# Patient Record
Sex: Male | Born: 1970 | Race: White | Hispanic: No | Marital: Married | Smoking: Current every day smoker
Health system: Southern US, Community
[De-identification: ages and names within clinical notes are randomized; demographics above are authoritative.]

## PROBLEM LIST (undated history)

## (undated) DIAGNOSIS — E7800 Pure hypercholesterolemia, unspecified: Secondary | ICD-10-CM

## (undated) DIAGNOSIS — Q2112 Patent foramen ovale: Secondary | ICD-10-CM

## (undated) DIAGNOSIS — Q211 Atrial septal defect: Secondary | ICD-10-CM

## (undated) DIAGNOSIS — F329 Major depressive disorder, single episode, unspecified: Secondary | ICD-10-CM

## (undated) DIAGNOSIS — I639 Cerebral infarction, unspecified: Secondary | ICD-10-CM

## (undated) DIAGNOSIS — F17200 Nicotine dependence, unspecified, uncomplicated: Secondary | ICD-10-CM

## (undated) DIAGNOSIS — F32A Depression, unspecified: Secondary | ICD-10-CM

## (undated) HISTORY — DX: Major depressive disorder, single episode, unspecified: F32.9

## (undated) HISTORY — PX: VASECTOMY: SHX75

## (undated) HISTORY — DX: Depression, unspecified: F32.A

## (undated) HISTORY — PX: MOUTH SURGERY: SHX715

## (undated) HISTORY — PX: OTHER SURGICAL HISTORY: SHX169

## (undated) HISTORY — PX: CARPAL TUNNEL RELEASE: SHX101

## (undated) HISTORY — DX: Cerebral infarction, unspecified: I63.9

## (undated) HISTORY — DX: Nicotine dependence, unspecified, uncomplicated: F17.200

---

## 2000-09-05 ENCOUNTER — Emergency Department (HOSPITAL_COMMUNITY): Admission: EM | Admit: 2000-09-05 | Discharge: 2000-09-06 | Payer: Self-pay | Admitting: Emergency Medicine

## 2001-09-19 ENCOUNTER — Emergency Department (HOSPITAL_COMMUNITY): Admission: EM | Admit: 2001-09-19 | Discharge: 2001-09-19 | Payer: Self-pay

## 2002-06-27 ENCOUNTER — Emergency Department (HOSPITAL_COMMUNITY): Admission: EM | Admit: 2002-06-27 | Discharge: 2002-06-27 | Payer: Self-pay | Admitting: Emergency Medicine

## 2002-06-27 ENCOUNTER — Encounter: Payer: Self-pay | Admitting: *Deleted

## 2007-01-09 ENCOUNTER — Ambulatory Visit: Payer: Self-pay | Admitting: Internal Medicine

## 2007-02-13 ENCOUNTER — Encounter: Payer: Self-pay | Admitting: Nurse Practitioner

## 2007-02-13 DIAGNOSIS — D239 Other benign neoplasm of skin, unspecified: Secondary | ICD-10-CM | POA: Insufficient documentation

## 2008-06-09 ENCOUNTER — Emergency Department (HOSPITAL_COMMUNITY): Admission: EM | Admit: 2008-06-09 | Discharge: 2008-06-09 | Payer: Self-pay | Admitting: Emergency Medicine

## 2008-12-09 ENCOUNTER — Emergency Department (HOSPITAL_COMMUNITY): Admission: EM | Admit: 2008-12-09 | Discharge: 2008-12-09 | Payer: Self-pay | Admitting: Emergency Medicine

## 2011-03-28 LAB — COMPREHENSIVE METABOLIC PANEL
ALT: 31 U/L (ref 0–53)
AST: 21 U/L (ref 0–37)
Albumin: 4.1 g/dL (ref 3.5–5.2)
Calcium: 9.3 mg/dL (ref 8.4–10.5)
GFR calc Af Amer: >60 mL/min (ref >60)
Sodium: 136 mEq/L (ref 135–145)
Total Protein: 7.7 g/dL (ref 6.0–8.3)

## 2011-03-28 LAB — URINALYSIS, ROUTINE W REFLEX MICROSCOPIC
Bilirubin Urine: NEGATIVE
Glucose, UA: NEGATIVE mg/dL
Hgb urine dipstick: NEGATIVE
Ketones, ur: NEGATIVE mg/dL
Nitrite: NEGATIVE
Protein, ur: NEGATIVE mg/dL
Specific Gravity, Urine: 1.007 (ref 1.005–1.030)
Urobilinogen, UA: 0.2 mg/dL (ref 0.0–1.0)
pH: 7 (ref 5.0–8.0)

## 2011-03-28 LAB — OCCULT BLOOD X 1 CARD TO LAB, STOOL: Fecal Occult Bld: POSITIVE

## 2011-03-28 LAB — CBC
MCHC: 34 g/dL (ref 30.0–36.0)
RDW: 12.2 % (ref 11.5–15.5)

## 2014-11-10 ENCOUNTER — Other Ambulatory Visit: Payer: Self-pay | Admitting: Orthopedic Surgery

## 2014-11-10 ENCOUNTER — Encounter (HOSPITAL_COMMUNITY)
Admission: RE | Admit: 2014-11-10 | Discharge: 2014-11-10 | Disposition: A | Discharge status: Home / Self Care | Payer: BLUE CROSS/BLUE SHIELD | Source: Ambulatory Visit | Attending: Orthopedic Surgery | Admitting: Orthopedic Surgery

## 2014-11-10 DIAGNOSIS — B999 Unspecified infectious disease: Secondary | ICD-10-CM | POA: Insufficient documentation

## 2014-11-10 MED ORDER — VANCOMYCIN HCL IN DEXTROSE 1-5 GM/200ML-% IV SOLN
1000.0000 mg | Freq: Once | INTRAVENOUS | Status: DC
  Administered 2014-11-10: 1000 mg via INTRAVENOUS

## 2014-11-11 ENCOUNTER — Encounter (HOSPITAL_COMMUNITY)
Admission: RE | Admit: 2014-11-11 | Discharge: 2014-11-11 | Disposition: A | Discharge status: Home / Self Care | Payer: BLUE CROSS/BLUE SHIELD | Source: Ambulatory Visit | Attending: Orthopedic Surgery | Admitting: Orthopedic Surgery

## 2014-11-11 DIAGNOSIS — B999 Unspecified infectious disease: Secondary | ICD-10-CM | POA: Diagnosis not present

## 2014-11-11 MED ORDER — VANCOMYCIN HCL IN DEXTROSE 1-5 GM/200ML-% IV SOLN
1000.0000 mg | Freq: Once | INTRAVENOUS | Status: AC
  Administered 2014-11-11: 1000 mg via INTRAVENOUS

## 2017-06-24 DIAGNOSIS — I639 Cerebral infarction, unspecified: Secondary | ICD-10-CM

## 2017-06-24 HISTORY — DX: Cerebral infarction, unspecified: I63.9

## 2017-06-26 ENCOUNTER — Encounter (HOSPITAL_COMMUNITY): Payer: Self-pay | Admitting: Emergency Medicine

## 2017-06-26 ENCOUNTER — Observation Stay (HOSPITAL_COMMUNITY)
Admission: EM | Admit: 2017-06-26 | Discharge: 2017-06-28 | Disposition: A | Discharge status: Home / Self Care | Payer: 59 | Attending: Internal Medicine | Admitting: Internal Medicine

## 2017-06-26 ENCOUNTER — Emergency Department (HOSPITAL_COMMUNITY): Payer: 59

## 2017-06-26 DIAGNOSIS — F418 Other specified anxiety disorders: Secondary | ICD-10-CM | POA: Diagnosis not present

## 2017-06-26 DIAGNOSIS — R030 Elevated blood-pressure reading, without diagnosis of hypertension: Secondary | ICD-10-CM | POA: Diagnosis not present

## 2017-06-26 DIAGNOSIS — F17210 Nicotine dependence, cigarettes, uncomplicated: Secondary | ICD-10-CM | POA: Diagnosis not present

## 2017-06-26 DIAGNOSIS — I639 Cerebral infarction, unspecified: Principal | ICD-10-CM | POA: Diagnosis present

## 2017-06-26 DIAGNOSIS — Z79899 Other long term (current) drug therapy: Secondary | ICD-10-CM | POA: Insufficient documentation

## 2017-06-26 DIAGNOSIS — R4182 Altered mental status, unspecified: Secondary | ICD-10-CM | POA: Diagnosis present

## 2017-06-26 DIAGNOSIS — Z8673 Personal history of transient ischemic attack (TIA), and cerebral infarction without residual deficits: Secondary | ICD-10-CM | POA: Insufficient documentation

## 2017-06-26 DIAGNOSIS — R410 Disorientation, unspecified: Secondary | ICD-10-CM | POA: Diagnosis not present

## 2017-06-26 HISTORY — DX: Pure hypercholesterolemia, unspecified: E78.00

## 2017-06-26 LAB — CBC
HCT: 50.7 % (ref 39.0–52.0)
HEMOGLOBIN: 17.9 g/dL — AB (ref 13.0–17.0)
MCH: 33 pg (ref 26.0–34.0)
MCHC: 35.3 g/dL (ref 30.0–36.0)
MCV: 93.4 fL (ref 78.0–100.0)
Platelets: 208 10*3/uL (ref 150–400)
RBC: 5.43 MIL/uL (ref 4.22–5.81)
RDW: 12.6 % (ref 11.5–15.5)
WBC: 7.8 10*3/uL (ref 4.0–10.5)

## 2017-06-26 LAB — COMPREHENSIVE METABOLIC PANEL
ALBUMIN: 3.9 g/dL (ref 3.5–5.0)
ALK PHOS: 78 U/L (ref 38–126)
ALT: 29 U/L (ref 17–63)
ANION GAP: 7 (ref 5–15)
AST: 30 U/L (ref 15–41)
BUN: 12 mg/dL (ref 6–20)
CHLORIDE: 105 mmol/L (ref 101–111)
CO2: 22 mmol/L (ref 22–32)
Calcium: 8.8 mg/dL — ABNORMAL LOW (ref 8.9–10.3)
Creatinine, Ser: 1.05 mg/dL (ref 0.61–1.24)
GFR calc non Af Amer: >60 mL/min (ref >60)
GLUCOSE: 102 mg/dL — AB (ref 65–99)
Potassium: 4.3 mmol/L (ref 3.5–5.1)
SODIUM: 134 mmol/L — AB (ref 135–145)
Total Bilirubin: 1.1 mg/dL (ref 0.3–1.2)
Total Protein: 7.3 g/dL (ref 6.5–8.1)

## 2017-06-26 LAB — CBG MONITORING, ED: Glucose-Capillary: 132 mg/dL — ABNORMAL HIGH (ref 65–99)

## 2017-06-26 NOTE — ED Provider Notes (Signed)
[place] DEPT Provider Note   CSN: [id number] Arrival date & time: 06/26/17  1557     History   Chief Complaint Chief Complaint  Patient presents with  . Altered Mental Status    HPI Steven Herrera is a 47 y.o. male.  HPI Pt is worried that he had a stroke.  He noticed the symptoms yesterday around 10am.  He has been having difficulty with fine motor skills and depth perception.  For example he was trying to put something on the table and he kept reaching past it or below the table. 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 He felt like his speech was off yesterday but none today.  No vision issues.   Past Medical History:  Diagnosis Date  . High cholesterol     Patient Active Problem List   Diagnosis Date Noted  . NEVUS 02/13/2007    Past Surgical History:  Procedure Laterality Date  . CARPAL TUNNEL RELEASE    . MOUTH SURGERY    . spider bite    . VASECTOMY         Home Medications    Prior to Admission medications   Not on File    Family History No family history on file.  Social History Social History   Tobacco Use  . Smoking status: Current Every Day Smoker    Types: Cigarettes  . Smokeless tobacco: Never Used  Substance Use Topics  . Alcohol use: Yes  . Drug use: Not on file     Allergies   Penicillins   Review of Systems Review of Systems  All other systems reviewed and are negative.    Physical Exam Updated Vital Signs BP (!) 142/105 (BP Location: Right Arm)   Pulse 63   Temp 98.3 F (36.8 C) (Oral)   Resp 14   Ht 1.854 m (6\' 1" )   Wt 97.5 kg (215 lb)   SpO2 96%   BMI 28.37 kg/m   Physical Exam  Constitutional: He is oriented to person, place, and time. He appears well-developed and well-nourished. No distress.  HENT:  Head: Normocephalic and atraumatic.  Right Ear: External ear normal.  Left Ear: External ear normal.  Mouth/Throat: Oropharynx is clear and moist.    Eyes: Conjunctivae are normal. Right eye exhibits no discharge. Left eye exhibits no discharge. No scleral icterus.  Neck: Neck supple. No tracheal deviation present.  Cardiovascular: Normal rate, regular rhythm and intact distal pulses.  Pulmonary/Chest: Effort normal and breath sounds normal. No stridor. No respiratory distress. He has no wheezes. He has no rales.  Abdominal: Soft. Bowel sounds are normal. He exhibits no distension. There is no tenderness. There is no rebound and no guarding.  Musculoskeletal: He exhibits no edema or tenderness.  Neurological: He is alert and oriented to person, place, and time. He has normal strength. No cranial nerve deficit (No facial droop, extraocular movements intact, tongue midline ) or sensory deficit. He exhibits normal muscle tone. He displays no seizure activity. Coordination normal.  No pronator drift bilateral upper extrem, able to hold both legs off bed for 5 seconds, sensation intact in all extremities, no visual field cuts, no left or right sided neglect, normal finger-nose exam bilaterally, no nystagmus noted   Skin: Skin is warm and dry. No rash noted.  Psychiatric: He has a normal mood and affect.  Nursing note and vitals reviewed.    ED Treatments / Results  Labs (all labs  ordered are listed, but only abnormal results are displayed) Labs Reviewed  COMPREHENSIVE METABOLIC PANEL - Abnormal; Notable for the following components:      Result Value   Sodium 134 (*)    Glucose, Bld 102 (*)    Calcium 8.8 (*)    All other components within normal limits  CBC - Abnormal; Notable for the following components:   Hemoglobin 17.9 (*)    All other components within normal limits  CBG MONITORING, ED - Abnormal; Notable for the following components:   Glucose-Capillary 132 (*)    All other components within normal limits  ETHANOL    Radiology Ct Head Wo Contrast  Result Date: 06/26/2017 CLINICAL DATA:  Confusion.  Difficult hand eye  coordination.  TIA. EXAM: CT HEAD WITHOUT CONTRAST TECHNIQUE: Contiguous axial images were obtained from the base of the skull through the vertex without intravenous contrast. COMPARISON:  None. FINDINGS: Brain: Vague low-attenuation change demonstrated in the caudate and globus pallidus with extension to the subinsular region on the left. This suggests early changes of acute infarct. MRI may provide additional characterization if clinically indicated. 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 No mass effect or midline shift. No abnormal extra-axial fluid collections. No ventricular dilatation. No acute intracranial hemorrhage. Vascular: There may be slightly asymmetrical increased density in the left middle cerebral artery. This could indicate vascular thrombosis. Skull: Normal. Negative for fracture or focal lesion. Sinuses/Orbits: No acute finding. Other: None. IMPRESSION: 1. A low-attenuation changes in the left basal ganglia and subinsular region with possible slightly increased asymmetrical density in the left middle cerebral artery. Changes suggest acute ischemia with possible middle cerebral artery vascular thrombosis. MRI may provide additional characterization if clinically indicated. 2. No acute intracranial hemorrhage. 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 Electronically Signed   By: Lucienne Capers M.D.   On: 06/26/2017 22:48    Procedures Procedures (including critical care time)  Medications Ordered in ED Medications - No data to display   Initial Impression / Assessment and Plan / ED Course  I have reviewed the triage vital signs and the nursing notes.  Pertinent labs & imaging results that were available during my care of the patient were reviewed by me and considered in my medical decision making (see chart for details).   Patient presented to the emergency room for evaluation of difficulty with his coordination yesterday.  Patient's symptoms seem to improve however  they did not completely resolved.  He was worried that he might of had a stroke.  Patient's CAT scan shows an old stroke as well as a probable new stroke.  He is well outside any acute treatment window.  I will consult with medical service for admission and further treatment.  Final Clinical Impressions(s) / ED Diagnoses   Final diagnoses:  Cerebrovascular accident (CVA), unspecified mechanism ([place])      Dorie Rank, MD 06/26/17 2328

## 2017-06-26 NOTE — ED Triage Notes (Signed)
Patient reports that yesterday woke up around 10am and was fine then went back to sleep and woke up around noon and had confusion and trouble using cell phone and hand eye coordination.  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

## 2017-06-27 ENCOUNTER — Other Ambulatory Visit: Payer: Self-pay

## 2017-06-27 ENCOUNTER — Observation Stay (HOSPITAL_BASED_OUTPATIENT_CLINIC_OR_DEPARTMENT_OTHER): Payer: 59

## 2017-06-27 ENCOUNTER — Observation Stay (HOSPITAL_COMMUNITY): Payer: 59

## 2017-06-27 ENCOUNTER — Encounter (HOSPITAL_COMMUNITY): Payer: Self-pay | Admitting: Family Medicine

## 2017-06-27 ENCOUNTER — Observation Stay (HOSPITAL_COMMUNITY): Admit: 2017-06-27 | Payer: 59

## 2017-06-27 DIAGNOSIS — I639 Cerebral infarction, unspecified: Secondary | ICD-10-CM

## 2017-06-27 DIAGNOSIS — Z79899 Other long term (current) drug therapy: Secondary | ICD-10-CM | POA: Diagnosis not present

## 2017-06-27 DIAGNOSIS — R030 Elevated blood-pressure reading, without diagnosis of hypertension: Secondary | ICD-10-CM | POA: Diagnosis not present

## 2017-06-27 DIAGNOSIS — F418 Other specified anxiety disorders: Secondary | ICD-10-CM | POA: Diagnosis not present

## 2017-06-27 DIAGNOSIS — Z8673 Personal history of transient ischemic attack (TIA), and cerebral infarction without residual deficits: Secondary | ICD-10-CM | POA: Diagnosis not present

## 2017-06-27 DIAGNOSIS — F17210 Nicotine dependence, cigarettes, uncomplicated: Secondary | ICD-10-CM | POA: Diagnosis not present

## 2017-06-27 DIAGNOSIS — R410 Disorientation, unspecified: Secondary | ICD-10-CM | POA: Diagnosis not present

## 2017-06-27 LAB — LIPID PANEL
CHOLESTEROL: 204 mg/dL — AB (ref 0–200)
HDL: 25 mg/dL — AB (ref >40)
LDL Cholesterol: UNDETERMINED mg/dL (ref 0–99)
TRIGLYCERIDES: 410 mg/dL — AB (ref <150)
Total CHOL/HDL Ratio: 8.2 RATIO
VLDL: UNDETERMINED mg/dL (ref 0–40)

## 2017-06-27 LAB — ETHANOL

## 2017-06-27 LAB — HEMOGLOBIN A1C
HEMOGLOBIN A1C: 5.1 % (ref 4.8–5.6)
Mean Plasma Glucose: 99.67 mg/dL

## 2017-06-27 MED ORDER — ACETAMINOPHEN 650 MG RE SUPP: 650.0000 mg | RECTAL | Status: DC | PRN

## 2017-06-27 MED ORDER — ENOXAPARIN SODIUM 40 MG/0.4ML ~~LOC~~ SOLN
40.0000 mg | SUBCUTANEOUS | Status: DC
  Administered 2017-06-27 – 2017-06-28 (×2): 40 mg via SUBCUTANEOUS
  Filled 2017-06-27 (×2): qty 0.4

## 2017-06-27 MED ORDER — STROKE: EARLY STAGES OF RECOVERY BOOK
Freq: Once | Status: AC
  Administered 2017-06-27: 03:00:00
  Filled 2017-06-27: qty 1

## 2017-06-27 MED ORDER — ASPIRIN 325 MG PO TABS
325.0000 mg | ORAL_TABLET | Freq: Every day | ORAL | Status: DC
  Administered 2017-06-27 – 2017-06-28 (×3): 325 mg via ORAL
  Filled 2017-06-27 (×3): qty 1

## 2017-06-27 MED ORDER — SENNOSIDES-DOCUSATE SODIUM 8.6-50 MG PO TABS: 1.0000 | ORAL_TABLET | Freq: Every evening | ORAL | Status: DC | PRN

## 2017-06-27 MED ORDER — CLONAZEPAM 1 MG PO TABS
1.0000 mg | ORAL_TABLET | Freq: Two times a day (BID) | ORAL | Status: DC
  Administered 2017-06-27 – 2017-06-28 (×3): 1 mg via ORAL
  Filled 2017-06-27: qty 1
  Filled 2017-06-27: qty 2
  Filled 2017-06-27: qty 1

## 2017-06-27 MED ORDER — SODIUM CHLORIDE 0.9 % IV SOLN
INTRAVENOUS | Status: DC
  Administered 2017-06-27: 01:00:00 via INTRAVENOUS

## 2017-06-27 MED ORDER — ATORVASTATIN CALCIUM 80 MG PO TABS
80.0000 mg | ORAL_TABLET | Freq: Every day | ORAL | Status: DC
  Administered 2017-06-27: 80 mg via ORAL
  Filled 2017-06-27: qty 1

## 2017-06-27 MED ORDER — ACETAMINOPHEN 160 MG/5ML PO SOLN: 650.0000 mg | ORAL | Status: DC | PRN

## 2017-06-27 MED ORDER — ACETAMINOPHEN 325 MG PO TABS: 650.0000 mg | ORAL_TABLET | ORAL | Status: DC | PRN

## 2017-06-27 MED ORDER — PAROXETINE HCL 20 MG PO TABS
40.0000 mg | ORAL_TABLET | Freq: Every day | ORAL | Status: DC
  Administered 2017-06-27 – 2017-06-28 (×2): 40 mg via ORAL
  Filled 2017-06-27 (×2): qty 2

## 2017-06-27 MED ORDER — BUPROPION HCL ER (XL) 150 MG PO TB24
150.0000 mg | ORAL_TABLET | Freq: Every day | ORAL | Status: DC
  Administered 2017-06-27 – 2017-06-28 (×2): 150 mg via ORAL
  Filled 2017-06-27 (×2): qty 1

## 2017-06-27 NOTE — Consult Note (Signed)
Requesting Physician: Dr. Zigmund Daniel    Chief Complaint: Strokelike symptoms  History obtained from:  Patient     HPI:                                                                                                                                         Steven Herrera is an 47 y.o. male with history of hypercholesterolemia, carpal tunnel, depression and anxiety.  Patient presented to the emergency department on 3 January of 2018 for difficulty with hand eye coordination.  She apparently took a nap on 26 June 2007 but upon waking he had some confusion, he noticed some difficulty using the cell phone as far as difficulty pushing the numbers and using it correctly.  However this did make him nervous as his family has a history of strokes that she reported to the emergency department.  Upon entering the patient's blood pressure was stable, chemistry panel and CBC were stable however CT of the head demonstrated a low-attenuation in the left basal ganglia which was suspicious for a possible acute ischemic infarct.  This is followed up by an MRI of the brain which did not show any acute/subacute nonhemorrhagic infarct involving the left lentiform nucleus and internal capsule along with a left caudate head.  MRA was nonrevealing.  Patient's LDL was unable to be calculated secondary to elevated triglycerides.   Date last known well: Date: 06/25/2017 Time last known well: Time: 10:00 tPA Given: No: Out of the window   Modified Rankin: Rankin Score=0    Past Medical History:  Diagnosis Date  . High cholesterol     Past Surgical History:  Procedure Laterality Date  . CARPAL TUNNEL RELEASE    . MOUTH SURGERY    . spider bite    . VASECTOMY      Family History  Problem Relation Age of Onset  . Stroke Father        disabling CVA in early-mid 50's   Social History:  reports that he has been smoking cigarettes.  he has never used smokeless tobacco. He reports that he drinks alcohol. His drug  history is not on file.  Allergies:  Allergies  Allergen Reactions  . Ibuprofen Diarrhea  . Penicillins     Has patient had a PCN reaction causing immediate rash, facial/tongue/throat swelling, SOB or lightheadedness with hypotension: Yes Has patient had a PCN reaction causing severe rash involving mucus membranes or skin necrosis: no Has patient had a PCN reaction that required hospitalization: no Has patient had a PCN reaction occurring within the last 10 years: no If all of the above answers are "NO", then may proceed with Cephalosporin use.    Medications:  Current Facility-Administered Medications  Medication Dose Route Frequency Provider Last Rate Last Dose  . 0.9 %  sodium chloride infusion   Intravenous Continuous Opyd, Ilene Qua, MD 100 mL/hr at 06/27/17 0103    . acetaminophen (TYLENOL) tablet 650 mg  650 mg Oral Q4H PRN Opyd, Ilene Qua, MD       Or  . acetaminophen (TYLENOL) solution 650 mg  650 mg Per Tube Q4H PRN Opyd, Ilene Qua, MD       Or  . acetaminophen (TYLENOL) suppository 650 mg  650 mg Rectal Q4H PRN Opyd, Ilene Qua, MD      . aspirin tablet 325 mg  325 mg Oral Daily Opyd, Ilene Qua, MD   325 mg at 06/27/17 1056  . buPROPion (WELLBUTRIN XL) 24 hr tablet 150 mg  150 mg Oral Daily Opyd, Ilene Qua, MD   150 mg at 06/27/17 1057  . clonazePAM (KLONOPIN) tablet 1 mg  1 mg Oral BID Opyd, Ilene Qua, MD   1 mg at 06/27/17 1056  . enoxaparin (LOVENOX) injection 40 mg  40 mg Subcutaneous Q24H Opyd, Ilene Qua, MD   40 mg at 06/27/17 0850  . PARoxetine (PAXIL) tablet 40 mg  40 mg Oral Daily Opyd, Ilene Qua, MD   40 mg at 06/27/17 1056  . senna-docusate (Senokot-S) tablet 1 tablet  1 tablet Oral QHS PRN Opyd, Ilene Qua, MD       Current Outpatient Medications  Medication Sig Dispense Refill  . buPROPion (WELLBUTRIN XL) 150 MG 24 hr tablet Take 150 mg by  mouth daily. noonish    . clonazePAM (KLONOPIN) 1 MG tablet Take 1 mg by mouth 2 (two) times daily.    Marland Kitchen PARoxetine (PAXIL) 40 MG tablet Take 40 mg by mouth daily.  0     ROS:                                                                                                                                       History obtained from the patient  General ROS: negative for - chills, fatigue, fever, night sweats, weight gain or weight loss Psychological ROS: negative for - behavioral disorder, hallucinations, memory difficulties, mood swings or suicidal ideation Ophthalmic ROS: negative for - blurry vision, double vision, eye pain or loss of vision ENT ROS: negative for - epistaxis, nasal discharge, oral lesions, sore throat, tinnitus or vertigo Allergy and Immunology ROS: negative for - hives or itchy/watery eyes Hematological and Lymphatic ROS: negative for - bleeding problems, bruising or swollen lymph nodes Endocrine ROS: negative for - galactorrhea, hair pattern changes, polydipsia/polyuria or temperature intolerance Respiratory ROS: negative for - cough, hemoptysis, shortness of breath or wheezing Cardiovascular ROS: negative for - chest pain, dyspnea on exertion, edema or irregular heartbeat Gastrointestinal ROS: negative for - abdominal pain, diarrhea, hematemesis, nausea/vomiting or stool incontinence Genito-Urinary ROS: negative for - dysuria, hematuria, incontinence or urinary frequency/urgency  Musculoskeletal ROS: negative for - joint swelling or muscular weakness Neurological ROS: as noted in HPI Dermatological ROS: negative for rash and skin lesion changes  Neurologic Examination:                                                                                                      Blood pressure 121/86, pulse 60, temperature 98.3 F (36.8 C), resp. rate 16, height 6\' 1"  (1.854 m), weight 97.5 kg (215 lb), SpO2 100 %.  HEENT-  Normocephalic, no lesions, without obvious abnormality.   Normal external eye and conjunctiva.  Normal TM's bilaterally.  Normal auditory canals and external ears. Normal external nose, mucus membranes and septum.  Normal pharynx. Cardiovascular- S1, S2 normal, pulses palpable throughout   Lungs- chest clear, no wheezing, rales, normal symmetric air entry Abdomen- normal findings: bowel sounds normal Extremities- no edema Lymph-no adenopathy palpable Musculoskeletal-no joint tenderness, deformity or swelling Skin-warm and dry, no hyperpigmentation, vitiligo, or suspicious lesions  Neurological Examination Mental Status: Alert, oriented, thought content appropriate.  Speech fluent without evidence of aphasia.  Able to follow 3 step commands without difficulty. Cranial Nerves: II: Discs flat bilaterally; Visual fields grossly normal,  III,IV, VI: ptosis not present, extra-ocular motions intact bilaterally, pupils equal, round, reactive to light and accommodation V,VII: smile symmetric, facial light touch sensation normal bilaterally VIII: hearing normal bilaterally IX,X: uvula rises symmetrically XI: bilateral shoulder shrug XII: midline tongue extension Motor: Right : Upper extremity   5/5    Left:     Upper extremity   5/5  Lower extremity   5/5     Lower extremity   5/5 Tone and bulk:normal tone throughout; no atrophy noted Sensory: Pinprick and light touch intact throughout, bilaterally Deep Tendon Reflexes: 2+ and symmetric throughout Plantars: Right: downgoing   Left: downgoing Cerebellar: normal finger-to-nose, normal rapid alternating movements and normal heel-to-shin test Gait: normal gait and station       Lab Results: Basic Metabolic Panel: Recent Labs  Lab 06/26/17 1631  NA 134*  K 4.3  CL 105  CO2 22  GLUCOSE 102*  BUN 12  CREATININE 1.05  CALCIUM 8.8*    Liver Function Tests: Recent Labs  Lab 06/26/17 1631  AST 30  ALT 29  ALKPHOS 78  BILITOT 1.1  PROT 7.3  ALBUMIN 3.9   No results for input(s):  LIPASE, AMYLASE in the last 168 hours. No results for input(s): AMMONIA in the last 168 hours.  CBC: Recent Labs  Lab 06/26/17 1631  WBC 7.8  HGB 17.9*  HCT 50.7  MCV 93.4  PLT 208    Cardiac Enzymes: No results for input(s): CKTOTAL, CKMB, CKMBINDEX, TROPONINI in the last 168 hours.  Lipid Panel: Recent Labs  Lab 06/27/17 0843  CHOL 204*  TRIG 410*  HDL 25*  CHOLHDL 8.2  VLDL UNABLE TO CALCULATE IF TRIGLYCERIDE OVER 400 mg/dL  LDLCALC UNABLE TO CALCULATE IF TRIGLYCERIDE OVER 400 mg/dL    CBG: Recent Labs  Lab 06/26/17 1611  GLUCAP 132*    Microbiology: No results found for this or any  previous visit.  Coagulation Studies: No results for input(s): LABPROT, INR in the last 72 hours.  Imaging: Ct Head Wo Contrast  Result Date: 06/26/2017 CLINICAL DATA:  Confusion.  Difficult hand eye coordination.  TIA. EXAM: CT HEAD WITHOUT CONTRAST TECHNIQUE: Contiguous axial images were obtained from the base of the skull through the vertex without intravenous contrast. COMPARISON:  None. FINDINGS: Brain: Vague low-attenuation change demonstrated in the caudate and globus pallidus with extension to the subinsular region on the left. This suggests early changes of acute infarct. MRI may provide additional characterization if clinically indicated. 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 No mass effect or midline shift. No abnormal extra-axial fluid collections. No ventricular dilatation. No acute intracranial hemorrhage. Vascular: There may be slightly asymmetrical increased density in the left middle cerebral artery. This could indicate vascular thrombosis. Skull: Normal. Negative for fracture or focal lesion. Sinuses/Orbits: No acute finding. Other: None. IMPRESSION: 1. A low-attenuation changes in the left basal ganglia and subinsular region with possible slightly increased asymmetrical density in the left middle cerebral artery. Changes suggest acute ischemia with possible middle  cerebral artery vascular thrombosis. MRI may provide additional characterization if clinically indicated. 2. No acute intracranial hemorrhage. 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 Electronically Signed   By: Lucienne Capers M.D.   On: 06/26/2017 22:48   Mr Brain Wo Contrast  Result Date: 06/27/2017 CLINICAL DATA:  Neuro deficits, subacute. New onset confusion and abnormal coordination. EXAM: MRI HEAD WITHOUT CONTRAST MRA HEAD WITHOUT CONTRAST TECHNIQUE: Multiplanar, multiecho pulse sequences of the brain and surrounding structures were obtained without intravenous contrast. Angiographic images of the head were obtained using MRA technique without contrast. COMPARISON:  CT head without contrast. FINDINGS: MRI HEAD FINDINGS Brain: The diffusion-weighted images confirm an acute/ subacute infarct involving the left lentiform nucleus and caudate head. The infarct crosses the left internal capsule. No acute hemorrhage or mass lesion is present. Mild periventricular white matter changes are present on the right, slightly advanced for age. No other infarcts are present. The internal auditory canals are within normal limits bilaterally. The brainstem and cerebellum are normal. Vascular: Flow is present in the major intracranial arteries. Skull and upper cervical spine: The skullbase is within normal limits. The craniocervical junction is normal. Marrow signal is within normal limits. Sinuses/Orbits: The paranasal sinuses and mastoid air cells are clear. Globes and orbits are within normal limits. MRA HEAD FINDINGS The internal carotid arteries are within normal limits from the high cervical segments through the ICA termini bilaterally. The right A1 segment is aplastic. Both ACA territories fill from the left. The left A1 segment is normal. M1 segments are normal bilaterally. The MCA bifurcations are intact. ACA and MCA branch vessels are normal. The left vertebral artery is slightly dominant to the right. The  left PICA origin is visualized and normal. The right AICA is dominant. The basilar artery is normal. The left posterior cerebral artery originates from the basilar tip. The right posterior cerebral artery is of fetal type with a small right P1 segment. PCA branch vessels are normal bilaterally. IMPRESSION: 1. Acute/subacute nonhemorrhagic infarct involving the left lentiform nucleus, left internal capsule, and left caudate head. 2. Mild periventricular white matter changes are also noted, slightly advanced for age. 3. No other discrete remote infarcts for white matter disease. 4. Normal variant MRA circle of Willis without significant proximal stenosis, aneurysm, or branch vessel occlusion. Electronically Signed   By: San Morelle M.D.   On: 06/27/2017 07:59  Mr Jodene Nam Head Wo Contrast  Result Date: 06/27/2017 CLINICAL DATA:  Neuro deficits, subacute. New onset confusion and abnormal coordination. EXAM: MRI HEAD WITHOUT CONTRAST MRA HEAD WITHOUT CONTRAST TECHNIQUE: Multiplanar, multiecho pulse sequences of the brain and surrounding structures were obtained without intravenous contrast. Angiographic images of the head were obtained using MRA technique without contrast. COMPARISON:  CT head without contrast. FINDINGS: MRI HEAD FINDINGS Brain: The diffusion-weighted images confirm an acute/ subacute infarct involving the left lentiform nucleus and caudate head. The infarct crosses the left internal capsule. No acute hemorrhage or mass lesion is present. Mild periventricular white matter changes are present on the right, slightly advanced for age. No other infarcts are present. The internal auditory canals are within normal limits bilaterally. The brainstem and cerebellum are normal. Vascular: Flow is present in the major intracranial arteries. Skull and upper cervical spine: The skullbase is within normal limits. The craniocervical junction is normal. Marrow signal is within normal limits. Sinuses/Orbits: The  paranasal sinuses and mastoid air cells are clear. Globes and orbits are within normal limits. MRA HEAD FINDINGS The internal carotid arteries are within normal limits from the high cervical segments through the ICA termini bilaterally. The right A1 segment is aplastic. Both ACA territories fill from the left. The left A1 segment is normal. M1 segments are normal bilaterally. The MCA bifurcations are intact. ACA and MCA branch vessels are normal. The left vertebral artery is slightly dominant to the right. The left PICA origin is visualized and normal. The right AICA is dominant. The basilar artery is normal. The left posterior cerebral artery originates from the basilar tip. The right posterior cerebral artery is of fetal type with a small right P1 segment. PCA branch vessels are normal bilaterally. IMPRESSION: 1. Acute/subacute nonhemorrhagic infarct involving the left lentiform nucleus, left internal capsule, and left caudate head. 2. Mild periventricular white matter changes are also noted, slightly advanced for age. 3. No other discrete remote infarcts for white matter disease. 4. Normal variant MRA circle of Willis without significant proximal stenosis, aneurysm, or branch vessel occlusion. Electronically Signed   By: San Morelle M.D.   On: 06/27/2017 07:59       Assessment and plan discussed with with attending physician and they are in agreement.    Etta Quill PA-C [facility] [phone]  06/27/2017, 11:29 AM   Assessment: 47 y.o. male with acute/subacute stroke. He has risk factors of smoking and hyperlipidemia. currently has a right facial droop otherwise negative.   Stroke Risk Factors - hyperlipidemia    Recommend 1. HgbA1c, fasting lipid panel 3. PT consult, OT consult, Speech consult 4. Echocardiogram 5. 80 mg of Atorvistatin 6. Prophylactic therapy-Antiplatelet med: ASA 81 mg daily 7. Risk factor modification 8. Telemetry monitoring 9. Frequent neuro  checks 10 NPO until passes stroke swallow screen 11 please page stroke NP  Or  PA  Or MD from 8am -4 pm  as this patient from this time will be  followed by the stroke.   You can look them up on [email or web address]  Password [email or web address]    NEUROHOSPITALIST ADDENDUM Seen and examined the patient today. Formulated plan as documented above by PAC/Resident.  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 I don't think this is parent vessel athero as I dont see MCA narrowing on MRA brain. Multiple risk factors for small vessel disease such as HTN, HLD, Tobacco. Not convinced he needs extensive stroke in young workup.     Karena Addison Mathias Bogacki MD  [facility] [phone]  If 7pm to 7am, please call on call as listed on AMION.

## 2017-06-27 NOTE — ED Notes (Signed)
Patient currently still in MRI

## 2017-06-27 NOTE — Progress Notes (Signed)
Carotid duplex completed. Bilateral - No evidence of stenosis. Vertebral artery flow is antegrade. [facility], [place] 06/27/2017 5:00 PM

## 2017-06-27 NOTE — ED Notes (Signed)
ED TO INPATIENT HANDOFF REPORT  Name/Age/Gender Steven Herrera 47 y.o. male  Code Status    Code Status Orders  (From admission, onward)        Start     Ordered   06/27/17 0049  Full code  Continuous     06/27/17 0049    Code Status History    Date Active Date Inactive Code Status Order ID Comments User Context   This patient has a current code status but no historical code status.      Home/SNF/Other Home  Chief Complaint confused   Level of Care/Admitting Diagnosis ED Disposition    ED Disposition Condition Comment   Admit  Hospital Area: [place] [[id number]]  Level of Care: Telemetry [5]  Diagnosis: Ischemic stroke [facility]) [[id number]]  Admitting Physician: Vianne Bulls [[id number]]  Attending Physician: Vianne Bulls [[id number]]  PT Class (Do Not Modify): Observation [104]  PT Acc Code (Do Not Modify): Observation [10022]       Medical History Past Medical History:  Diagnosis Date  . High cholesterol     Allergies Allergies  Allergen Reactions  . Ibuprofen Diarrhea  . Penicillins     Has patient had a PCN reaction causing immediate rash, facial/tongue/throat swelling, SOB or lightheadedness with hypotension: Yes Has patient had a PCN reaction causing severe rash involving mucus membranes or skin necrosis: no Has patient had a PCN reaction that required hospitalization: no Has patient had a PCN reaction occurring within the last 10 years: no If all of the above answers are "NO", then may proceed with Cephalosporin use.    IV Location/Drains/Wounds Patient Lines/Drains/Airways Status   Active Line/Drains/Airways    Name:   Placement date:   Placement time:   Site:   Days:   Peripheral IV 06/27/17 Right Hand   06/27/17    0036    Hand   less than 1   Peripheral IV 06/27/17 Left Hand   06/27/17    0126    Hand   less than 1          Labs/Imaging Results for orders placed or performed during the hospital encounter of 06/26/17  (from the past 48 hour(s))  CBG monitoring, ED     Status: Abnormal   Collection Time: 06/26/17  4:11 PM  Result Value Ref Range   Glucose-Capillary 132 (H) 65 - 99 mg/dL  Comprehensive metabolic panel     Status: Abnormal   Collection Time: 06/26/17  4:31 PM  Result Value Ref Range   Sodium 134 (L) 135 - 145 mmol/L   Potassium 4.3 3.5 - 5.1 mmol/L   Chloride 105 101 - 111 mmol/L   CO2 22 22 - 32 mmol/L   Glucose, Bld 102 (H) 65 - 99 mg/dL   BUN 12 6 - 20 mg/dL   Creatinine, Ser 1.05 0.61 - 1.24 mg/dL   Calcium 8.8 (L) 8.9 - 10.3 mg/dL   Total Protein 7.3 6.5 - 8.1 g/dL   Albumin 3.9 3.5 - 5.0 g/dL   AST 30 15 - 41 U/L   ALT 29 17 - 63 U/L   Alkaline Phosphatase 78 38 - 126 U/L   Total Bilirubin 1.1 0.3 - 1.2 mg/dL   GFR calc non Af Amer >60 >60 mL/min   GFR calc Af Amer >60 >60 mL/min    Comment: (NOTE) The eGFR has been calculated using the CKD EPI equation. This calculation has not been validated in all clinical situations. eGFR's  persistently <60 mL/min signify possible Chronic Kidney Disease.    Anion gap 7 5 - 15  CBC     Status: Abnormal   Collection Time: 06/26/17  4:31 PM  Result Value Ref Range   WBC 7.8 4.0 - 10.5 K/uL   RBC 5.43 4.22 - 5.81 MIL/uL   Hemoglobin 17.9 (H) 13.0 - 17.0 g/dL   HCT 50.7 39.0 - 52.0 %   MCV 93.4 78.0 - 100.0 fL   MCH 33.0 26.0 - 34.0 pg   MCHC 35.3 30.0 - 36.0 g/dL   RDW 12.6 11.5 - 15.5 %   Platelets 208 150 - 400 K/uL  Ethanol     Status: None   Collection Time: 06/26/17 11:40 PM  Result Value Ref Range   Alcohol, Ethyl (B) <10 <10 mg/dL    Comment:        LOWEST DETECTABLE LIMIT FOR SERUM ALCOHOL IS 10 mg/dL FOR MEDICAL PURPOSES ONLY   Hemoglobin A1c     Status: None   Collection Time: 06/27/17  8:43 AM  Result Value Ref Range   Hgb A1c MFr Bld 5.1 4.8 - 5.6 %    Comment: (NOTE) Pre diabetes:          5.7%-6.4% Diabetes:              >6.4% Glycemic control for   <7.0% adults with diabetes    Mean Plasma Glucose  99.67 mg/dL    Comment: Performed at [facility], [address], [place], [place] [address]  Lipid panel     Status: Abnormal   Collection Time: 06/27/17  8:43 AM  Result Value Ref Range   Cholesterol 204 (H) 0 - 200 mg/dL   Triglycerides 410 (H) <150 mg/dL   HDL 25 (L) >40 mg/dL   Total CHOL/HDL Ratio 8.2 RATIO   VLDL UNABLE TO CALCULATE IF TRIGLYCERIDE OVER 400 mg/dL 0 - 40 mg/dL   LDL Cholesterol UNABLE TO CALCULATE IF TRIGLYCERIDE OVER 400 mg/dL 0 - 99 mg/dL    Comment:        Total Cholesterol/HDL:CHD Risk Coronary Heart Disease Risk Table                     Men   Women  1/2 Average Risk   3.4   3.3  Average Risk       5.0   4.4  2 X Average Risk   9.6   7.1  3 X Average Risk  23.4   11.0        Use the calculated Patient Ratio above and the CHD Risk Table to determine the patient's CHD Risk.        ATP III CLASSIFICATION (LDL):  <100     mg/dL   Optimal  100-129  mg/dL   Near or Above                    Optimal  130-159  mg/dL   Borderline  160-189  mg/dL   High  >190     mg/dL   Very High    Ct Head Wo Contrast  Result Date: 06/26/2017 CLINICAL DATA:  Confusion.  Difficult hand eye coordination.  TIA. EXAM: CT HEAD WITHOUT CONTRAST TECHNIQUE: Contiguous axial images were obtained from the base of the skull through the vertex without intravenous contrast. COMPARISON:  None. FINDINGS: Brain: Vague low-attenuation change demonstrated in the caudate and globus pallidus with extension to the subinsular region on  the left. This suggests early changes of acute infarct. MRI may provide additional characterization if clinically indicated. 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 No mass effect or midline shift. No abnormal extra-axial fluid collections. No ventricular dilatation. No acute intracranial hemorrhage. Vascular: There may be slightly asymmetrical increased density in the left middle cerebral artery. This could indicate vascular thrombosis. Skull: Normal.  Negative for fracture or focal lesion. Sinuses/Orbits: No acute finding. Other: None. IMPRESSION: 1. A low-attenuation changes in the left basal ganglia and subinsular region with possible slightly increased asymmetrical density in the left middle cerebral artery. Changes suggest acute ischemia with possible middle cerebral artery vascular thrombosis. MRI may provide additional characterization if clinically indicated. 2. No acute intracranial hemorrhage. 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 Electronically Signed   By: Lucienne Capers M.D.   On: 06/26/2017 22:48   Mr Brain Wo Contrast  Result Date: 06/27/2017 CLINICAL DATA:  Neuro deficits, subacute. New onset confusion and abnormal coordination. EXAM: MRI HEAD WITHOUT CONTRAST MRA HEAD WITHOUT CONTRAST TECHNIQUE: Multiplanar, multiecho pulse sequences of the brain and surrounding structures were obtained without intravenous contrast. Angiographic images of the head were obtained using MRA technique without contrast. COMPARISON:  CT head without contrast. FINDINGS: MRI HEAD FINDINGS Brain: The diffusion-weighted images confirm an acute/ subacute infarct involving the left lentiform nucleus and caudate head. The infarct crosses the left internal capsule. No acute hemorrhage or mass lesion is present. Mild periventricular white matter changes are present on the right, slightly advanced for age. No other infarcts are present. The internal auditory canals are within normal limits bilaterally. The brainstem and cerebellum are normal. Vascular: Flow is present in the major intracranial arteries. Skull and upper cervical spine: The skullbase is within normal limits. The craniocervical junction is normal. Marrow signal is within normal limits. Sinuses/Orbits: The paranasal sinuses and mastoid air cells are clear. Globes and orbits are within normal limits. MRA HEAD FINDINGS The internal carotid arteries are within normal limits from the high cervical segments  through the ICA termini bilaterally. The right A1 segment is aplastic. Both ACA territories fill from the left. The left A1 segment is normal. M1 segments are normal bilaterally. The MCA bifurcations are intact. ACA and MCA branch vessels are normal. The left vertebral artery is slightly dominant to the right. The left PICA origin is visualized and normal. The right AICA is dominant. The basilar artery is normal. The left posterior cerebral artery originates from the basilar tip. The right posterior cerebral artery is of fetal type with a small right P1 segment. PCA branch vessels are normal bilaterally. IMPRESSION: 1. Acute/subacute nonhemorrhagic infarct involving the left lentiform nucleus, left internal capsule, and left caudate head. 2. Mild periventricular white matter changes are also noted, slightly advanced for age. 3. No other discrete remote infarcts for white matter disease. 4. Normal variant MRA circle of Willis without significant proximal stenosis, aneurysm, or branch vessel occlusion. Electronically Signed   By: San Morelle M.D.   On: 06/27/2017 07:59   Mr Jodene Nam Head Wo Contrast  Result Date: 06/27/2017 CLINICAL DATA:  Neuro deficits, subacute. New onset confusion and abnormal coordination. EXAM: MRI HEAD WITHOUT CONTRAST MRA HEAD WITHOUT CONTRAST TECHNIQUE: Multiplanar, multiecho pulse sequences of the brain and surrounding structures were obtained without intravenous contrast. Angiographic images of the head were obtained using MRA technique without contrast. COMPARISON:  CT head without contrast. FINDINGS: MRI HEAD FINDINGS Brain: The diffusion-weighted images confirm an acute/ subacute infarct involving the left  lentiform nucleus and caudate head. The infarct crosses the left internal capsule. No acute hemorrhage or mass lesion is present. Mild periventricular white matter changes are present on the right, slightly advanced for age. No other infarcts are present. The internal auditory  canals are within normal limits bilaterally. The brainstem and cerebellum are normal. Vascular: Flow is present in the major intracranial arteries. Skull and upper cervical spine: The skullbase is within normal limits. The craniocervical junction is normal. Marrow signal is within normal limits. Sinuses/Orbits: The paranasal sinuses and mastoid air cells are clear. Globes and orbits are within normal limits. MRA HEAD FINDINGS The internal carotid arteries are within normal limits from the high cervical segments through the ICA termini bilaterally. The right A1 segment is aplastic. Both ACA territories fill from the left. The left A1 segment is normal. M1 segments are normal bilaterally. The MCA bifurcations are intact. ACA and MCA branch vessels are normal. The left vertebral artery is slightly dominant to the right. The left PICA origin is visualized and normal. The right AICA is dominant. The basilar artery is normal. The left posterior cerebral artery originates from the basilar tip. The right posterior cerebral artery is of fetal type with a small right P1 segment. PCA branch vessels are normal bilaterally. IMPRESSION: 1. Acute/subacute nonhemorrhagic infarct involving the left lentiform nucleus, left internal capsule, and left caudate head. 2. Mild periventricular white matter changes are also noted, slightly advanced for age. 3. No other discrete remote infarcts for white matter disease. 4. Normal variant MRA circle of Willis without significant proximal stenosis, aneurysm, or branch vessel occlusion. Electronically Signed   By: San Morelle M.D.   On: 06/27/2017 07:59    Pending Labs Unresulted Labs (From admission, onward)   Start     Ordered   07/04/17 0500  Creatinine, serum  (enoxaparin (LOVENOX)    CrCl >/= 30 ml/min)  Weekly,   R    Comments:  while on enoxaparin therapy    06/27/17 0049   06/27/17 0500  HIV antibody (Routine Testing)  Tomorrow morning,   R     06/27/17 0049       Vitals/Pain Today's Vitals   06/27/17 1057 06/27/17 1145 06/27/17 1230 06/27/17 1255  BP: 121/86 131/90 (!) 124/96 108/86  Pulse: 60   67  Resp: '16 15 17 16  '$ Temp:    97.6 F (36.4 C)  TempSrc:    Oral  SpO2: 100%   97%  Weight:      Height:        Isolation Precautions No active isolations  Medications Medications  buPROPion (WELLBUTRIN XL) 24 hr tablet 150 mg (150 mg Oral Given 06/27/17 1057)  clonazePAM (KLONOPIN) tablet 1 mg (1 mg Oral Given 06/27/17 1056)  PARoxetine (PAXIL) tablet 40 mg (40 mg Oral Given 06/27/17 1056)  0.9 %  sodium chloride infusion ( Intravenous New Bag/Given 06/27/17 0103)  acetaminophen (TYLENOL) tablet 650 mg (not administered)    Or  acetaminophen (TYLENOL) solution 650 mg (not administered)    Or  acetaminophen (TYLENOL) suppository 650 mg (not administered)  senna-docusate (Senokot-S) tablet 1 tablet (not administered)  enoxaparin (LOVENOX) injection 40 mg (40 mg Subcutaneous Given 06/27/17 0850)  aspirin tablet 325 mg (325 mg Oral Given 06/27/17 1056)   stroke: mapping our early stages of recovery book ( Does not apply Given 06/27/17 0238)    Mobility walks

## 2017-06-27 NOTE — Progress Notes (Signed)
  PT Cancellation Note  Patient Details Name: NATHIAN STENCIL MRN: [id number] DOB: Dec 31, 1970   Cancelled Treatment:    Reason Eval/Treat Not Completed: Patient not medically ready, still having workup for acute stroke. Will check back later today.   Claretha Cooper 06/27/2017, 8:36 AM Tresa Endo PT [phone]

## 2017-06-27 NOTE — ED Notes (Signed)
Patient transported to MRI 

## 2017-06-27 NOTE — ED Notes (Signed)
[facility] called. 

## 2017-06-27 NOTE — Progress Notes (Signed)
OT Cancellation Note  Patient Details Name: Steven Herrera MRN: [id number] DOB: Jul 22, 1970   Cancelled Treatment:    Reason Eval/Treat Not Completed: Patient not medically ready; Patient not medically ready, still having workup for acute stroke. Will check back later today as schedule permits.  Lou Cal, OT Pager [phone] 06/27/2017   Raymondo Band 06/27/2017, 10:07 AM

## 2017-06-27 NOTE — Progress Notes (Signed)
47 yo male admitted with possible stroke,just picked up by [facility].on aspirin and statin.nurse reported that patient has been eating drinking walking talking and using his phone without any issues.work up pending.transfered to [facility] for [facility]y eval.mri Acute/subacute nonhemorrhagic infarct involving the left lentiform nucleus, left internal capsule, and left caudate head. 2. Mild periventricular white matter changes are also noted, slightly advanced for age. 3. No other discrete remote infarcts for white matter disease. 4. Normal variant MRA circle of Willis without significant proximal stenosis, aneurysm, or branch vessel occlusion.

## 2017-06-27 NOTE — Plan of Care (Signed)
  Education: Knowledge of disease or condition will improve 06/27/2017 2007 - Progressing by Bronson Curb, RN Knowledge of secondary prevention will improve 06/27/2017 2007 - Progressing by Bronson Curb, RN Knowledge of patient specific risk factors addressed and post discharge goals established will improve 06/27/2017 2007 - Progressing by Bronson Curb, RN   Coping: Will verbalize positive feelings about self 06/27/2017 2007 - Progressing by Bronson Curb, RN Will identify appropriate support needs 06/27/2017 2007 - Progressing by Bobbye Petti, Lanetta Inch, RN

## 2017-06-27 NOTE — Progress Notes (Signed)
  PROGRESS NOTE  Patient admitted earlier this morning. See H&P. Patient with chief complaint of transient confusion, difficulty with hand-eye coordination. He is being work up for acute/subacute stroke. Spoke with Dr. Rodena Piety regarding transfer from [facility]. Patient examined with family at bedside. Symptoms resolved and feeling back to baseline. Wants to go home. Discussed neuro work up. Stroke team following.    Dessa Phi, DO [facility] [email or web address] Password [facility] 06/27/2017, 3:47 PM

## 2017-06-27 NOTE — H&P (Signed)
History and Physical    Steven Herrera UKG:[id number] DOB: January 02, 1971 DOA: 06/26/2017  PCP: Patient, No Pcp Per   Patient coming from: Home  Chief Complaint: Difficulty with hand-eye coordination   HPI: Steven Herrera is a 47 y.o. male with medical history significant for depression with anxiety, now presenting to the emergency department for evaluation of transient confusion and difficulty with hand eye coordination.  Reports that he had been in his usual state of health when he laid down for a nap at around noon on 06/25/2017, but woke shortly after this with confusion.  He then noted difficulty using his cell phone, attributing this to poor hand eye coordination.  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  Denies headache or change in vision or hearing.  There was no recent fall or trauma.  Reports his father had a stroke in his 44s.  ED Course: Upon arrival to the ED, patient is found to be afebrile, saturating well on room air, with elevated diastolic blood pressure, and vitals otherwise normal.  Chemistry panel and CBC are on noncontrast head CT demonstrates low-attenuation changes in the left basal ganglia and subinsular region with suspicion for acute ischemic CVA.  No hemorrhage is identified on the CT.  Patient remained hemodynamically stable, in no apparent respiratory distress, and will be observed on the telemetry unit of [facility] for ongoing evaluation and management of acute/subacute ischemic stroke.  Review of Systems:  All other systems reviewed and apart from HPI, are negative.  Past Medical History:  Diagnosis Date  . High cholesterol     Past Surgical History:  Procedure Laterality Date  . CARPAL TUNNEL RELEASE    . MOUTH SURGERY    . spider bite    . VASECTOMY       reports that he has been smoking cigarettes.  he has never used smokeless tobacco. He reports that  he drinks alcohol. His drug history is not on file.  Allergies  Allergen Reactions  . Ibuprofen Diarrhea  . Penicillins     Has patient had a PCN reaction causing immediate rash, facial/tongue/throat swelling, SOB or lightheadedness with hypotension: Yes Has patient had a PCN reaction causing severe rash involving mucus membranes or skin necrosis: no Has patient had a PCN reaction that required hospitalization: no Has patient had a PCN reaction occurring within the last 10 years: no If all of the above answers are "NO", then may proceed with Cephalosporin use.    Family History  Problem Relation Age of Onset  . Stroke Father        disabling CVA in early-mid 50's     Prior to Admission medications   Medication Sig Start Date End Date Taking? Authorizing Provider  buPROPion (WELLBUTRIN XL) 150 MG 24 hr tablet Take 150 mg by mouth daily. noonish    [provider]  clonazePAM (KLONOPIN) 1 MG tablet Take 1 mg by mouth 2 (two) times daily.    [provider]  PARoxetine (PAXIL) 40 MG tablet Take 40 mg by mouth daily. 04/10/17   [provider]    Physical Exam: Vitals:   06/26/17 1604 06/26/17 2137 06/26/17 2347 06/26/17 2350  BP:  (!) 142/105 (!) 139/96   Pulse:  63 (!) 59   Resp:  14 17   Temp:  98.3 F (36.8 C)  98.3 F (36.8 C)  TempSrc:  Oral  SpO2:  96% 96%   Weight: 97.5 kg (215 lb)     Height: 6\' 1"  (1.854 m)         Constitutional: NAD, calm, comfortable Eyes: PERTLA, lids and conjunctivae normal ENMT: Mucous membranes are moist. Posterior pharynx clear of any exudate or lesions.   Neck: normal, supple, no masses, no thyromegaly Respiratory: clear to auscultation bilaterally, no wheezing, no crackles. Normal respiratory effort. No accessory muscle use.  Cardiovascular: S1 & S2 heard, regular rate and rhythm. No extremity edema. No significant JVD. Abdomen: No distension, no tenderness, no masses palpated. Bowel sounds normal.    Musculoskeletal: no clubbing / cyanosis. No joint deformity upper and lower extremities.   Skin: no significant rashes, lesions, ulcers. Warm, dry, well-perfused. Neurologic: CN 2-12 grossly intact. Sensation intact. Strength 5/5 in all 4 limbs.  Psychiatric: Alert and oriented x 3. Calm, cooperative.     Labs on Admission: I have personally reviewed following labs and imaging studies  CBC: Recent Labs  Lab 06/26/17 1631  WBC 7.8  HGB 17.9*  HCT 50.7  MCV 93.4  PLT 417   Basic Metabolic Panel: Recent Labs  Lab 06/26/17 1631  NA 134*  K 4.3  CL 105  CO2 22  GLUCOSE 102*  BUN 12  CREATININE 1.05  CALCIUM 8.8*   GFR: Estimated Creatinine Clearance: 109.2 mL/min (by C-G formula based on SCr of 1.05 mg/dL). Liver Function Tests: Recent Labs  Lab 06/26/17 1631  AST 30  ALT 29  ALKPHOS 78  BILITOT 1.1  PROT 7.3  ALBUMIN 3.9   No results for input(s): LIPASE, AMYLASE in the last 168 hours. No results for input(s): AMMONIA in the last 168 hours. Coagulation Profile: No results for input(s): INR, PROTIME in the last 168 hours. Cardiac Enzymes: No results for input(s): CKTOTAL, CKMB, CKMBINDEX, TROPONINI in the last 168 hours. BNP (last 3 results) No results for input(s): PROBNP in the last 8760 hours. HbA1C: No results for input(s): HGBA1C in the last 72 hours. CBG: Recent Labs  Lab 06/26/17 1611  GLUCAP 132*   Lipid Profile: No results for input(s): CHOL, HDL, LDLCALC, TRIG, CHOLHDL, LDLDIRECT in the last 72 hours. Thyroid Function Tests: No results for input(s): TSH, T4TOTAL, FREET4, T3FREE, THYROIDAB in the last 72 hours. Anemia Panel: No results for input(s): VITAMINB12, FOLATE, FERRITIN, TIBC, IRON, RETICCTPCT in the last 72 hours. Urine analysis:    Component Value Date/Time   COLORURINE YELLOW 06/09/2008 1031   APPEARANCEUR CLEAR 06/09/2008 1031   LABSPEC 1.007 06/09/2008 1031   PHURINE 7.0 06/09/2008 1031   GLUCOSEU NEGATIVE 06/09/2008 1031    HGBUR NEGATIVE 06/09/2008 1031   BILIRUBINUR NEGATIVE 06/09/2008 1031   KETONESUR NEGATIVE 06/09/2008 1031   PROTEINUR NEGATIVE 06/09/2008 1031   UROBILINOGEN 0.2 06/09/2008 1031   NITRITE NEGATIVE 06/09/2008 1031   LEUKOCYTESUR  06/09/2008 1031    NEGATIVE MICROSCOPIC NOT DONE ON URINES WITH NEGATIVE PROTEIN, BLOOD, LEUKOCYTES, NITRITE, OR GLUCOSE <1000 mg/dL.   Sepsis Labs: @LABRCNTIP (procalcitonin:4,lacticidven:4) )No results found for this or any previous visit (from the past 240 hour(s)).   Radiological Exams on Admission: Ct Head Wo Contrast  Result Date: 06/26/2017 CLINICAL DATA:  Confusion.  Difficult hand eye coordination.  TIA. EXAM: CT HEAD WITHOUT CONTRAST TECHNIQUE: Contiguous axial images were obtained from the base of the skull through the vertex without intravenous contrast. COMPARISON:  None. FINDINGS: Brain: Vague low-attenuation change demonstrated in the caudate and globus pallidus with extension to the subinsular region on the left. This  suggests early changes of acute infarct. MRI may provide additional characterization if clinically indicated. 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 No mass effect or midline shift. No abnormal extra-axial fluid collections. No ventricular dilatation. No acute intracranial hemorrhage. Vascular: There may be slightly asymmetrical increased density in the left middle cerebral artery. This could indicate vascular thrombosis. Skull: Normal. Negative for fracture or focal lesion. Sinuses/Orbits: No acute finding. Other: None. IMPRESSION: 1. A low-attenuation changes in the left basal ganglia and subinsular region with possible slightly increased asymmetrical density in the left middle cerebral artery. Changes suggest acute ischemia with possible middle cerebral artery vascular thrombosis. MRI may provide additional characterization if clinically indicated. 2. No acute intracranial hemorrhage. 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  Electronically Signed   By: Lucienne Capers M.D.   On: 06/26/2017 22:48    EKG: Independently reviewed. Normal sinus rhythm on monitor   Assessment/Plan  1. Subacute ischemic stroke  - Presents with transient confusion and loss of coordination involving the right hand  - Sxs began 36 hrs prior to arrival, have been improving and nearly resolved  - Head CT reveals low-attenuation changes involving the left basal ganglia and subinsular region consistent with acute/subacute ischemic CVA  - Discussed with neurology who kindly agree to see patient in consultation  - Continue cardiac monitoring, frequent neuro checks, PT/OT/SLP evals - Check MRI brain, MRA head, carotid dopplers, echocardiogram, fasting lipids, and A1c  - Start ASA   2. Depression, anxiety  - Stable, continue Wellbutrin, Paxil, Klonopin     DVT prophylaxis: Lovenox  Code Status: Full  Family Communication: Discussed with patient Disposition Plan: Observe on telemetry Consults called: Neurology Admission status: Observation     Vianne Bulls, MD [facility] Pager [phone]  If 7PM-7AM, please contact night-coverage [email or web address] Password [email or web address]  06/27/2017, 1:10 AM

## 2017-06-28 ENCOUNTER — Observation Stay (HOSPITAL_BASED_OUTPATIENT_CLINIC_OR_DEPARTMENT_OTHER): Payer: 59

## 2017-06-28 DIAGNOSIS — Z8673 Personal history of transient ischemic attack (TIA), and cerebral infarction without residual deficits: Secondary | ICD-10-CM | POA: Diagnosis not present

## 2017-06-28 DIAGNOSIS — F418 Other specified anxiety disorders: Secondary | ICD-10-CM | POA: Diagnosis not present

## 2017-06-28 DIAGNOSIS — I6789 Other cerebrovascular disease: Secondary | ICD-10-CM | POA: Diagnosis not present

## 2017-06-28 DIAGNOSIS — Z79899 Other long term (current) drug therapy: Secondary | ICD-10-CM | POA: Diagnosis not present

## 2017-06-28 DIAGNOSIS — F17210 Nicotine dependence, cigarettes, uncomplicated: Secondary | ICD-10-CM | POA: Diagnosis not present

## 2017-06-28 DIAGNOSIS — I639 Cerebral infarction, unspecified: Secondary | ICD-10-CM

## 2017-06-28 DIAGNOSIS — R030 Elevated blood-pressure reading, without diagnosis of hypertension: Secondary | ICD-10-CM | POA: Diagnosis not present

## 2017-06-28 LAB — CBC
HEMATOCRIT: 51.4 % (ref 39.0–52.0)
Hemoglobin: 17.7 g/dL — ABNORMAL HIGH (ref 13.0–17.0)
MCH: 32.7 pg (ref 26.0–34.0)
MCHC: 34.4 g/dL (ref 30.0–36.0)
MCV: 95 fL (ref 78.0–100.0)
PLATELETS: 186 10*3/uL (ref 150–400)
RBC: 5.41 MIL/uL (ref 4.22–5.81)
RDW: 12.7 % (ref 11.5–15.5)
WBC: 7.5 10*3/uL (ref 4.0–10.5)

## 2017-06-28 LAB — BASIC METABOLIC PANEL
ANION GAP: 8 (ref 5–15)
BUN: 11 mg/dL (ref 6–20)
CALCIUM: 9.3 mg/dL (ref 8.9–10.3)
CO2: 27 mmol/L (ref 22–32)
CREATININE: 1.15 mg/dL (ref 0.61–1.24)
Chloride: 102 mmol/L (ref 101–111)
Glucose, Bld: 93 mg/dL (ref 65–99)
Potassium: 4.8 mmol/L (ref 3.5–5.1)
SODIUM: 137 mmol/L (ref 135–145)

## 2017-06-28 LAB — ECHOCARDIOGRAM COMPLETE
HEIGHTINCHES: 73 in
WEIGHTICAEL: 3440 [oz_av]

## 2017-06-28 LAB — HIV ANTIBODY (ROUTINE TESTING W REFLEX): HIV SCREEN 4TH GENERATION: NONREACTIVE

## 2017-06-28 MED ORDER — ASPIRIN EC 81 MG PO TBEC: 81.0000 mg | DELAYED_RELEASE_TABLET | Freq: Every day | ORAL | 0 refills | Status: AC

## 2017-06-28 MED ORDER — ATORVASTATIN CALCIUM 80 MG PO TABS: 80.0000 mg | ORAL_TABLET | Freq: Every day | ORAL | 0 refills | Status: DC

## 2017-06-28 NOTE — Progress Notes (Signed)
  Echocardiogram 2D Echocardiogram has been performed.  Matilde Bash 06/28/2017, 10:22 AM

## 2017-06-28 NOTE — Progress Notes (Signed)
STROKE TEAM PROGRESS NOTE   HISTORY OF PRESENT ILLNESS (per record) Steven Herrera is an 47 y.o. male with history of hypercholesterolemia, carpal tunnel, depression and anxiety.  Patient presented to the emergency department on 3 January of 2018 for difficulty with hand eye coordination.  She apparently took a nap on 26 June 2007 but upon waking he had some confusion, he noticed some difficulty using the cell phone as far as difficulty pushing the numbers and using it correctly.  However this did make him nervous as his family has a history of strokes that she reported to the emergency department.  Upon entering the patient's blood pressure was stable, chemistry panel and CBC were stable however CT of the head demonstrated a low-attenuation in the left basal ganglia which was suspicious for a possible acute ischemic infarct.  This is followed up by an MRI of the brain which did not show any acute/subacute nonhemorrhagic infarct involving the left lentiform nucleus and internal capsule along with a left caudate head.  MRA was nonrevealing.  Patient's LDL was unable to be calculated secondary to elevated triglycerides.   Date last known well: Date: 06/25/2017 Time last known well: Time: 10:00 tPA Given: No: Out of the window    SUBJECTIVE (INTERVAL HISTORY) His wife is at bedside. He has been non-compliant with medication and is smoking. Discussed needs to stop smoking, manage BP, HLD and other vascular risk factors. Patient and wife understood.    Home Medications:  Current Meds  Medication Sig  . buPROPion (WELLBUTRIN XL) 150 MG 24 hr tablet Take 150 mg by mouth daily. noonish  . clonazePAM (KLONOPIN) 1 MG tablet Take 1 mg by mouth 2 (two) times daily.  Marland Kitchen PARoxetine (PAXIL) 40 MG tablet Take 40 mg by mouth daily.      Hospital Medications:  . aspirin  325 mg Oral Daily  . atorvastatin  80 mg Oral q1800  . buPROPion  150 mg Oral Daily  . clonazePAM  1 mg Oral BID  . enoxaparin  (LOVENOX) injection  40 mg Subcutaneous Q24H  . PARoxetine  40 mg Oral Daily    OBJECTIVE Temp:  [97.6 F (36.4 C)-98.4 F (36.9 C)] 97.6 F (36.4 C) (01/05 0600) Pulse Rate:  [55-68] 55 (01/05 0600) Cardiac Rhythm: Sinus bradycardia (01/05 0702) Resp:  [11-20] 18 (01/05 0600) BP: (108-139)/(83-96) 126/91 (01/05 0600) SpO2:  [97 %-100 %] 98 % (01/05 0600)  CBC:  Recent Labs  Lab 06/26/17 1631 06/28/17 0337  WBC 7.8 7.5  HGB 17.9* 17.7*  HCT 50.7 51.4  MCV 93.4 95.0  PLT 208 737    Basic Metabolic Panel:  Recent Labs  Lab 06/26/17 1631 06/28/17 0337  NA 134* 137  K 4.3 4.8  CL 105 102  CO2 22 27  GLUCOSE 102* 93  BUN 12 11  CREATININE 1.05 1.15  CALCIUM 8.8* 9.3    Lipid Panel:     Component Value Date/Time   CHOL 204 (H) 06/27/2017 0843   TRIG 410 (H) 06/27/2017 0843   HDL 25 (L) 06/27/2017 0843   CHOLHDL 8.2 06/27/2017 0843   VLDL UNABLE TO CALCULATE IF TRIGLYCERIDE OVER 400 mg/dL 06/27/2017 0843   LDLCALC UNABLE TO CALCULATE IF TRIGLYCERIDE OVER 400 mg/dL 06/27/2017 0843   HgbA1c:  Lab Results  Component Value Date   HGBA1C 5.1 06/27/2017   Urine Drug Screen: No results found for: LABOPIA, COCAINSCRNUR, LABBENZ, AMPHETMU, THCU, LABBARB  Alcohol Level     Component Value Date/Time  ETH <10 06/26/2017 2340    IMAGING   Ct Head Wo Contrast 06/26/2017 IMPRESSION:  1. A low-attenuation changes in the left basal ganglia and subinsular region with possible slightly increased asymmetrical density in the left middle cerebral artery. Changes suggest acute ischemia with possible middle cerebral artery vascular thrombosis. MRI may provide additional characterization if clinically indicated.  2. No acute intracranial hemorrhage. 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     Mr Steven Herrera Head Wo Contrast 06/27/2017 IMPRESSION:  1. Acute/subacute nonhemorrhagic infarct involving the left lentiform nucleus, left internal capsule, and left caudate head.  2.  Mild periventricular white matter changes are also noted, slightly advanced for age.  3. No other discrete remote infarcts for white matter disease.  4. Normal variant MRA circle of Willis without significant proximal stenosis, aneurysm, or branch vessel occlusion.    Transthoracic Echocardiogram  06/28/2017 Study Conclusions - Left ventricle: The cavity size was normal. Wall thickness was   normal. Systolic function was normal. The estimated ejection   fraction was in the range of 50% to 55%. Diastolic function is   abnormal, indeterminant grade. Wall motion was normal; there were   no regional wall motion abnormalities. - Aortic valve: Valve area (VTI): 3.84 cm^2. Valve area (Vmax):   3.42 cm^2. Valve area (Vmean): 3.62 cm^2. - Atrial septum: No defect or patent foramen ovale was identified.   Bilateral Carotid Dopplers  06/27/2017 Final Interpretation: Right Carotid: There is no evidence of stenosis in the right ICA. Left Carotid: There is no evidence of stenosis in the left ICA. Vertebrals: Both vertebral arteries were patent with antegrade flow. Subclavians: Normal flow hemodynamics were seen in bilateral subclavian arteries.    PHYSICAL EXAM Vitals:   06/27/17 1746 06/27/17 2105 06/28/17 0100 06/28/17 0600  BP: (!) 120/95 122/83 122/83 (!) 126/91  Pulse: 63 (!) 58 60 (!) 55  Resp: 20 18 18 18   Temp: 98.2 F (36.8 C) 97.6 F (36.4 C) 97.8 F (36.6 C) 97.6 F (36.4 C)  TempSrc: Oral Oral Oral Oral  SpO2: 98% 98% 97% 98%  Weight:      Height:        PHYSICAL EXAM Physical exam: Exam: Gen: NAD Eyes: anicteric sclerae, moist conjunctivae                    CV: no MRG, no carotid bruits, no peripheral edema Mental Status: Alert, follows commands, good historian  Neuro: Detailed Neurologic Exam  Speech:    No aphasia, no dysarthria  Cranial Nerves:    The pupils are equal, round, and reactive to light.. Attempted, Fundi not visualized.  EOMI. No gaze  preference. Visual fields full. Face symmetric, Tongue midline. Hearing intact to voice. Shoulder shrug intact  Motor Observation:    no involuntary movements noted. Tone appears normal.     Strength:    5/5     Sensation:  Intact to LT  Plantars downgoing.    ASSESSMENT/PLAN Mr. Steven Herrera is a 47 y.o. male with history of carpal tunnel syndrome, ongoing tobacco use, hyperlipidemia, and anxiety presenting with difficulty with hand eye coordination and confusion. He did not receive IV t-PA due to late presentation.  Stroke: Multiple infarcts on the left with possible thrombosis left middle cerebral artery. Etiology unknown.In this area may be small vessel but discussed with patient he likely needs further outpatient workup with neurology and to make sure he schedules appointment within next 6 weeks.   Resultant  No deficits  CT head - low-attenuation changes in the left basal ganglia and subinsular region. Possible L MCA vascular thrombosis. Old lacunar infarct left caudate lobe.  MRI head - Acute/subacute nonhemorrhagic infarct involving the left lentiform nucleus, left internal capsule, and left caudate head.   MRA head - Normal variant MRA circle of Willis without stenosis or occlusion.  Carotid Doppler - normal  2D Echo - unremarkable  LDL - unable to calculate - triglycerides greater than 400.  HgbA1c - 5.1  VTE prophylaxis - Lovenox  May need 30-day heart monitor outpatient, discussed with wife and patient they want to leave asap and decline any further workup inpatient Diet Heart Room service appropriate? Yes; Fluid consistency: Thin  No antithrombotic prior to admission, now on aspirin 325 mg daily  Patient counseled to be compliant with his antithrombotic medications  Ongoing aggressive stroke risk factor management  Therapy recommendations:  No follow-up therapies recommended.  Disposition:  Pending  Hypertension  Stable  Permissive hypertension  (OK if < 220/120) but gradually normalize in 5-7 days  Long-term BP goal normotensive  Hyperlipidemia  Home meds: No lipid lowering medications prior to admission.  LDL unable to calculate, goal < 70  Now on Lipitor 80 mg daily.  Continue statin at discharge   Other Stroke Risk Factors  Cigarette smoker - advised to stop smoking  ETOH use, advised to drink no more than 1 drink per day.  Obesity, Body mass index is 28.37 kg/m., recommend weight loss, diet and exercise as appropriate   Hx stroke/TIA - Old lacunar infarct in the right caudate lobe.   Family hx stroke (father)   Other Active Problems  None   Plan / Recommendations  Stroke workup complete  Follow-up with [place] [facility] day # 0  I had a long d/w patient about her recent stroke, risk for recurrent stroke/TIAs, personally independently reviewed imaging studies and stroke evaluation results and answered questions.Continue ASA 382for secondary stroke prevention and maintain strict control of hypertension with blood pressure goal below 130/90, diabetes with hemoglobin A1c goal below 6.5% and lipids with LDL cholesterol goal below 70 mg/dL. I also advised the patient to eat a healthy diet with plenty of whole grains, cereals, fruits and vegetables, exercise regularly and maintain ideal body weight .Followup in the future with me outpatient. Smoking cessation.  To contact Stroke Continuity provider, please refer to [email or web address] After hours, contact General Neurology

## 2017-06-28 NOTE — Progress Notes (Signed)
SLP Cancellation Note  Patient Details Name: Steven Herrera MRN: [id number] DOB: April 20, 1971   Cancelled treatment:       Reason Eval/Treat Not Completed: SLP screened, no needs identified, will sign off   Genie Wenke, Katherene Ponto 06/28/2017, 1:37 PM

## 2017-06-28 NOTE — Discharge Summary (Signed)
Physician Discharge Summary  Steven Herrera:[id number] DOB: 03-Nov-1970 DOA: 06/26/2017  PCP: Patient, No Pcp Per  Admit date: 06/26/2017 Discharge date: 06/28/2017  Admitted From: Home Disposition:  Home   Recommendations for Outpatient Follow-up:  1. Establish with and follow up with PCP in 1 week 2. Follow up with Neurology in 6-8 weeks   Discharge Condition: Stable CODE STATUS: Full  Diet recommendation: Heart healthy   Brief/Interim Summary: HPI: Steven Herrera is a 47 y.o. male with medical history significant for depression with anxiety, now presenting to the emergency department for evaluation of transient confusion and difficulty with hand eye coordination.  Reports that he had been in his usual state of health when he laid down for a nap at around noon on 06/25/2017, but woke shortly after this with confusion.  He then noted difficulty using his cell phone, attributing this to poor hand eye coordination.  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  Denies headache or change in vision or hearing.  There was no recent fall or trauma.  Reports his father had a stroke in his 71s.  ED Course: Upon arrival to the ED, patient is found to be afebrile, saturating well on room air, with elevated diastolic blood pressure, and vitals otherwise normal.  Chemistry panel and CBC are on noncontrast head CT demonstrates low-attenuation changes in the left basal ganglia and subinsular region with suspicion for acute ischemic CVA.  No hemorrhage is identified on the CT.  Patient remained hemodynamically stable, in no apparent respiratory distress, and will be observed on the telemetry unit of [facility] for ongoing evaluation and management of acute/subacute ischemic stroke.  Interim: Patient was evaluated by stroke team.  His symptoms resolved prior to discharge.  Patient was screened by physical therapy,  occupational therapy and speech therapy without any needs identified.  Echocardiogram as well as vascular ultrasound of the carotids were completed with results as below.  Patient is advised to adhere to heart healthy diet, daily aspirin, statin.   Discharge Diagnoses:  Principal Problem:   Acute ischemic stroke ([place]) Active Problems:   Elevated blood-pressure reading without diagnosis of hypertension   Depression with anxiety   Ischemic stroke [facility])   Discharge Instructions  Discharge Instructions    Ambulatory referral to Neurology   Complete by:  As directed    An appointment is requested in approximately: 6-8 weeks with Cecille Rubin NP   Call MD for:  difficulty breathing, headache or visual disturbances   Complete by:  As directed    Call MD for:  extreme fatigue   Complete by:  As directed    Call MD for:  hives   Complete by:  As directed    Call MD for:  persistant dizziness or light-headedness   Complete by:  As directed    Call MD for:  persistant nausea and vomiting   Complete by:  As directed    Call MD for:  severe uncontrolled pain   Complete by:  As directed    Call MD for:  temperature >100.4   Complete by:  As directed    Diet - low sodium heart healthy   Complete by:  As directed    Diet - low sodium heart healthy   Complete by:  As directed    Discharge instructions   Complete by:  As directed    You were cared  for by a hospitalist during your hospital stay. If you have any questions about your discharge medications or the care you received while you were in the hospital after you are discharged, you can call the unit and asked to speak with the hospitalist on call if the hospitalist that took care of you is not available. Once you are discharged, your primary care physician will handle any further medical issues. Please note that NO REFILLS for any discharge medications will be authorized once you are discharged, as it is imperative that you return to your  primary care physician (or establish a relationship with a primary care physician if you do not have one) for your aftercare needs so that they can reassess your need for medications and monitor your lab values.   Increase activity slowly   Complete by:  As directed    Increase activity slowly   Complete by:  As directed      Allergies as of 06/28/2017      Reactions   Ibuprofen Diarrhea   Penicillins    Has patient had a PCN reaction causing immediate rash, facial/tongue/throat swelling, SOB or lightheadedness with hypotension: Yes Has patient had a PCN reaction causing severe rash involving mucus membranes or skin necrosis: no Has patient had a PCN reaction that required hospitalization: no Has patient had a PCN reaction occurring within the last 10 years: no If all of the above answers are "NO", then may proceed with Cephalosporin use.      Medication List    TAKE these medications   aspirin EC 81 MG tablet Take 1 tablet (81 mg total) by mouth daily.   atorvastatin 80 MG tablet Commonly known as:  LIPITOR Take 1 tablet (80 mg total) by mouth daily at 6 PM.   clonazePAM 1 MG tablet Commonly known as:  KLONOPIN Take 1 mg by mouth 2 (two) times daily.   PARoxetine 40 MG tablet Commonly known as:  PAXIL Take 40 mg by mouth daily.   WELLBUTRIN XL 150 MG 24 hr tablet Generic drug:  buPROPion Take 150 mg by mouth daily. noonish      Follow-up Information    [facility]. Schedule an appointment as soon as possible for a visit.   Contact information: [place] [address] [phone]       Garvin Fila, MD. Schedule an appointment as soon as possible for a visit in 8 week(s).   Specialties:  Neurology, Radiology Why:  Office should call you for hospital followup appointment to see Nurse Practitioner Cecille Rubin. Contact information: [address] [place] [address] [phone]           Allergies  Allergen Reactions  . Ibuprofen Diarrhea  . Penicillins     Has patient had a PCN reaction causing immediate rash, facial/tongue/throat swelling, SOB or lightheadedness with hypotension: Yes Has patient had a PCN reaction causing severe rash involving mucus membranes or skin necrosis: no Has patient had a PCN reaction that required hospitalization: no Has patient had a PCN reaction occurring within the last 10 years: no If all of the above answers are "NO", then may proceed with Cephalosporin use.    Consultations:  Neurology    Procedures/Studies: Ct Head Wo Contrast  Result Date: 06/26/2017 CLINICAL DATA:  Confusion.  Difficult hand eye coordination.  TIA. EXAM: CT HEAD WITHOUT CONTRAST TECHNIQUE: Contiguous axial images were obtained from the base of the skull through the  vertex without intravenous contrast. COMPARISON:  None. FINDINGS: Brain: Vague low-attenuation change demonstrated in the caudate and globus pallidus with extension to the subinsular region on the left. This suggests early changes of acute infarct. MRI may provide additional characterization if clinically indicated. 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 No mass effect or midline shift. No abnormal extra-axial fluid collections. No ventricular dilatation. No acute intracranial hemorrhage. Vascular: There may be slightly asymmetrical increased density in the left middle cerebral artery. This could indicate vascular thrombosis. Skull: Normal. Negative for fracture or focal lesion. Sinuses/Orbits: No acute finding. Other: None. IMPRESSION: 1. A low-attenuation changes in the left basal ganglia and subinsular region with possible slightly increased asymmetrical density in the left middle cerebral artery. Changes suggest acute ischemia with possible middle cerebral artery vascular thrombosis. MRI may provide additional characterization if clinically indicated. 2. No acute intracranial hemorrhage. Old  lacunar infarct in the right caudate lobe. Electronically Signed   By: Lucienne Capers M.D.   On: 06/26/2017 22:48   Mr Brain Wo Contrast  Result Date: 06/27/2017 CLINICAL DATA:  Neuro deficits, subacute. New onset confusion and abnormal coordination. EXAM: MRI HEAD WITHOUT CONTRAST MRA HEAD WITHOUT CONTRAST TECHNIQUE: Multiplanar, multiecho pulse sequences of the brain and surrounding structures were obtained without intravenous contrast. Angiographic images of the head were obtained using MRA technique without contrast. COMPARISON:  CT head without contrast. FINDINGS: MRI HEAD FINDINGS Brain: The diffusion-weighted images confirm an acute/ subacute infarct involving the left lentiform nucleus and caudate head. The infarct crosses the left internal capsule. No acute hemorrhage or mass lesion is present. Mild periventricular white matter changes are present on the right, slightly advanced for age. No other infarcts are present. The internal auditory canals are within normal limits bilaterally. The brainstem and cerebellum are normal. Vascular: Flow is present in the major intracranial arteries. Skull and upper cervical spine: The skullbase is within normal limits. The craniocervical junction is normal. Marrow signal is within normal limits. Sinuses/Orbits: The paranasal sinuses and mastoid air cells are clear. Globes and orbits are within normal limits. MRA HEAD FINDINGS The internal carotid arteries are within normal limits from the high cervical segments through the ICA termini bilaterally. The right A1 segment is aplastic. Both ACA territories fill from the left. The left A1 segment is normal. M1 segments are normal bilaterally. The MCA bifurcations are intact. ACA and MCA branch vessels are normal. The left vertebral artery is slightly dominant to the right. The left PICA origin is visualized and normal. The right AICA is dominant. The basilar artery is normal. The left posterior cerebral artery originates  from the basilar tip. The right posterior cerebral artery is of fetal type with a small right P1 segment. PCA branch vessels are normal bilaterally. IMPRESSION: 1. Acute/subacute nonhemorrhagic infarct involving the left lentiform nucleus, left internal capsule, and left caudate head. 2. Mild periventricular white matter changes are also noted, slightly advanced for age. 3. No other discrete remote infarcts for white matter disease. 4. Normal variant MRA circle of Willis without significant proximal stenosis, aneurysm, or branch vessel occlusion. Electronically Signed   By: San Morelle M.D.   On: 06/27/2017 07:59   Mr Jodene Nam Head Wo Contrast  Result Date: 06/27/2017 CLINICAL DATA:  Neuro deficits, subacute. New onset confusion and abnormal coordination. EXAM: MRI HEAD WITHOUT CONTRAST MRA HEAD WITHOUT CONTRAST TECHNIQUE: Multiplanar, multiecho pulse sequences of the brain and surrounding structures were obtained without intravenous contrast. Angiographic images of the head were obtained using  MRA technique without contrast. COMPARISON:  CT head without contrast. FINDINGS: MRI HEAD FINDINGS Brain: The diffusion-weighted images confirm an acute/ subacute infarct involving the left lentiform nucleus and caudate head. The infarct crosses the left internal capsule. No acute hemorrhage or mass lesion is present. Mild periventricular white matter changes are present on the right, slightly advanced for age. No other infarcts are present. The internal auditory canals are within normal limits bilaterally. The brainstem and cerebellum are normal. Vascular: Flow is present in the major intracranial arteries. Skull and upper cervical spine: The skullbase is within normal limits. The craniocervical junction is normal. Marrow signal is within normal limits. Sinuses/Orbits: The paranasal sinuses and mastoid air cells are clear. Globes and orbits are within normal limits. MRA HEAD FINDINGS The internal carotid arteries are  within normal limits from the high cervical segments through the ICA termini bilaterally. The right A1 segment is aplastic. Both ACA territories fill from the left. The left A1 segment is normal. M1 segments are normal bilaterally. The MCA bifurcations are intact. ACA and MCA branch vessels are normal. The left vertebral artery is slightly dominant to the right. The left PICA origin is visualized and normal. The right AICA is dominant. The basilar artery is normal. The left posterior cerebral artery originates from the basilar tip. The right posterior cerebral artery is of fetal type with a small right P1 segment. PCA branch vessels are normal bilaterally. IMPRESSION: 1. Acute/subacute nonhemorrhagic infarct involving the left lentiform nucleus, left internal capsule, and left caudate head. 2. Mild periventricular white matter changes are also noted, slightly advanced for age. 3. No other discrete remote infarcts for white matter disease. 4. Normal variant MRA circle of Willis without significant proximal stenosis, aneurysm, or branch vessel occlusion. Electronically Signed   By: San Morelle M.D.   On: 06/27/2017 07:59    Carotid US  Final Interpretation: Right Carotid: There is no evidence of stenosis in the right ICA. Left Carotid: There is no evidence of stenosis in the left ICA. Vertebrals: Both vertebral arteries were patent with antegrade flow. Subclavians: Normal flow hemodynamics were seen in bilateral subclavian arteries.     Echo Study Conclusions  - Left ventricle: The cavity size was normal. Wall thickness was   normal. Systolic function was normal. The estimated ejection   fraction was in the range of 50% to 55%. Diastolic function is   abnormal, indeterminant grade. Wall motion was normal; there were   no regional wall motion abnormalities. - Aortic valve: Valve area (VTI): 3.84 cm^2. Valve area (Vmax):   3.42 cm^2. Valve area (Vmean): 3.62 cm^2. - Atrial septum: No  defect or patent foramen ovale was identified.  Discharge Exam: Vitals:   06/28/17 1022 06/28/17 1327  BP: 129/88 137/88  Pulse: 61 (!) 58  Resp: 18 18  Temp: 98.2 F (36.8 C) 97.7 F (36.5 C)  SpO2: 97% 95%   Vitals:   06/28/17 0100 06/28/17 0600 06/28/17 1022 06/28/17 1327  BP: 122/83 (!) 126/91 129/88 137/88  Pulse: 60 (!) 55 61 (!) 58  Resp: 18 18 18 18   Temp: 97.8 F (36.6 C) 97.6 F (36.4 C) 98.2 F (36.8 C) 97.7 F (36.5 C)  TempSrc: Oral Oral Oral Oral  SpO2: 97% 98% 97% 95%  Weight:      Height:        General: Pt is alert, awake, not in acute distress Cardiovascular: RRR, S1/S2 +, no rubs, no gallops Respiratory: CTA bilaterally, no wheezing, no rhonchi Abdominal:  Soft, NT, ND, bowel sounds + Extremities: no edema, no cyanosis Neuro: Alert, speech clear, nonfocal exam      The results of significant diagnostics from this hospitalization (including imaging, microbiology, ancillary and laboratory) are listed below for reference.     Microbiology: No results found for this or any previous visit (from the past 240 hour(s)).   Labs: BNP (last 3 results) No results for input(s): BNP in the last 8760 hours. Basic Metabolic Panel: Recent Labs  Lab 06/26/17 1631 06/28/17 0337  NA 134* 137  K 4.3 4.8  CL 105 102  CO2 22 27  GLUCOSE 102* 93  BUN 12 11  CREATININE 1.05 1.15  CALCIUM 8.8* 9.3   Liver Function Tests: Recent Labs  Lab 06/26/17 1631  AST 30  ALT 29  ALKPHOS 78  BILITOT 1.1  PROT 7.3  ALBUMIN 3.9   No results for input(s): LIPASE, AMYLASE in the last 168 hours. No results for input(s): AMMONIA in the last 168 hours. CBC: Recent Labs  Lab 06/26/17 1631 06/28/17 0337  WBC 7.8 7.5  HGB 17.9* 17.7*  HCT 50.7 51.4  MCV 93.4 95.0  PLT 208 186   Cardiac Enzymes: No results for input(s): CKTOTAL, CKMB, CKMBINDEX, TROPONINI in the last 168 hours. BNP: Invalid input(s): POCBNP CBG: Recent Labs  Lab 06/26/17 1611  GLUCAP 132*    D-Dimer No results for input(s): DDIMER in the last 72 hours. Hgb A1c Recent Labs    06/27/17 0843  HGBA1C 5.1   Lipid Profile Recent Labs    06/27/17 0843  CHOL 204*  HDL 25*  LDLCALC UNABLE TO CALCULATE IF TRIGLYCERIDE OVER 400 mg/dL  TRIG 410*  CHOLHDL 8.2   Thyroid function studies No results for input(s): TSH, T4TOTAL, T3FREE, THYROIDAB in the last 72 hours.  Invalid input(s): FREET3 Anemia work up No results for input(s): VITAMINB12, FOLATE, FERRITIN, TIBC, IRON, RETICCTPCT in the last 72 hours. Urinalysis    Component Value Date/Time   COLORURINE YELLOW 06/09/2008 1031   APPEARANCEUR CLEAR 06/09/2008 1031   LABSPEC 1.007 06/09/2008 1031   PHURINE 7.0 06/09/2008 1031   GLUCOSEU NEGATIVE 06/09/2008 1031   HGBUR NEGATIVE 06/09/2008 1031   BILIRUBINUR NEGATIVE 06/09/2008 1031   KETONESUR NEGATIVE 06/09/2008 1031   PROTEINUR NEGATIVE 06/09/2008 1031   UROBILINOGEN 0.2 06/09/2008 1031   NITRITE NEGATIVE 06/09/2008 1031   LEUKOCYTESUR  06/09/2008 1031    NEGATIVE MICROSCOPIC NOT DONE ON URINES WITH NEGATIVE PROTEIN, BLOOD, LEUKOCYTES, NITRITE, OR GLUCOSE <1000 mg/dL.   Sepsis Labs Invalid input(s): PROCALCITONIN,  WBC,  LACTICIDVEN Microbiology No results found for this or any previous visit (from the past 240 hour(s)).   Time coordinating discharge: 40 minutes  SIGNED:  Dessa Phi, DO [facility] Pager [phone]  If 7PM-7AM, please contact night-coverage [email or web address] Password [email or web address] 06/28/2017, 2:52 PM

## 2017-06-28 NOTE — Evaluation (Signed)
Physical Therapy Evaluation Patient Details Name: Steven Herrera MRN: [id number] DOB: Jan 08, 1971 Today's Date: 06/28/2017   History of Present Illness  47 y.o. male with history of hypercholesterolemia, carpal tunnel, depression and anxiety.  Patient presented to the emergency department on 3 January of 2018 for difficulty with hand eye coordination.  MRI revealed acute/subacute nonhemorrhagic infarct involving the left lentiform nucleus, left internal capsule, and left caudate head.     Clinical Impression  PT eval complete. Pt is independent with all functional mobility. Strength is 5/5 bilat. Pt reports he is back to baseline with all symptoms resolved. See below for further details. PT signing off.    Follow Up Recommendations No PT follow up    Equipment Recommendations  None recommended by PT    Recommendations for Other Services       Precautions / Restrictions Precautions Precautions: None      Mobility  Bed Mobility Overal bed mobility: Independent                Transfers Overall transfer level: Independent Equipment used: None                Ambulation/Gait Ambulation/Gait assistance: Independent Ambulation Distance (Feet): 500 Feet Assistive device: None Gait Pattern/deviations: WFL(Within Functional Limits)   Gait velocity interpretation: >2.62 ft/sec, indicative of independent community ambulator General Gait Details: steady gait  Stairs Stairs: Yes Stairs assistance: Independent Stair Management: No rails;Forwards;Alternating pattern Number of Stairs: 5    Wheelchair Mobility    Modified Rankin (Stroke Patients Only) Modified Rankin (Stroke Patients Only) Pre-Morbid Rankin Score: No symptoms Modified Rankin: No symptoms     Balance Overall balance assessment: Independent                               Standardized Balance Assessment Standardized Balance Assessment : Dynamic Gait Index   Dynamic Gait  Index Level Surface: Normal Change in Gait Speed: Normal Gait with Horizontal Head Turns: Normal Gait with Vertical Head Turns: Normal Gait and Pivot Turn: Normal Step Over Obstacle: Normal Step Around Obstacles: Normal Steps: Normal Total Score: 24       Pertinent Vitals/Pain Pain Assessment: No/denies pain    Home Living Family/patient expects to be discharged to:: Private residence Living Arrangements: Spouse/significant other Available Help at Discharge: Family;Available 24 hours/day Type of Home: House Home Access: Stairs to enter   [employer] of Steps: 4 Home Layout: One level Home Equipment: Grab bars - tub/shower      Prior Function Level of Independence: Independent               Hand Dominance   Dominant Hand: Right    Extremity/Trunk Assessment   Upper Extremity Assessment Upper Extremity Assessment: Overall WFL for tasks assessed    Lower Extremity Assessment Lower Extremity Assessment: Overall WFL for tasks assessed    Cervical / Trunk Assessment Cervical / Trunk Assessment: Normal  Communication   Communication: No difficulties  Cognition Arousal/Alertness: Awake/alert Behavior During Therapy: WFL for tasks assessed/performed Overall Cognitive Status: Within Functional Limits for tasks assessed                                        General Comments      Exercises     Assessment/Plan    PT Assessment Patent does not need any further PT  services  PT Problem List         PT Treatment Interventions      PT Goals (Current goals can be found in the Care Plan section)  Acute Rehab PT Goals Patient Stated Goal: home PT Goal Formulation: All assessment and education complete, DC therapy    Frequency     Barriers to discharge        Co-evaluation               AM-PAC PT "6 Clicks" Daily Activity  Outcome Measure Difficulty turning over in bed (including adjusting bedclothes, sheets and  blankets)?: None Difficulty moving from lying on back to sitting on the side of the bed? : None Difficulty sitting down on and standing up from a chair with arms (e.g., wheelchair, bedside commode, etc,.)?: None Help needed moving to and from a bed to chair (including a wheelchair)?: None Help needed walking in hospital room?: None Help needed climbing 3-5 steps with a railing? : None 6 Click Score: 24    End of Session   Activity Tolerance: Patient tolerated treatment well Patient left: in bed;with call bell/phone within reach Nurse Communication: Mobility status PT Visit Diagnosis: Unsteadiness on feet (R26.81)    Time: 0998-3382 PT Time Calculation (min) (ACUTE ONLY): 13 min   Charges:   PT Evaluation $PT Eval Low Complexity: 1 Low     PT G Codes:   PT G-Codes **NOT FOR INPATIENT CLASS** Functional Assessment Tool Used: AM-PAC 6 Clicks Basic Mobility Functional Limitation: Mobility: Walking and moving around Mobility: Walking and Moving Around Current Status (N0539): 0 percent impaired, limited or restricted Mobility: Walking and Moving Around Goal Status (J6734): 0 percent impaired, limited or restricted Mobility: Walking and Moving Around Discharge Status (L9379): 0 percent impaired, limited or restricted    Lorrin Goodell, PT  Office # [phone] Pager [phone]   Lorriane Shire 06/28/2017, 9:16 AM

## 2017-06-28 NOTE — Progress Notes (Signed)
OT Cancellation Note  Patient Details Name: Steven Herrera MRN: [id number] DOB: 04-04-1971   Cancelled Treatment:    Reason Eval/Treat Not Completed: OT screened, no needs identified, will sign off. Per chart review and conversation with PT, pt is at baseline with ADLs and feels his symptoms have resolved.  Tyrone Schimke OTR/L Pager: [phone]  06/28/2017, 10:12 AM

## 2017-06-30 MED FILL — ATORVASTATIN 80 MG TABLET: 80 | 30 days supply | Qty: 30 | Fill #0

## 2017-06-30 MED FILL — ASPIRIN ADULT LOW STRENGTH: 81 | 30 days supply | Qty: 30 | Fill #0

## 2017-07-08 NOTE — Progress Notes (Signed)
Subjective:    Patient ID: Steven Herrera, male    DOB: 1970/11/14, 47 y.o.   MRN: [id number]  HPI:  Steven Herrera is here to establish as a new pt.  He is a pleasant 47 year old male.  JQD:UKRCVKF/MMCRFVOHKG, HL and he suffered Acute Ischemic Stroke 06/26/17, hospitalized until 06/28/17- he was instructed to establish with PCP in 1 week and f/u with Neuro- he has appt 08/18/17 with Dr. Leonie Herrera During hospitalization: noncontrast head CT demonstrates low-attenuation changes in the left basal ganglia and subinsular region with suspicion for acute ischemic CVA.  Carotid US: Both vertebral arteries were patent with antegrade flow. Subclavians: Normal flow hemodynamics were seen in bilateral subclavian arteries.  Echo: EF 50-55%,  He was previously using [facility] for primary healthcare needs, has not had CPE in years. He drinks 40-50 oz water/day, diet "not the greatest", and does not exercise regularly.  He has been reducing tobacco use- 1-2 "black n milds"/day. 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 Negative for activity change, appetite change, chills, diaphoresis, fever and unexpected weight change.  Eyes: Negative for visual disturbance.  Respiratory: Negative for cough, chest tightness, shortness of breath, wheezing and stridor.   Cardiovascular: Negative for chest pain, palpitations and leg swelling.  Gastrointestinal: Negative for abdominal distention, abdominal pain, anal bleeding, constipation, diarrhea, nausea and vomiting.  Endocrine: Negative for cold intolerance, heat intolerance, polydipsia, polyphagia and polyuria.  Neurological: Positive for headaches. Negative for dizziness.  Hematological: Does not bruise/bleed easily.  Psychiatric/Behavioral: Positive for dysphoric mood and sleep  disturbance. Negative for confusion, self-injury and suicidal ideas. The patient is nervous/anxious. The patient is not hyperactive.        Objective:   Physical Exam  Constitutional: He is oriented to person, place, and time. He appears well-developed and well-nourished. No distress.  HENT:  Head: Normocephalic and atraumatic.  Right Ear: External ear normal.  Left Ear: External ear normal.  Cardiovascular: Normal rate, regular rhythm, normal heart sounds and intact distal pulses.  No murmur heard. Pulmonary/Chest: Effort normal and breath sounds normal. No respiratory distress. He has no wheezes. He has no rales. He exhibits no tenderness.  Neurological: He is alert and oriented to person, place, and time.  Skin: Skin is warm and dry. No rash noted. He is not diaphoretic. No erythema. No pallor.  Psychiatric: He has a normal mood and affect. His behavior is normal. Judgment and thought content normal.  Nursing note and vitals reviewed.         Assessment & Plan:   1. Depression with anxiety   2. Acute ischemic stroke (Steven Herrera)   3. Healthcare maintenance     Healthcare maintenance Continue all medications as directed. Keep drinking plenty of water and follow heart healthy diet. Continue to reduce tobacco use-GREAT JOB! Reduce alcohol consumption- nor more than 2 drinks/day. Please keep appt with Neurologist. Please follow-up in 6 weeks for complete physical.  Depression with anxiety [place] Controlled Substance Database reviewed- regular RFs on Clonazepam 1mg  BID- time appropriate He has been taking Paroxetine 40mg  daily, Bupropion 150mg  daily, and Clonazepam 1mg  BID for >4 years with good sx control and denies SE. Medications continued today, declined CBT referral.  Acute ischemic stroke [facility]) Reviewed all notes/labs/imaging from recent hospitalization. He denies any current neuro sx's  and has f/u with Dr. Leonie Herrera 08/18/17 Recommended to stop ETOH/tobacco  use.    FOLLOW-UP:  Return in about 6 weeks (around 08/22/2017) for CPE.

## 2017-07-11 ENCOUNTER — Encounter: Payer: Self-pay | Admitting: Adult Health

## 2017-07-11 ENCOUNTER — Ambulatory Visit (INDEPENDENT_AMBULATORY_CARE_PROVIDER_SITE_OTHER): Payer: 59 | Admitting: Adult Health

## 2017-07-11 VITALS — BP 101/69 | HR 68 | Ht 72.5 in | Wt 207.1 lb

## 2017-07-11 DIAGNOSIS — I639 Cerebral infarction, unspecified: Secondary | ICD-10-CM

## 2017-07-11 DIAGNOSIS — F418 Other specified anxiety disorders: Secondary | ICD-10-CM | POA: Diagnosis not present

## 2017-07-11 DIAGNOSIS — Z0000 Encounter for general adult medical examination without abnormal findings: Secondary | ICD-10-CM | POA: Insufficient documentation

## 2017-07-11 MED ORDER — CLONAZEPAM 1 MG PO TABS: 1.0000 mg | ORAL_TABLET | Freq: Two times a day (BID) | ORAL | 0 refills | Status: DC

## 2017-07-11 MED ORDER — ATORVASTATIN CALCIUM 80 MG PO TABS: 80.0000 mg | ORAL_TABLET | Freq: Every day | ORAL | 0 refills | Status: DC

## 2017-07-11 MED ORDER — PAROXETINE HCL 40 MG PO TABS: 40.0000 mg | ORAL_TABLET | Freq: Every day | ORAL | 0 refills | Status: DC

## 2017-07-11 MED ORDER — BUPROPION HCL ER (XL) 150 MG PO TB24
150.0000 mg | ORAL_TABLET | Freq: Every day | ORAL | 0 refills | Status: DC
Start: 2017-07-11 — End: 2017-08-25

## 2017-07-11 MED FILL — buPROPion HCL ER (XL) 150 M: 150 | 90 days supply | Qty: 90 | Fill #0

## 2017-07-11 MED FILL — PARoxetine HCL 40 MG TABS: 40 | 90 days supply | Qty: 90 | Fill #0

## 2017-07-11 MED FILL — clonazePAM 1 MG TABS: 1 | 30 days supply | Qty: 60 | Fill #0

## 2017-07-11 NOTE — Assessment & Plan Note (Signed)
Continue all medications as directed. Keep drinking plenty of water and follow heart healthy diet. Continue to reduce tobacco use-GREAT JOB! Reduce alcohol consumption- nor more than 2 drinks/day. Please keep appt with Neurologist. Please follow-up in 6 weeks for complete physical.

## 2017-07-11 NOTE — Assessment & Plan Note (Signed)
Reviewed all notes/labs/imaging from recent hospitalization. He denies any current neuro sx's and has f/u with Dr. [name] 08/18/17 Recommended to stop ETOH/tobacco use.

## 2017-07-11 NOTE — Assessment & Plan Note (Signed)
[place] Controlled Substance Database reviewed- regular RFs on Clonazepam 1mg  BID- time appropriate He has been taking Paroxetine 40mg  daily, Bupropion 150mg  daily, and Clonazepam 1mg  BID for >4 years with good sx control and denies SE. Medications continued today, declined CBT referral.

## 2017-07-11 NOTE — Patient Instructions (Signed)
Heart-Healthy Eating Plan Many factors influence your heart health, including eating and exercise habits. Heart (coronary) risk increases with abnormal blood fat (lipid) levels. Heart-healthy meal planning includes limiting unhealthy fats, increasing healthy fats, and making other small dietary changes. This includes maintaining a healthy body weight to help keep lipid levels within a normal range. What is my plan? Your health care provider recommends that you:  Get no more than __25___% of the total calories in your daily diet from fat.  Limit your intake of saturated fat to less than __5___% of your total calories each day.  Limit the amount of cholesterol in your diet to less than __300__ mg per day.  What types of fat should I choose?  Choose healthy fats more often. Choose monounsaturated and polyunsaturated fats, such as olive oil and canola oil, flaxseeds, walnuts, almonds, and seeds.  Eat more omega-3 fats. Good choices include salmon, mackerel, sardines, tuna, flaxseed oil, and ground flaxseeds. Aim to eat fish at least two times each week.  Limit saturated fats. Saturated fats are primarily found in animal products, such as meats, butter, and cream. Plant sources of saturated fats include palm oil, palm kernel oil, and coconut oil.  Avoid foods with partially hydrogenated oils in them. These contain trans fats. Examples of foods that contain trans fats are stick margarine, some tub margarines, cookies, crackers, and other baked goods. What general guidelines do I need to follow?  Check food labels carefully to identify foods with trans fats or high amounts of saturated fat.  Fill one half of your plate with vegetables and green salads. Eat 4-5 servings of vegetables per day. A serving of vegetables equals 1 cup of raw leafy vegetables,  cup of raw or cooked cut-up vegetables, or  cup of vegetable juice.  Fill one fourth of your plate with whole grains. Look for the word "whole"  as the first word in the ingredient list.  Fill one fourth of your plate with lean protein foods.  Eat 4-5 servings of fruit per day. A serving of fruit equals one medium whole fruit,  cup of dried fruit,  cup of fresh, frozen, or canned fruit, or  cup of 100% fruit juice.  Eat more foods that contain soluble fiber. Examples of foods that contain this type of fiber are apples, broccoli, carrots, beans, peas, and barley. Aim to get 20-30 g of fiber per day.  Eat more home-cooked food and less restaurant, buffet, and fast food.  Limit or avoid alcohol.  Limit foods that are high in starch and sugar.  Avoid fried foods.  Cook foods by using methods other than frying. Baking, boiling, grilling, and broiling are all great options. Other fat-reducing suggestions include: ? Removing the skin from poultry. ? Removing all visible fats from meats. ? Skimming the fat off of stews, soups, and gravies before serving them. ? Steaming vegetables in water or broth.  Lose weight if you are overweight. Losing just 5-10% of your initial body weight can help your overall health and prevent diseases such as diabetes and heart disease.  Increase your consumption of nuts, legumes, and seeds to 4-5 servings per week. One serving of dried beans or legumes equals  cup after being cooked, one serving of nuts equals 1 ounces, and one serving of seeds equals  ounce or 1 tablespoon.  You may need to monitor your salt (sodium) intake, especially if you have high blood pressure. Talk with your health care provider or dietitian to get  more information about reducing sodium. What foods can I eat? Grains  Breads, including [place], white, pita, wheat, raisin, rye, oatmeal, and [place]. Tortillas that are neither fried nor made with lard or trans fat. Low-fat rolls, including hotdog and hamburger buns and English muffins. Biscuits. Muffins. Waffles. Pancakes. Light popcorn. Whole-grain cereals. Flatbread. Melba  toast. Pretzels. Breadsticks. Rusks. Low-fat snacks and crackers, including oyster, saltine, matzo, graham, animal, and rye. Rice and pasta, including brown rice and those that are made with whole wheat. Vegetables All vegetables. Fruits All fruits, but limit coconut. Meats and Other Protein Sources Lean, well-trimmed beef, veal, pork, and lamb. Chicken and [place] without skin. All fish and shellfish. Wild duck, rabbit, pheasant, and venison. Egg whites or low-cholesterol egg substitutes. Dried beans, peas, lentils, and tofu.Seeds and most nuts. Dairy Low-fat or nonfat cheeses, including ricotta, string, and mozzarella. Skim or 1% milk that is liquid, powdered, or evaporated. Buttermilk that is made with low-fat milk. Nonfat or low-fat yogurt. Beverages Mineral water. Diet carbonated beverages. Sweets and Desserts Sherbets and fruit ices. Honey, jam, marmalade, jelly, and syrups. Meringues and gelatins. Pure sugar candy, such as hard candy, jelly beans, gumdrops, mints, marshmallows, and small amounts of dark chocolate. [employer] Eat all sweets and desserts in moderation. Fats and Oils Nonhydrogenated (trans-free) margarines. Vegetable oils, including soybean, sesame, sunflower, olive, peanut, safflower, corn, canola, and cottonseed. Salad dressings or mayonnaise that are made with a vegetable oil. Limit added fats and oils that you use for cooking, baking, salads, and as spreads. Other Cocoa powder. Coffee and tea. All seasonings and condiments. The items listed above may not be a complete list of recommended foods or beverages. Contact your dietitian for more options. What foods are not recommended? Grains Breads that are made with saturated or trans fats, oils, or whole milk. Croissants. Butter rolls. Cheese breads. Sweet rolls. Donuts. Buttered popcorn. Chow mein noodles. High-fat crackers, such as cheese or butter crackers. Meats and Other Protein Sources Fatty meats, such as  hotdogs, short ribs, sausage, spareribs, bacon, ribeye roast or steak, and mutton. High-fat deli meats, such as salami and bologna. Caviar. Domestic duck and goose. Organ meats, such as kidney, liver, sweetbreads, brains, gizzard, chitterlings, and heart. Dairy Cream, sour cream, cream cheese, and creamed cottage cheese. Whole milk cheeses, including blue (bleu), Monterey Jack, [place], [place], American, [place], Swiss, [place], [place], and [place]. Whole or 2% milk that is liquid, evaporated, or condensed. Whole buttermilk. Cream sauce or high-fat cheese sauce. Yogurt that is made from whole milk. Beverages Regular sodas and drinks with added sugar. Sweets and Desserts Frosting. Pudding. Cookies. Cakes other than angel food cake. Candy that has milk chocolate or white chocolate, hydrogenated fat, butter, coconut, or unknown ingredients. Buttered syrups. Full-fat ice cream or ice cream drinks. Fats and Oils Gravy that has suet, meat fat, or shortening. Cocoa butter, hydrogenated oils, palm oil, coconut oil, palm kernel oil. These can often be found in baked products, candy, fried foods, nondairy creamers, and whipped toppings. Solid fats and shortenings, including bacon fat, salt pork, lard, and butter. Nondairy cream substitutes, such as coffee creamers and sour cream substitutes. Salad dressings that are made of unknown oils, cheese, or sour cream. The items listed above may not be a complete list of foods and beverages to avoid. Contact your dietitian for more information. This information is not intended to replace advice given to you by your health care provider. Make sure you discuss any questions you have with your health care  provider. Document Released: 03/19/2008 Document Revised: 12/29/2015 Document Reviewed: 12/02/2013 Elsevier Interactive Patient Education  2018 [employer].   Stroke Prevention Some health problems and behaviors may make it more likely for you to have a stroke. Below  are ways to lessen your risk of having a stroke.  Be active for at least 30 minutes on most or all days.  Do not smoke. Try not to be around others who smoke.  Do not drink too much alcohol. ? Do not have more than 2 drinks a day if you are a man. ? Do not have more than 1 drink a day if you are a woman and are not pregnant.  Eat healthy foods, such as fruits and vegetables. If you were put on a specific diet, follow the diet as told.  Keep your cholesterol levels under control through diet and medicines. Look for foods that are low in saturated fat, trans fat, cholesterol, and are high in fiber.  If you have diabetes, follow all diet plans and take your medicine as told.  Ask your doctor if you need treatment to lower your blood pressure. If you have high blood pressure (hypertension), follow all diet plans and take your medicine as told by your doctor.  If you are 25-6 years old, have your blood pressure checked every 3-5 years. If you are age 62 or older, have your blood pressure checked every year.  Keep a healthy weight. Eat foods that are low in calories, salt, saturated fat, trans fat, and cholesterol.  Do not take drugs.  Avoid birth control pills, if this applies. Talk to your doctor about the risks of taking birth control pills.  Talk to your doctor if you have sleep problems (sleep apnea).  Take all medicine as told by your doctor. ? You may be told to take aspirin or blood thinner medicine. Take this medicine as told by your doctor. ? Understand your medicine instructions.  Make sure any other conditions you have are being taken care of.  Get help right away if:  You suddenly lose feeling (you feel numb) or have weakness in your face, arm, or leg.  Your face or eyelid hangs down to one side.  You suddenly feel confused.  You have trouble talking (aphasia) or understanding what people are saying.  You suddenly have trouble seeing in one or both eyes.  You  suddenly have trouble walking.  You are dizzy.  You lose your balance or your movements are clumsy (uncoordinated).  You suddenly have a very bad headache and you do not know the cause.  You have new chest pain.  Your heart feels like it is fluttering or skipping a beat (irregular heartbeat). Do not wait to see if the symptoms above go away. Get help right away. Call your local emergency services (911 in [place] Do not drive yourself to the hospital. This information is not intended to replace advice given to you by your health care provider. Make sure you discuss any questions you have with your health care provider. Document Released: 12/10/2011 Document Revised: 11/16/2015 Document Reviewed: 12/11/2012 Elsevier Interactive Patient Education  2018 [place] all medications as directed. Keep drinking plenty of water and follow heart healthy diet. Continue to reduce tobacco use-GREAT JOB! Reduce alcohol consumption- nor more than 2 drinks/day. Please keep appt with Neurologist. Please follow-up in 6 weeks for complete physical. WELCOME TO THE PRACTICE!

## 2017-08-13 MED FILL — ATORVASTATIN 80 MG TABLET: 80 | 90 days supply | Qty: 90 | Fill #0

## 2017-08-18 ENCOUNTER — Other Ambulatory Visit: Payer: Self-pay | Admitting: Adult Health

## 2017-08-18 ENCOUNTER — Encounter: Payer: Self-pay | Admitting: Neurology

## 2017-08-18 ENCOUNTER — Ambulatory Visit (INDEPENDENT_AMBULATORY_CARE_PROVIDER_SITE_OTHER): Payer: 59 | Admitting: Neurology

## 2017-08-18 VITALS — BP 109/71 | HR 75 | Wt 206.0 lb

## 2017-08-18 DIAGNOSIS — I639 Cerebral infarction, unspecified: Secondary | ICD-10-CM | POA: Diagnosis not present

## 2017-08-18 DIAGNOSIS — E785 Hyperlipidemia, unspecified: Secondary | ICD-10-CM | POA: Diagnosis not present

## 2017-08-18 MED FILL — clonazePAM 1 MG TABS: 1 | 30 days supply | Qty: 60 | Fill #0

## 2017-08-18 NOTE — Telephone Encounter (Signed)
Appropriate to refill. Thanks! Steven Herrera

## 2017-08-18 NOTE — Progress Notes (Signed)
[facility] [address] [place]. [place] [address] [phone]       OFFICE FOLLOW UP NOTE  Mr. PISTOL KESSENICH Date of Birth:  05/25/1971 Medical Record Number:  [id number]   Reason for Referral: Hospital stroke follow-up  CHIEF COMPLAINT:  Chief Complaint  Patient presents with  . Follow-up    Hospital Stroke follow up, Pt was seen by Dr.Aoor and Dr. Jaynee Eagles in hospital. PT was not seen by Dr. Erlinda Hong and Dr [name]    HPI: Anchor Dwan is being seen today in the office for cryptogenic stroke on 06/26/2017. History obtained from patient and wife and chart review. Reviewed all radiology images and labs personally. [name] an 47 y.o.malewith history of hypercholesterolemia, carpal tunnel,depression and anxiety. Patient presented to the emergency department on 06/26/2017 for difficulty with hand eye coordination.  He apparently took a nap on 06/25/2017 but upon waking he had some confusion, he noticed some difficulty using the cell phone as far as difficulty pushing the numbers and using it correctly. However this did make him nervous as his family has a history of strokes that he reported to the emergency department. Upon entering the patient's blood pressure was stable, chemistry panel and CBC were stable however CT of the head demonstrated a low-attenuation in the left basal ganglia which was suspicious for a possible acute ischemic infarct. This is followed up by an MRI of the brain which did show acute/subacute nonhemorrhagic infarct involving the leftlentiform nucleus and internal capsule along with a left caudate head. MRA was nonrevealing.  2D echo showed EF 50-55%. Carotid Dopplers show no evidence of stenosis.  Patient's LDL was unable to be calculated secondary to elevated triglycerides.  Patient was started on Lipitor 80 mg.  Patient was also started on aspirin 81 mg.  Patient discharged home in stable condition without therapies needed. Since  discharge, patient has been doing well overall.  Patient is accompanied today by his wife.  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  Patient did quit tobacco use on 07/25/2017.  He continues to take Lipitor 80 mg without side effects such as myalgias.  Patient's blood pressure is under good control at today's appointment 109/71.  Patient denies new or worsening TIA/stroke symptoms.  ROS:   14 system review of systems performed and negative with exception of restless leg, joint pain, memory loss, agitation, depression, nervous/anxious and hyperactive  PMH:  Past Medical History:  Diagnosis Date  . High cholesterol   . Stroke [facility])     PSH:  Past Surgical History:  Procedure Laterality Date  . CARPAL TUNNEL RELEASE    . MOUTH SURGERY    . spider bite    . VASECTOMY      Social History:  Social History   Socioeconomic History  . Marital status: Married    Spouse name: Not on file  . Number of children: Not on file  . Years of education: Not on file  . Highest education level: Not on file  Social Needs  . Financial resource strain: Not on file  . Food insecurity - worry: Not on file  . Food insecurity - inability: Not on file  . Transportation needs - medical: Not on file  . Transportation needs - non-medical: Not on file  Occupational History  . Not on file  Tobacco Use  . Smoking status: Former Smoker    Packs/day: 0.25  Years: 28.00    Pack years: 7.00    Types: Cigarettes    Last attempt to quit: 07/25/2017    Years since quitting: 0.0  . Smokeless tobacco: Never Used  Substance and Sexual Activity  . Alcohol use: Yes    Alcohol/week: 0.6 oz    Types: 1 Shots of liquor per week    Comment: 0.5 pints nightly, daily  . Drug use: Yes    Types: Marijuana    Comment: takes daily for pain leisure   . Sexual activity: Yes    Birth control/protection: Surgical  Other Topics Concern  . Not on file    Social History Narrative  . Not on file    Family History:  Family History  Problem Relation Age of Onset  . Stroke Father        disabling CVA in early-mid 50's  . Hypertension Father   . Alcohol abuse Father   . Heart disease Mother   . Dementia Mother   . Atrial fibrillation Mother   . Drug abuse Sister   . Hypertension Brother   . Healthy Sister     Medications:   Current Outpatient Medications on File Prior to Visit  Medication Sig Dispense Refill  . atorvastatin (LIPITOR) 80 MG tablet Take 1 tablet (80 mg total) by mouth daily at 6 PM. 90 tablet 0  . buPROPion (WELLBUTRIN XL) 150 MG 24 hr tablet Take 1 tablet (150 mg total) by mouth daily. noonish 90 tablet 0  . PARoxetine (PAXIL) 40 MG tablet Take 1 tablet (40 mg total) by mouth daily. 90 tablet 0   No current facility-administered medications on file prior to visit.     Allergies:   Allergies  Allergen Reactions  . Ibuprofen Diarrhea  . Penicillins     Has patient had a PCN reaction causing immediate rash, facial/tongue/throat swelling, SOB or lightheadedness with hypotension: Yes Has patient had a PCN reaction causing severe rash involving mucus membranes or skin necrosis: no Has patient had a PCN reaction that required hospitalization: no Has patient had a PCN reaction occurring within the last 10 years: no If all of the above answers are "NO", then may proceed with Cephalosporin use.    Physical Exam  Vitals:   08/18/17 1615  BP: 109/71  Pulse: 75  Weight: 206 lb (93.4 kg)   Body mass index is 27.55 kg/m. No exam data present  General: well developed, middle-aged Caucasian male, well nourished, seated, in no evident distress Head: head normocephalic and atraumatic.   Neck: supple with no carotid or supraclavicular bruits Cardiovascular: regular rate and rhythm, no murmurs Musculoskeletal: no deformity Skin:  no rash/petichiae Vascular:  Normal pulses all extremities  Neurologic Exam Mental  Status: Awake and fully alert. Oriented to place and time. Recent and remote memory intact. Attention span, concentration and fund of knowledge appropriate. Mood and affect appropriate.  Cranial Nerves: Fundoscopic exam reveals sharp disc margins. Pupils equal, briskly reactive to light. Extraocular movements full without nystagmus. Visual fields full to confrontation. Hearing intact. Facial sensation intact. Face, tongue, palate moves normally and symmetrically.  Motor: Normal bulk and tone. Normal strength in all tested extremity muscles. Sensory.: intact to touch , pinprick , position and vibratory sensation.  Coordination: Rapid alternating movements normal in all extremities. Finger-to-nose and heel-to-shin performed accurately bilaterally. Gait and Station: Arises from chair without difficulty. Stance is normal. Gait demonstrates normal stride length and balance . Able to heel, toe and tandem walk without difficulty.  Reflexes: 2+ and symmetric. Toes downgoing.    NIHSS  0 Modified Rankin  0   Diagnostic Data (Labs, Imaging, Testing)  Ct Head Wo Contrast 06/26/2017 IMPRESSION:  1. A low-attenuation changes in the left basal ganglia and subinsular region with possible slightly increased asymmetrical density in the left middle cerebral artery. Changes suggest acute ischemia with possible middle cerebral artery vascular thrombosis. MRI may provide additional characterization if clinically indicated.  2. No acute intracranial hemorrhage. 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    Mr Jodene Nam Head Wo Contrast 06/27/2017 IMPRESSION:  1. Acute/subacute nonhemorrhagic infarct involving the left lentiform nucleus, left internal capsule, and left caudate head.  2. Mild periventricular white matter changes are also noted, slightly advanced for age.  3. No other discrete remote infarcts for white matter disease.  4. Normal variant MRA circle of Willis without significant proximal stenosis,  aneurysm, or branch vessel occlusion.    Transthoracic Echocardiogram  06/28/2017 Study Conclusions - Left ventricle: The cavity size was normal. Wall thickness was normal. Systolic function was normal. The estimated ejection fraction was in the range of 50% to 55%. Diastolic function is abnormal, indeterminant grade. Wall motion was normal; there were no regional wall motion abnormalities. - Aortic valve: Valve area (VTI): 3.84 cm^2. Valve area (Vmax): 3.42 cm^2. Valve area (Vmean): 3.62 cm^2. - Atrial septum: No defect or patent foramen ovale was identified.   Bilateral Carotid Dopplers  06/27/2017 Final Interpretation: Right Carotid: There is no evidence of stenosis in the right ICA. Left Carotid: There is no evidence of stenosis in the left ICA. Vertebrals: Both vertebral arteries were patent with antegrade flow. Subclavians: Normal flow hemodynamics were seen in bilateral subclavian arteries.    ASSESSMENT: Shem Plemmons 47 y.o. year old male here with cryptogenic stroke on 06/26/17.  Vascular risk factors are hyperlipidemia and tobacco use.  All deficits have resolved and patient is here with no complaints.    PLAN:I had a long d/w patient about his recent stroke, risk for recurrent stroke/TIAs, personally independently reviewed imaging studies and stroke evaluation results and answered questions.Continue aspirin 81 mg daily  for secondary stroke prevention and maintain strict control of hypertension with blood pressure goal below 130/90, diabetes with hemoglobin A1c goal below 6.5% and lipids with LDL cholesterol goal below 70 mg/dL. I also advised the patient to eat a healthy diet with plenty of whole grains, cereals, fruits and vegetables, exercise regularly and maintain ideal body weight Followup in the future with Janett Billow, NP in 6 months  -TEE study and  loop recorder placement due to cryptogenic stroke  -smoking cessation  -continue lipitor 80mg  and have  PCP follow up on cholesterol levels - Consider participation in [place] stroke prevention study if interested     Orders Placed This Encounter  Procedures  . Ambulatory referral to Cardiac Electrophysiology   No orders of the defined types were placed in this encounter.  Return in about 6 months (around 02/15/2018).  Greater than 50% of time during this 25  minute visit was spent on counseling,explanation of diagnosis, planning of further management, discussion with patient and family and coordination of care.   Antony Contras, MD  [facility] [address] [place], [place] [address]  Phone [phone] Fax [phone]

## 2017-08-18 NOTE — Telephone Encounter (Signed)
Please advise of appropriateness of refill.  Pt has CPE scheduled 08/25/17.  Database checked.  NO controlled substances since your last RX for this medication in 1/19.  Charyl Bigger, CMA

## 2017-08-18 NOTE — Patient Instructions (Addendum)
I had a long d/w patient about his recent stroke, risk for recurrent stroke/TIAs, personally independently reviewed imaging studies and stroke evaluation results and answered questions.Continue aspirin 81 mg daily  for secondary stroke prevention and maintain strict control of hypertension with blood pressure goal below 130/90, diabetes with hemoglobin A1c goal below 6.5% and lipids with LDL cholesterol goal below 70 mg/dL. I also advised the patient to eat a healthy diet with plenty of whole grains, cereals, fruits and vegetables, exercise regularly and maintain ideal body weight Followup in the future with Janett Billow, NP in 6 months  -continue smoking cessation - good job with this!!  -continue lipitor 80mg  and have PCP follow up on cholesterol levels  -follow up for TEE study and possible loop recorder placement - referal was sent to Dr. [name]. His office will contact you to set up an appointment  -follow up in 6 months or call sooner if needed

## 2017-08-25 ENCOUNTER — Encounter: Payer: Self-pay | Admitting: Adult Health

## 2017-08-25 ENCOUNTER — Ambulatory Visit (INDEPENDENT_AMBULATORY_CARE_PROVIDER_SITE_OTHER): Payer: 59 | Admitting: Adult Health

## 2017-08-25 ENCOUNTER — Institutional Professional Consult (permissible substitution): Payer: 59 | Admitting: Internal Medicine

## 2017-08-25 ENCOUNTER — Ambulatory Visit: Payer: 59 | Admitting: Cardiology

## 2017-08-25 VITALS — BP 115/75 | HR 61 | Ht 72.5 in | Wt 206.0 lb

## 2017-08-25 DIAGNOSIS — Z0000 Encounter for general adult medical examination without abnormal findings: Secondary | ICD-10-CM | POA: Diagnosis not present

## 2017-08-25 DIAGNOSIS — R030 Elevated blood-pressure reading, without diagnosis of hypertension: Secondary | ICD-10-CM | POA: Diagnosis not present

## 2017-08-25 DIAGNOSIS — E785 Hyperlipidemia, unspecified: Secondary | ICD-10-CM

## 2017-08-25 DIAGNOSIS — F418 Other specified anxiety disorders: Secondary | ICD-10-CM | POA: Diagnosis not present

## 2017-08-25 DIAGNOSIS — Z79899 Other long term (current) drug therapy: Secondary | ICD-10-CM | POA: Insufficient documentation

## 2017-08-25 MED ORDER — PAROXETINE HCL 40 MG PO TABS: 40.0000 mg | ORAL_TABLET | Freq: Every day | ORAL | 0 refills | Status: DC

## 2017-08-25 MED ORDER — ATORVASTATIN CALCIUM 80 MG PO TABS: 80.0000 mg | ORAL_TABLET | Freq: Every day | ORAL | 0 refills | Status: DC

## 2017-08-25 MED ORDER — BUPROPION HCL ER (XL) 150 MG PO TB24: ORAL_TABLET | ORAL | 0 refills | Status: DC

## 2017-08-25 NOTE — Progress Notes (Signed)
Subjective:    Patient ID: EDDER BELLANCA, male    DOB: 02-Sep-1970, 47 y.o.   MRN: [id number]  HPI: 07/11/17 OV:  Mr. Zurawski is here to establish as a new pt.  He is a pleasant 47 year old male.  ERX:[id number], HL and he suffered Acute Ischemic Stroke 06/26/17, hospitalized until 06/28/17- he was instructed to establish with PCP in 1 week and f/u with Neuro- he has appt 08/18/17 with Dr. [name] During hospitalization: noncontrast head CT demonstrates low-attenuation changes in the left basal ganglia and subinsular region with suspicion for acute ischemic CVA.  Carotid US: Both vertebral arteries were patent with antegrade flow. Subclavians: Normal flow hemodynamics were seen in bilateral subclavian arteries.  Echo: EF 50-55%,  He was previously using [facility] for primary healthcare needs, has not had CPE in years. He drinks 40-50 oz water/day, diet "not the greatest", and does not exercise regularly.  He has been reducing tobacco use- 1-2 "black n milds"/day. He drinks 1/2 pint of liquor/day He uses marijuana daily, estimates 1/2 oz per month He denies substance use has caused issues with work (he owns a tattoo parlor)  08/25/17 OV: Mr. Sprung presents for CPE. He is compliant on all medications and denies SE. His last tobacco use was 07/25/17- GREAT! He was seen by Neurology 08/18/17- advised to keep BP <130/90, A1c <6.5%, LDL<70 He was referred to cards for TEE and lop recorder bu Neuro, however he was confused about referral. He has dramatically reduced ETOH use, completley stopped tobacco use (since 07/25/17), reduced saturated fat/CHO/intake, however continues to use marijuana nightly. Overall he feels "pretty good" and denies acute complaints today.  Healthcare Maintenance: Declined DRE today  Patient Care Team    Relationship Specialty Notifications Start End  Frost, Valetta Fuller D, NP PCP - General Family Medicine  07/11/17   Garvin Fila, MD Consulting Physician  Neurology  07/11/17   [facility]    08/25/17     Patient Active Problem List   Diagnosis Date Noted  . On statin therapy 08/25/2017  . Healthcare maintenance 07/11/2017  . Depression with anxiety 06/27/2017  . Ischemic stroke ([place]) 06/27/2017  . Acute ischemic stroke ([place]) 06/26/2017  . Elevated blood-pressure reading without diagnosis of hypertension 06/26/2017  . NEVUS 02/13/2007     Past Medical History:  Diagnosis Date  . High cholesterol   . Stroke [facility])      Past Surgical History:  Procedure Laterality Date  . CARPAL TUNNEL RELEASE    . MOUTH SURGERY    . spider bite    . VASECTOMY       Family History  Problem Relation Age of Onset  . Stroke Father        disabling CVA in early-mid 50's  . Hypertension Father   . Alcohol abuse Father   . Heart disease Mother   . Dementia Mother   . Atrial fibrillation Mother   . Drug abuse Sister   . Hypertension Brother   . Healthy Sister      Social History   Substance and Sexual Activity  Drug Use Yes  . Types: Marijuana   Comment: takes daily for pain leisure      Social History   Substance and Sexual Activity  Alcohol Use Yes  . Alcohol/week: 0.6 oz  . Types: 1 Shots of liquor per week   Comment: 0.5 pints nightly, daily     Social History   Tobacco Use  Smoking Status Former Smoker  . Packs/day: 0.25  . Years: 28.00  . Pack years: 7.00  . Types: Cigarettes  . Last attempt to quit: 07/25/2017  . Years since quitting: 0.0  Smokeless Tobacco Never Used     Outpatient Encounter Medications as of 08/25/2017  Medication Sig  . atorvastatin (LIPITOR) 80 MG tablet Take 1 tablet (80 mg total) by mouth daily at 6 PM.  . buPROPion (WELLBUTRIN XL) 150 MG 24 hr tablet One tablet by mouth daily  . clonazePAM (KLONOPIN) 1 MG tablet TAKE 1 TABLET BY MOUTH TWICE A DAY  . PARoxetine (PAXIL) 40 MG tablet Take 1 tablet (40 mg total) by mouth daily.  . [DISCONTINUED] atorvastatin (LIPITOR) 80  MG tablet Take 1 tablet (80 mg total) by mouth daily at 6 PM.  . [DISCONTINUED] buPROPion (WELLBUTRIN XL) 150 MG 24 hr tablet Take 1 tablet (150 mg total) by mouth daily. noonish  . [DISCONTINUED] PARoxetine (PAXIL) 40 MG tablet Take 1 tablet (40 mg total) by mouth daily.   No facility-administered encounter medications on file as of 08/25/2017.     Allergies: Ibuprofen and Penicillins  Body mass index is 27.55 kg/m.  Blood pressure 115/75, pulse 61, height 6' 0.5" (1.842 m), weight 206 lb (93.4 kg), SpO2 98 %.  Review of Systems  Constitutional: Positive for fatigue. Negative for activity change, appetite change, chills, diaphoresis, fever and unexpected weight change.  Eyes: Negative for visual disturbance.  Respiratory: Negative for cough, chest tightness, shortness of breath, wheezing and stridor.   Cardiovascular: Negative for chest pain, palpitations and leg swelling.  Gastrointestinal: Negative for abdominal distention, abdominal pain, anal bleeding, constipation, diarrhea, nausea and vomiting.  Endocrine: Negative for cold intolerance, heat intolerance, polydipsia, polyphagia and polyuria.  Neurological: Positive for headaches. Negative for dizziness.  Hematological: Does not bruise/bleed easily.  Psychiatric/Behavioral: Positive for dysphoric mood and sleep disturbance. Negative for confusion, self-injury and suicidal ideas. The patient is nervous/anxious. The patient is not hyperactive.        Objective:   Physical Exam  Constitutional: He is oriented to person, place, and time. He appears well-developed and well-nourished. No distress.  HENT:  Head: Normocephalic and atraumatic.  Right Ear: External ear normal.  Left Ear: External ear normal.  Cardiovascular: Normal rate, regular rhythm, normal heart sounds and intact distal pulses.  No murmur heard. Pulmonary/Chest: Effort normal and breath sounds normal. No respiratory distress. He has no wheezes. He has no rales. He  exhibits no tenderness.  Neurological: He is alert and oriented to person, place, and time.  Skin: Skin is warm and dry. No rash noted. He is not diaphoretic. No erythema. No pallor.  Psychiatric: He has a normal mood and affect. His behavior is normal. Judgment and thought content normal.  Nursing note and vitals reviewed.         Assessment & Plan:   1. Healthcare maintenance   2. On statin therapy   3. Hyperlipidemia LDL goal <70   4. Depression with anxiety   5. Elevated blood-pressure reading without diagnosis of hypertension     Healthcare maintenance Continue all medications as directed. Continue to abstain from tobacco and reduce alcohol use. Increase regular walking. We will call when lab results are available. Follow-up with Neurology as directed. Follow-up 5 months, sooner if needed.  On statin therapy Started on atorvastatin 80mg  nightly LDL goal <70 Will re-check lipids in 2 months Continue to reduce saturated fat intake and increase regular walking.  Elevated blood-pressure reading  without diagnosis of hypertension BP at gaol 115/75, HR 61 Per Neuro goal is <130/90   Depression with anxiety Stable Denies thoughts of harming himself/others Currently on Paroxetine 40mg  QD, Wellbutrin 150mg  QD, and Clonazepam 1mg  BID We discussed eventually weaning him off clonazepam    FOLLOW-UP:  Return in about 5 months (around 01/25/2018) for Regular Follow Up, HTN.

## 2017-08-25 NOTE — Assessment & Plan Note (Signed)
Started on atorvastatin 80mg  nightly LDL goal <70 Will re-check lipids in 2 months Continue to reduce saturated fat intake and increase regular walking.

## 2017-08-25 NOTE — Assessment & Plan Note (Signed)
BP at gaol 115/75, HR 61 Per Neuro goal is <130/90

## 2017-08-25 NOTE — Assessment & Plan Note (Signed)
Continue all medications as directed. Continue to abstain from tobacco and reduce alcohol use. Increase regular walking. We will call when lab results are available. Follow-up with Neurology as directed. Follow-up 5 months, sooner if needed.

## 2017-08-25 NOTE — Patient Instructions (Addendum)
Heart-Healthy Eating Plan Many factors influence your heart health, including eating and exercise habits. Heart (coronary) risk increases with abnormal blood fat (lipid) levels. Heart-healthy meal planning includes limiting unhealthy fats, increasing healthy fats, and making other small dietary changes. This includes maintaining a healthy body weight to help keep lipid levels within a normal range. What is my plan? Your health care provider recommends that you:  Get no more than _25___% of the total calories in your daily diet from fat.  Limit your intake of saturated fat to less than __5___% of your total calories each day.  Limit the amount of cholesterol in your diet to less than _300___ mg per day.  What types of fat should I choose?  Choose healthy fats more often. Choose monounsaturated and polyunsaturated fats, such as olive oil and canola oil, flaxseeds, walnuts, almonds, and seeds.  Eat more omega-3 fats. Good choices include salmon, mackerel, sardines, tuna, flaxseed oil, and ground flaxseeds. Aim to eat fish at least two times each week.  Limit saturated fats. Saturated fats are primarily found in animal products, such as meats, butter, and cream. Plant sources of saturated fats include palm oil, palm kernel oil, and coconut oil.  Avoid foods with partially hydrogenated oils in them. These contain trans fats. Examples of foods that contain trans fats are stick margarine, some tub margarines, cookies, crackers, and other baked goods. What general guidelines do I need to follow?  Check food labels carefully to identify foods with trans fats or high amounts of saturated fat.  Fill one half of your plate with vegetables and green salads. Eat 4-5 servings of vegetables per day. A serving of vegetables equals 1 cup of raw leafy vegetables,  cup of raw or cooked cut-up vegetables, or  cup of vegetable juice.  Fill one fourth of your plate with whole grains. Look for the word "whole"  as the first word in the ingredient list.  Fill one fourth of your plate with lean protein foods.  Eat 4-5 servings of fruit per day. A serving of fruit equals one medium whole fruit,  cup of dried fruit,  cup of fresh, frozen, or canned fruit, or  cup of 100% fruit juice.  Eat more foods that contain soluble fiber. Examples of foods that contain this type of fiber are apples, broccoli, carrots, beans, peas, and barley. Aim to get 20-30 g of fiber per day.  Eat more home-cooked food and less restaurant, buffet, and fast food.  Limit or avoid alcohol.  Limit foods that are high in starch and sugar.  Avoid fried foods.  Cook foods by using methods other than frying. Baking, boiling, grilling, and broiling are all great options. Other fat-reducing suggestions include: ? Removing the skin from poultry. ? Removing all visible fats from meats. ? Skimming the fat off of stews, soups, and gravies before serving them. ? Steaming vegetables in water or broth.  Lose weight if you are overweight. Losing just 5-10% of your initial body weight can help your overall health and prevent diseases such as diabetes and heart disease.  Increase your consumption of nuts, legumes, and seeds to 4-5 servings per week. One serving of dried beans or legumes equals  cup after being cooked, one serving of nuts equals 1 ounces, and one serving of seeds equals  ounce or 1 tablespoon.  You may need to monitor your salt (sodium) intake, especially if you have high blood pressure. Talk with your health care provider or dietitian to get  more information about reducing sodium. What foods can I eat? Grains  Breads, including [place], white, pita, wheat, raisin, rye, oatmeal, and [place]. Tortillas that are neither fried nor made with lard or trans fat. Low-fat rolls, including hotdog and hamburger buns and English muffins. Biscuits. Muffins. Waffles. Pancakes. Light popcorn. Whole-grain cereals. Flatbread. Melba  toast. Pretzels. Breadsticks. Rusks. Low-fat snacks and crackers, including oyster, saltine, matzo, graham, animal, and rye. Rice and pasta, including brown rice and those that are made with whole wheat. Vegetables All vegetables. Fruits All fruits, but limit coconut. Meats and Other Protein Sources Lean, well-trimmed beef, veal, pork, and lamb. Chicken and [place] without skin. All fish and shellfish. Wild duck, rabbit, pheasant, and venison. Egg whites or low-cholesterol egg substitutes. Dried beans, peas, lentils, and tofu.Seeds and most nuts. Dairy Low-fat or nonfat cheeses, including ricotta, string, and mozzarella. Skim or 1% milk that is liquid, powdered, or evaporated. Buttermilk that is made with low-fat milk. Nonfat or low-fat yogurt. Beverages Mineral water. Diet carbonated beverages. Sweets and Desserts Sherbets and fruit ices. Honey, jam, marmalade, jelly, and syrups. Meringues and gelatins. Pure sugar candy, such as hard candy, jelly beans, gumdrops, mints, marshmallows, and small amounts of dark chocolate. [employer] Eat all sweets and desserts in moderation. Fats and Oils Nonhydrogenated (trans-free) margarines. Vegetable oils, including soybean, sesame, sunflower, olive, peanut, safflower, corn, canola, and cottonseed. Salad dressings or mayonnaise that are made with a vegetable oil. Limit added fats and oils that you use for cooking, baking, salads, and as spreads. Other Cocoa powder. Coffee and tea. All seasonings and condiments. The items listed above may not be a complete list of recommended foods or beverages. Contact your dietitian for more options. What foods are not recommended? Grains Breads that are made with saturated or trans fats, oils, or whole milk. Croissants. Butter rolls. Cheese breads. Sweet rolls. Donuts. Buttered popcorn. Chow mein noodles. High-fat crackers, such as cheese or butter crackers. Meats and Other Protein Sources Fatty meats, such as  hotdogs, short ribs, sausage, spareribs, bacon, ribeye roast or steak, and mutton. High-fat deli meats, such as salami and bologna. Caviar. Domestic duck and goose. Organ meats, such as kidney, liver, sweetbreads, brains, gizzard, chitterlings, and heart. Dairy Cream, sour cream, cream cheese, and creamed cottage cheese. Whole milk cheeses, including blue (bleu), Monte[place] Jack, [place], [place], [place], [place], [place], [place], [place], and [place]. Whole or 2% milk that is liquid, evaporated, or condensed. Whole buttermilk. Cream sauce or high-fat cheese sauce. Yogurt that is made from whole milk. Beverages Regular sodas and drinks with added sugar. Sweets and Desserts Frosting. Pudding. Cookies. Cakes other than angel food cake. Candy that has milk chocolate or white chocolate, hydrogenated fat, butter, coconut, or unknown ingredients. Buttered syrups. Full-fat ice cream or ice cream drinks. Fats and Oils Gravy that has suet, meat fat, or shortening. Cocoa butter, hydrogenated oils, palm oil, coconut oil, palm kernel oil. These can often be found in baked products, candy, fried foods, nondairy creamers, and whipped toppings. Solid fats and shortenings, including bacon fat, salt pork, lard, and butter. Nondairy cream substitutes, such as coffee creamers and sour cream substitutes. Salad dressings that are made of unknown oils, cheese, or sour cream. The items listed above may not be a complete list of foods and beverages to avoid. Contact your dietitian for more information. This information is not intended to replace advice given to you by your health care provider. Make sure you discuss any questions you have with your health care  provider. Document Released: 03/19/2008 Document Revised: 12/29/2015 Document Reviewed: 12/02/2013 Elsevier Interactive Patient Education  2018 [employer] for Adults, Male A healthy lifestyle and preventive care can promote health and wellness.  Preventive health guidelines for men include the following key practices:  A routine yearly physical is a good way to check with your health care provider about your health and preventative screening. It is a chance to share any concerns and updates on your health and to receive a thorough exam.  Visit your dentist for a routine exam and preventative care every 6 months. Brush your teeth twice a day and floss once a day. Good oral hygiene prevents tooth decay and gum disease.  The frequency of eye exams is based on your age, health, family medical history, use of contact lenses, and other factors. Follow your health care provider's recommendations for frequency of eye exams.  Eat a healthy diet. Foods such as vegetables, fruits, whole grains, low-fat dairy products, and lean protein foods contain the nutrients you need without too many calories. Decrease your intake of foods high in solid fats, added sugars, and salt. Eat the right amount of calories for you.Get information about a proper diet from your health care provider, if necessary.  Regular physical exercise is one of the most important things you can do for your health. Most adults should get at least 150 minutes of moderate-intensity exercise (any activity that increases your heart rate and causes you to sweat) each week. In addition, most adults need muscle-strengthening exercises on 2 or more days a week.  Maintain a healthy weight. The body mass index (BMI) is a screening tool to identify possible weight problems. It provides an estimate of body fat based on height and weight. Your health care provider can find your BMI and can help you achieve or maintain a healthy weight.For adults 20 years and older:  A BMI below 18.5 is considered underweight.  A BMI of 18.5 to 24.9 is normal.  A BMI of 25 to 29.9 is considered overweight.  A BMI of 30 and above is considered obese.  Maintain normal blood lipids and cholesterol levels by  exercising and minimizing your intake of saturated fat. Eat a balanced diet with plenty of fruit and vegetables. Blood tests for lipids and cholesterol should begin at age 62 and be repeated every 5 years. If your lipid or cholesterol levels are high, you are over 50, or you are at high risk for heart disease, you may need your cholesterol levels checked more frequently.Ongoing high lipid and cholesterol levels should be treated with medicines if diet and exercise are not working.  If you smoke, find out from your health care provider how to quit. If you do not use tobacco, do not start.  Lung cancer screening is recommended for adults aged 30-80 years who are at high risk for developing lung cancer because of a history of smoking. A yearly low-dose CT scan of the lungs is recommended for people who have at least a 30-pack-year history of smoking and are a current smoker or have quit within the past 15 years. A pack year of smoking is smoking an average of 1 pack of cigarettes a day for 1 year (for example: 1 pack a day for 30 years or 2 packs a day for 15 years). Yearly screening should continue until the smoker has stopped smoking for at least 15 years. Yearly screening should be stopped for people who develop a  health problem that would prevent them from having lung cancer treatment.  If you choose to drink alcohol, do not have more than 2 drinks per day. One drink is considered to be 12 ounces (355 mL) of beer, 5 ounces (148 mL) of wine, or 1.5 ounces (44 mL) of liquor.  Avoid use of street drugs. Do not share needles with anyone. Ask for help if you need support or instructions about stopping the use of drugs.  High blood pressure causes heart disease and increases the risk of stroke. Your blood pressure should be checked at least every 1-2 years. Ongoing high blood pressure should be treated with medicines, if weight loss and exercise are not effective.  If you are 50-81 years old, ask your health  care provider if you should take aspirin to prevent heart disease.  Diabetes screening is done by taking a blood sample to check your blood glucose level after you have not eaten for a certain period of time (fasting). If you are not overweight and you do not have risk factors for diabetes, you should be screened once every 3 years starting at age 28. If you are overweight or obese and you are 38-89 years of age, you should be screened for diabetes every year as part of your cardiovascular risk assessment.  Colorectal cancer can be detected and often prevented. Most routine colorectal cancer screening begins at the age of 52 and continues through age 60. However, your health care provider may recommend screening at an earlier age if you have risk factors for colon cancer. On a yearly basis, your health care provider may provide home test kits to check for hidden blood in the stool. Use of a small camera at the end of a tube to directly examine the colon (sigmoidoscopy or colonoscopy) can detect the earliest forms of colorectal cancer. Talk to your health care provider about this at age 44, when routine screening begins. Direct exam of the colon should be repeated every 5-10 years through age 74, unless early forms of precancerous polyps or small growths are found.  People who are at an increased risk for hepatitis B should be screened for this virus. You are considered at high risk for hepatitis B if:  You were born in a country where hepatitis B occurs often. Talk with your health care provider about which countries are considered high risk.  Your parents were born in a high-risk country and you have not received a shot to protect against hepatitis B (hepatitis B vaccine).  You have HIV or AIDS.  You use needles to inject street drugs.  You live with, or have sex with, someone who has hepatitis B.  You are a man who has sex with other men (MSM).  You get hemodialysis treatment.  You take  certain medicines for conditions such as cancer, organ transplantation, and autoimmune conditions.  Hepatitis C blood testing is recommended for all people born from [birth year] through 1965 and any individual with known risks for hepatitis C.  Practice safe sex. Use condoms and avoid high-risk sexual practices to reduce the spread of sexually transmitted infections (STIs). STIs include gonorrhea, chlamydia, syphilis, trichomonas, herpes, HPV, and human immunodeficiency virus (HIV). Herpes, HIV, and HPV are viral illnesses that have no cure. They can result in disability, cancer, and death.  If you are a man who has sex with other men, you should be screened at least once per year for:  HIV.  Urethral, rectal, and pharyngeal infection of  gonorrhea, chlamydia, or both.  If you are at risk of being infected with HIV, it is recommended that you take a prescription medicine daily to prevent HIV infection. This is called preexposure prophylaxis (PrEP). You are considered at risk if:  You are a man who has sex with other men (MSM) and have other risk factors.  You are a heterosexual man, are sexually active, and are at increased risk for HIV infection.  You take drugs by injection.  You are sexually active with a partner who has HIV.  Talk with your health care provider about whether you are at high risk of being infected with HIV. If you choose to begin PrEP, you should first be tested for HIV. You should then be tested every 3 months for as long as you are taking PrEP.  A one-time screening for abdominal aortic aneurysm (AAA) and surgical repair of large AAAs by ultrasound are recommended for men ages 34 to 93 years who are current or former smokers.  Healthy men should no longer receive prostate-specific antigen (PSA) blood tests as part of routine cancer screening. Talk with your health care provider about prostate cancer screening.  Testicular cancer screening is not recommended for adult males  who have no symptoms. Screening includes self-exam, a health care provider exam, and other screening tests. Consult with your health care provider about any symptoms you have or any concerns you have about testicular cancer.  Use sunscreen. Apply sunscreen liberally and repeatedly throughout the day. You should seek shade when your shadow is shorter than you. Protect yourself by wearing long sleeves, pants, a wide-brimmed hat, and sunglasses year round, whenever you are outdoors.  Once a month, do a whole-body skin exam, using a mirror to look at the skin on your back. Tell your health care provider about new moles, moles that have irregular borders, moles that are larger than a pencil eraser, or moles that have changed in shape or color.  Stay current with required vaccines (immunizations).  Influenza vaccine. All adults should be immunized every year.  Tetanus, diphtheria, and acellular pertussis (Td, Tdap) vaccine. An adult who has not previously received Tdap or who does not know his vaccine status should receive 1 dose of Tdap. This initial dose should be followed by tetanus and diphtheria toxoids (Td) booster doses every 10 years. Adults with an unknown or incomplete history of completing a 3-dose immunization series with Td-containing vaccines should begin or complete a primary immunization series including a Tdap dose. Adults should receive a Td booster every 10 years.  Varicella vaccine. An adult without evidence of immunity to varicella should receive 2 doses or a second dose if he has previously received 1 dose.  Human papillomavirus (HPV) vaccine. Males aged 11-21 years who have not received the vaccine previously should receive the 3-dose series. Males aged 22-26 years may be immunized. Immunization is recommended through the age of 55 years for any male who has sex with males and did not get any or all doses earlier. Immunization is recommended for any person with an immunocompromised  condition through the age of 81 years if he did not get any or all doses earlier. During the 3-dose series, the second dose should be obtained 4-8 weeks after the first dose. The third dose should be obtained 24 weeks after the first dose and 16 weeks after the second dose.  Zoster vaccine. One dose is recommended for adults aged 76 years or older unless certain conditions are present.  Measles, mumps, and rubella (MMR) vaccine. Adults born before 40 generally are considered immune to measles and mumps. Adults born in [birth year] or later should have 1 or more doses of MMR vaccine unless there is a contraindication to the vaccine or there is laboratory evidence of immunity to each of the three diseases. A routine second dose of MMR vaccine should be obtained at least 28 days after the first dose for students attending postsecondary schools, health care workers, or international travelers. People who received inactivated measles vaccine or an unknown type of measles vaccine during 1963-1967 should receive 2 doses of MMR vaccine. People who received inactivated mumps vaccine or an unknown type of mumps vaccine before 1979 and are at high risk for mumps infection should consider immunization with 2 doses of MMR vaccine. Unvaccinated health care workers born before 5 who lack laboratory evidence of measles, mumps, or rubella immunity or laboratory confirmation of disease should consider measles and mumps immunization with 2 doses of MMR vaccine or rubella immunization with 1 dose of MMR vaccine.  Pneumococcal 13-valent conjugate (PCV13) vaccine. When indicated, a person who is uncertain of his immunization history and has no record of immunization should receive the PCV13 vaccine. All adults 27 years of age and older should receive this vaccine. An adult aged 55 years or older who has certain medical conditions and has not been previously immunized should receive 1 dose of PCV13 vaccine. This PCV13 should be  followed with a dose of pneumococcal polysaccharide (PPSV23) vaccine. Adults who are at high risk for pneumococcal disease should obtain the PPSV23 vaccine at least 8 weeks after the dose of PCV13 vaccine. Adults older than 46 years of age who have normal immune system function should obtain the PPSV23 vaccine dose at least 1 year after the dose of PCV13 vaccine.  Pneumococcal polysaccharide (PPSV23) vaccine. When PCV13 is also indicated, PCV13 should be obtained first. All adults aged 77 years and older should be immunized. An adult younger than age 29 years who has certain medical conditions should be immunized. Any person who resides in a nursing home or long-term care facility should be immunized. An adult smoker should be immunized. People with an immunocompromised condition and certain other conditions should receive both PCV13 and PPSV23 vaccines. People with human immunodeficiency virus (HIV) infection should be immunized as soon as possible after diagnosis. Immunization during chemotherapy or radiation therapy should be avoided. Routine use of PPSV23 vaccine is not recommended for American Indians, [place]atives, or people younger than 65 years unless there are medical conditions that require PPSV23 vaccine. When indicated, people who have unknown immunization and have no record of immunization should receive PPSV23 vaccine. One-time revaccination 5 years after the first dose of PPSV23 is recommended for people aged 19-64 years who have chronic kidney failure, nephrotic syndrome, asplenia, or immunocompromised conditions. People who received 1-2 doses of PPSV23 before age 82 years should receive another dose of PPSV23 vaccine at age 21 years or later if at least 5 years have passed since the previous dose. Doses of PPSV23 are not needed for people immunized with PPSV23 at or after age 40 years.  Meningococcal vaccine. Adults with asplenia or persistent complement component deficiencies should receive 2  doses of quadrivalent meningococcal conjugate (MenACWY-D) vaccine. The doses should be obtained at least 2 months apart. Microbiologists working with certain meningococcal bacteria, [employer] recruits, people at risk during an outbreak, and people who travel to or live in countries with a high rate of meningitis  should be immunized. A first-year college student up through age 70 years who is living in a residence hall should receive a dose if he did not receive a dose on or after his 16th birthday. Adults who have certain high-risk conditions should receive one or more doses of vaccine.  Hepatitis A vaccine. Adults who wish to be protected from this disease, have chronic liver disease, work with hepatitis A-infected animals, work in hepatitis A research labs, or travel to or work in countries with a high rate of hepatitis A should be immunized. Adults who were previously unvaccinated and who anticipate close contact with an international adoptee during the first 60 days after arrival in the [place] from a country with a high rate of hepatitis A should be immunized.  Hepatitis B vaccine. Adults should be immunized if they wish to be protected from this disease, are under age 75 years and have diabetes, have chronic liver disease, have had more than one sex partner in the past 6 months, may be exposed to blood or other infectious body fluids, are household contacts or sex partners of hepatitis B positive people, are clients or workers in certain care facilities, or travel to or work in countries with a high rate of hepatitis B.  Haemophilus influenzae type b (Hib) vaccine. A previously unvaccinated person with asplenia or sickle cell disease or having a scheduled splenectomy should receive 1 dose of Hib vaccine. Regardless of previous immunization, a recipient of a hematopoietic stem cell transplant should receive a 3-dose series 6-12 months after his successful transplant. Hib vaccine is not recommended  for adults with HIV infection. Preventive Service / Frequency Ages 87 to 27  Blood pressure check.** / Every 3-5 years.  Lipid and cholesterol check.** / Every 5 years beginning at age 50.  Hepatitis C blood test.** / For any individual with known risks for hepatitis C.  Skin self-exam. / Monthly.  Influenza vaccine. / Every year.  Tetanus, diphtheria, and acellular pertussis (Tdap, Td) vaccine.** / Consult your health care provider. 1 dose of Td every 10 years.  Varicella vaccine.** / Consult your health care provider.  HPV vaccine. / 3 doses over 6 months, if 32 or younger.  Measles, mumps, rubella (MMR) vaccine.** / You need at least 1 dose of MMR if you were born in 1957 or later. You may also need a second dose.  Pneumococcal 13-valent conjugate (PCV13) vaccine.** / Consult your health care provider.  Pneumococcal polysaccharide (PPSV23) vaccine.** / 1 to 2 doses if you smoke cigarettes or if you have certain conditions.  Meningococcal vaccine.** / 1 dose if you are age 5 to 96 years and a Market researcher living in a residence hall, or have one of several medical conditions. You may also need additional booster doses.  Hepatitis A vaccine.** / Consult your health care provider.  Hepatitis B vaccine.** / Consult your health care provider.  Haemophilus influenzae type b (Hib) vaccine.** / Consult your health care provider. Ages 16 to 44  Blood pressure check.** / Every year.  Lipid and cholesterol check.** / Every 5 years beginning at age 13.  Lung cancer screening. / Every year if you are aged 34-80 years and have a 30-pack-year history of smoking and currently smoke or have quit within the past 15 years. Yearly screening is stopped once you have quit smoking for at least 15 years or develop a health problem that would prevent you from having lung cancer treatment.  Fecal occult blood test (  FOBT) of stool. / Every year beginning at age 64 and continuing until  age 38. You may not have to do this test if you get a colonoscopy every 10 years.  Flexible sigmoidoscopy** or colonoscopy.** / Every 5 years for a flexible sigmoidoscopy or every 10 years for a colonoscopy beginning at age 37 and continuing until age 83.  Hepatitis C blood test.** / For all people born from [birth year] through 1964 and any individual with known risks for hepatitis C.  Skin self-exam. / Monthly.  Influenza vaccine. / Every year.  Tetanus, diphtheria, and acellular pertussis (Tdap/Td) vaccine.** / Consult your health care provider. 1 dose of Td every 10 years.  Varicella vaccine.** / Consult your health care provider.  Zoster vaccine.** / 1 dose for adults aged 25 years or older.  Measles, mumps, rubella (MMR) vaccine.** / You need at least 1 dose of MMR if you were born in 1957 or later. You may also need a second dose.  Pneumococcal 13-valent conjugate (PCV13) vaccine.** / Consult your health care provider.  Pneumococcal polysaccharide (PPSV23) vaccine.** / 1 to 2 doses if you smoke cigarettes or if you have certain conditions.  Meningococcal vaccine.** / Consult your health care provider.  Hepatitis A vaccine.** / Consult your health care provider.  Hepatitis B vaccine.** / Consult your health care provider.  Haemophilus influenzae type b (Hib) vaccine.** / Consult your health care provider. Ages 32 and over  Blood pressure check.** / Every year.  Lipid and cholesterol check.**/ Every 5 years beginning at age 15.  Lung cancer screening. / Every year if you are aged 34-80 years and have a 30-pack-year history of smoking and currently smoke or have quit within the past 15 years. Yearly screening is stopped once you have quit smoking for at least 15 years or develop a health problem that would prevent you from having lung cancer treatment.  Fecal occult blood test (FOBT) of stool. / Every year beginning at age 49 and continuing until age 65. You may not have to do this  test if you get a colonoscopy every 10 years.  Flexible sigmoidoscopy** or colonoscopy.** / Every 5 years for a flexible sigmoidoscopy or every 10 years for a colonoscopy beginning at age 31 and continuing until age 12.  Hepatitis C blood test.** / For all people born from [birth year] through 1964 and any individual with known risks for hepatitis C.  Abdominal aortic aneurysm (AAA) screening.** / A one-time screening for ages 68 to 39 years who are current or former smokers.  Skin self-exam. / Monthly.  Influenza vaccine. / Every year.  Tetanus, diphtheria, and acellular pertussis (Tdap/Td) vaccine.** / 1 dose of Td every 10 years.  Varicella vaccine.** / Consult your health care provider.  Zoster vaccine.** / 1 dose for adults aged 41 years or older.  Pneumococcal 13-valent conjugate (PCV13) vaccine.** / 1 dose for all adults aged 68 years and older.  Pneumococcal polysaccharide (PPSV23) vaccine.** / 1 dose for all adults aged 57 years and older.  Meningococcal vaccine.** / Consult your health care provider.  Hepatitis A vaccine.** / Consult your health care provider.  Hepatitis B vaccine.** / Consult your health care provider.  Haemophilus influenzae type b (Hib) vaccine.** / Consult your health care provider. **Family history and personal history of risk and conditions may change your health care provider's recommendations.   This information is not intended to replace advice given to you by your health care provider. Make sure you discuss any questions you  have with your health care provider.   Document Released: 08/06/2001 Document Revised: 07/01/2014 Document Reviewed: 11/05/2010 Elsevier Interactive Patient Education 2016 [place] all medications as directed. Continue to abstain from tobacco and reduce alcohol use. Increase regular walking. We will call when lab results are available. Follow-up with Neurology as directed. Follow-up 5 months, sooner if  needed. NICE TO SEE YOU!

## 2017-08-25 NOTE — Assessment & Plan Note (Signed)
Stable Denies thoughts of harming himself/others Currently on Paroxetine 40mg  QD, Wellbutrin 150mg  QD, and Clonazepam 1mg  BID We discussed eventually weaning him off clonazepam

## 2017-08-26 ENCOUNTER — Encounter: Payer: Self-pay | Admitting: Internal Medicine

## 2017-08-26 LAB — TSH: TSH: 1.83 u[IU]/mL (ref 0.450–4.500)

## 2017-08-26 LAB — ALT: ALT: 37 IU/L (ref 0–44)

## 2017-09-03 DIAGNOSIS — N486 Induration penis plastica: Secondary | ICD-10-CM | POA: Diagnosis not present

## 2017-09-04 DIAGNOSIS — N486 Induration penis plastica: Secondary | ICD-10-CM | POA: Diagnosis not present

## 2017-09-15 ENCOUNTER — Ambulatory Visit (INDEPENDENT_AMBULATORY_CARE_PROVIDER_SITE_OTHER): Payer: 59 | Admitting: Internal Medicine

## 2017-09-15 ENCOUNTER — Encounter: Payer: Self-pay | Admitting: Internal Medicine

## 2017-09-15 VITALS — BP 128/86 | HR 82 | Ht 72.5 in | Wt 212.0 lb

## 2017-09-15 DIAGNOSIS — I639 Cerebral infarction, unspecified: Secondary | ICD-10-CM | POA: Diagnosis not present

## 2017-09-15 NOTE — Patient Instructions (Addendum)
Medication Instructions:  Your physician recommends that you continue on your current medications as directed. Please refer to the Current Medication list given to you today.   Testing/Procedures:   Your physician has requested that you have a TEE. During a TEE, sound waves are used to create images of your heart. It provides your doctor with information about the size and shape of your heart and how well your heart's chambers and valves are working. In this test, a transducer is attached to the end of a flexible tube that's guided down your throat and into your esophagus (the tube leading from you mouth to your stomach) to get a more detailed image of your heart. You are not awake for the procedure. Please see the instruction sheet given to you today. For further information please visit [email or web address].    You are scheduled for a TEE on 09/25/2017 with Dr. Radford Pax.  Please arrive at the [facility] (Main Entrance A) at [facility]: [address] [place], [place] [address] at 10:00 am.   Dennis Bast will have a loop recorder implanted after your TEE.     DIET: Nothing to eat or drink after midnight the night before your procedure.   Medication Instructions:  Do not take any medications the morning of your procedure.   Labs:  No labs are needed   You must have a responsible person to drive you home and stay in the waiting area during your procedure. Failure to do so could result in cancellation.   Bring your insurance cards.   *Special Note: Every effort is made to have your procedure done on time. Occasionally there are emergencies that occur at the hospital that may cause delays. Please be patient if a delay does occur.    Follow-Up: You will follow up with the device clinic in 10-14 days after your procedure for a wound check.   Dr. [name] will follow remotely.  If we need to see you we will call you.   Any Other Special Instructions Will Be Listed Below (If Applicable).   If you  need a refill on your cardiac medications before your next appointment, please call your pharmacy.

## 2017-09-15 NOTE — H&P (View-Only) (Signed)
ELECTROPHYSIOLOGY CONSULT NOTE  Patient ID: Steven Herrera MRN: [id number], DOB/AGE: 24-Sep-1970   Date of Consult: 09/15/2017  Primary Physician: [name], NP   Reason for Consultation: Cryptogenic stroke; recommendations regarding Implantable Loop Recorder (referred by Dr [name])  History of Present Illness Steven Herrera was admitted on 06/26/17 with acute CVA.  He was evaluated by neurology.  He was found to have multiple infarcts on the L with possible thrombosis of the L MCA. he was monitored on telemetry which has demonstrated no arrhythmias. No cause has been identified. He left the hospital prior to having TEE or LINQ.  He has done reasonably well since.  Making good recovery.  No new neuro symptoms.  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  He has been seen by Dr [name] for stroke follow-up.  He is advised to have TEE and LINQ implantation.  Past Medical History Past Medical History:  Diagnosis Date  . High cholesterol   . Stroke [facility])     Past Surgical History:  Procedure Laterality Date  . CARPAL TUNNEL RELEASE    . MOUTH SURGERY    . spider bite    . VASECTOMY      Allergies  Allergen Reactions  . Ibuprofen Diarrhea  . Penicillins     Has patient had a PCN reaction causing immediate rash, facial/tongue/throat swelling, SOB or lightheadedness with hypotension: Yes Has patient had a PCN reaction causing severe rash involving mucus membranes or skin necrosis: no Has patient had a PCN reaction that required hospitalization: no Has patient had a PCN reaction occurring within the last 10 years: no If all of the above answers are "NO", then may proceed with Cephalosporin use.   Medicines are reviewed   Social History Social History   Socioeconomic History  . Marital status: Married    Spouse name: Not on file  . Number of children: Not on file  . Years of education: Not on file  . Highest education level: Not on file  Occupational History  .  Not on file  Social Needs  . Financial resource strain: Not on file  . Food insecurity:    Worry: Not on file    Inability: Not on file  . Transportation needs:    Medical: Not on file    Non-medical: Not on file  Tobacco Use  . Smoking status: Former Smoker    Packs/day: 0.25    Years: 28.00    Pack years: 7.00    Types: Cigarettes    Last attempt to quit: 07/25/2017    Years since quitting: 0.1  . Smokeless tobacco: Never Used  Substance and Sexual Activity  . Alcohol use: Yes    Alcohol/week: 0.6 oz    Types: 1 Shots of liquor per week    Comment: 0.5 pints nightly, daily  . Drug use: Yes    Types: Marijuana    Comment: takes daily for pain leisure   . Sexual activity: Yes    Birth control/protection: Surgical  Lifestyle  . Physical activity:    Days per week: Not on file    Minutes per session: Not on file  . Stress: Not on file  Relationships  . Social connections:    Talks on phone: Not on file    Gets together: Not on file    Attends religious service: Not on file    Active member of club or organization: Not on file    Attends meetings of  clubs or organizations: Not on file    Relationship status: Not on file  . Intimate partner violence:    Fear of current or ex partner: Not on file    Emotionally abused: Not on file    Physically abused: Not on file    Forced sexual activity: Not on file  Other Topics Concern  . Not on file  Social History Narrative  . Not on file    Review of Systems General: No chills, fever, night sweats or weight changes  Cardiovascular:  No chest pain, dyspnea on exertion, edema, orthopnea, palpitations, paroxysmal nocturnal dyspnea Dermatological: No rash, lesions or masses Respiratory: No cough, dyspnea Urologic: No hematuria, dysuria Abdominal: No nausea, vomiting, diarrhea, bright red blood per rectum, melena, or hematemesis Neurologic: No visual changes, weakness, changes in mental status All other systems reviewed and are  otherwise negative except as noted above.  Physical Exam Blood pressure 128/86, pulse 82, height 6' 0.5" (1.842 m), weight 212 lb (96.2 kg), SpO2 97 %.  General: Well developed, well appearing 47 y.o. male in no acute distress. HEENT: Normocephalic, atraumatic. EOMs intact. Sclera nonicteric. Oropharynx clear.  Neck: Supple without bruits. No JVD. Lungs: Respirations regular and unlabored, CTA bilaterally. No wheezes, rales or rhonchi. Heart: RRR. S1, S2 present. No murmurs, rub, S3 or S4. Abdomen: Soft, non-tender, non-distended. BS present x 4 quadrants. No hepatosplenomegaly.  Extremities: No clubbing, cyanosis or edema. DP/PT/Radials 2+ and equal bilaterally. Psych: Normal affect. Neuro: Alert and oriented X 3. Moves all extremities spontaneously. Musculoskeletal: No kyphosis. Skin: Intact. Warm and dry. No rashes or petechiae in exposed areas. Numerous tattoos.   Labs Lab Results  Component Value Date   WBC 7.5 06/28/2017   HGB 17.7 (H) 06/28/2017   HCT 51.4 06/28/2017   MCV 95.0 06/28/2017   PLT 186 06/28/2017     Echocardiogram  Ef 50-55%, no PFO  12-lead ECG sinus rhythm  Assessment and Plan 1. Cryptogenic stroke The patient has had multiple embolic strokes of unknown cause.  I agree with Dr [name] that further CV workup is indicated. I will arrange outpatient TEE.  If the TEE is negative, I would recommend loop recorder insertion to monitor for AF. The indication for loop recorder insertion / monitoring for AF in setting of cryptogenic stroke was discussed with the patient. The loop recorder insertion procedure was reviewed in detail including risks and benefits. These risks include but are not limited to bleeding and infection. The patient expressed verbal understanding and agrees to proceed. The patient was also counseled regarding wound care and device follow-up. We will schedule TEE and ILR implant at that next available time.  Army Fossa MD 09/15/2017,  11:46 AM

## 2017-09-15 NOTE — Progress Notes (Signed)
ELECTROPHYSIOLOGY CONSULT NOTE  Patient ID: RYOTT RAFFERTY MRN: [id number], DOB/AGE: 03/05/1971   Date of Consult: 09/15/2017  Primary Physician: [name], NP   Reason for Consultation: Cryptogenic stroke; recommendations regarding Implantable Loop Recorder (referred by Dr [name])  History of Present Illness IRINEO GAULIN was admitted on 06/26/17 with acute CVA.  He was evaluated by neurology.  He was found to have multiple infarcts on the L with possible thrombosis of the L MCA. he was monitored on telemetry which has demonstrated no arrhythmias. No cause has been identified. He left the hospital prior to having TEE or LINQ.  He has done reasonably well since.  Making good recovery.  No new neuro symptoms.  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  He has been seen by Dr [name] for stroke follow-up.  He is advised to have TEE and LINQ implantation.  Past Medical History Past Medical History:  Diagnosis Date  . High cholesterol   . Stroke [facility])     Past Surgical History:  Procedure Laterality Date  . CARPAL TUNNEL RELEASE    . MOUTH SURGERY    . spider bite    . VASECTOMY      Allergies  Allergen Reactions  . Ibuprofen Diarrhea  . Penicillins     Has patient had a PCN reaction causing immediate rash, facial/tongue/throat swelling, SOB or lightheadedness with hypotension: Yes Has patient had a PCN reaction causing severe rash involving mucus membranes or skin necrosis: no Has patient had a PCN reaction that required hospitalization: no Has patient had a PCN reaction occurring within the last 10 years: no If all of the above answers are "NO", then may proceed with Cephalosporin use.   Medicines are reviewed   Social History Social History   Socioeconomic History  . Marital status: Married    Spouse name: Not on file  . Number of children: Not on file  . Years of education: Not on file  . Highest education level: Not on file  Occupational History  .  Not on file  Social Needs  . Financial resource strain: Not on file  . Food insecurity:    Worry: Not on file    Inability: Not on file  . Transportation needs:    Medical: Not on file    Non-medical: Not on file  Tobacco Use  . Smoking status: Former Smoker    Packs/day: 0.25    Years: 28.00    Pack years: 7.00    Types: Cigarettes    Last attempt to quit: 07/25/2017    Years since quitting: 0.1  . Smokeless tobacco: Never Used  Substance and Sexual Activity  . Alcohol use: Yes    Alcohol/week: 0.6 oz    Types: 1 Shots of liquor per week    Comment: 0.5 pints nightly, daily  . Drug use: Yes    Types: Marijuana    Comment: takes daily for pain leisure   . Sexual activity: Yes    Birth control/protection: Surgical  Lifestyle  . Physical activity:    Days per week: Not on file    Minutes per session: Not on file  . Stress: Not on file  Relationships  . Social connections:    Talks on phone: Not on file    Gets together: Not on file    Attends religious service: Not on file    Active member of club or organization: Not on file    Attends meetings of  clubs or organizations: Not on file    Relationship status: Not on file  . Intimate partner violence:    Fear of current or ex partner: Not on file    Emotionally abused: Not on file    Physically abused: Not on file    Forced sexual activity: Not on file  Other Topics Concern  . Not on file  Social History Narrative  . Not on file    Review of Systems General: No chills, fever, night sweats or weight changes  Cardiovascular:  No chest pain, dyspnea on exertion, edema, orthopnea, palpitations, paroxysmal nocturnal dyspnea Dermatological: No rash, lesions or masses Respiratory: No cough, dyspnea Urologic: No hematuria, dysuria Abdominal: No nausea, vomiting, diarrhea, bright red blood per rectum, melena, or hematemesis Neurologic: No visual changes, weakness, changes in mental status All other systems reviewed and are  otherwise negative except as noted above.  Physical Exam Blood pressure 128/86, pulse 82, height 6' 0.5" (1.842 m), weight 212 lb (96.2 kg), SpO2 97 %.  General: Well developed, well appearing 47 y.o. male in no acute distress. HEENT: Normocephalic, atraumatic. EOMs intact. Sclera nonicteric. Oropharynx clear.  Neck: Supple without bruits. No JVD. Lungs: Respirations regular and unlabored, CTA bilaterally. No wheezes, rales or rhonchi. Heart: RRR. S1, S2 present. No murmurs, rub, S3 or S4. Abdomen: Soft, non-tender, non-distended. BS present x 4 quadrants. No hepatosplenomegaly.  Extremities: No clubbing, cyanosis or edema. DP/PT/Radials 2+ and equal bilaterally. Psych: Normal affect. Neuro: Alert and oriented X 3. Moves all extremities spontaneously. Musculoskeletal: No kyphosis. Skin: Intact. Warm and dry. No rashes or petechiae in exposed areas. Numerous tattoos.   Labs Lab Results  Component Value Date   WBC 7.5 06/28/2017   HGB 17.7 (H) 06/28/2017   HCT 51.4 06/28/2017   MCV 95.0 06/28/2017   PLT 186 06/28/2017     Echocardiogram  Ef 50-55%, no PFO  12-lead ECG sinus rhythm  Assessment and Plan 1. Cryptogenic stroke The patient has had multiple embolic strokes of unknown cause.  I agree with Dr [name] that further CV workup is indicated. I will arrange outpatient TEE.  If the TEE is negative, I would recommend loop recorder insertion to monitor for AF. The indication for loop recorder insertion / monitoring for AF in setting of cryptogenic stroke was discussed with the patient. The loop recorder insertion procedure was reviewed in detail including risks and benefits. These risks include but are not limited to bleeding and infection. The patient expressed verbal understanding and agrees to proceed. The patient was also counseled regarding wound care and device follow-up. We will schedule TEE and ILR implant at that next available time.  Army Fossa MD 09/15/2017,  11:46 AM

## 2017-09-23 ENCOUNTER — Other Ambulatory Visit: Payer: Self-pay | Admitting: Adult Health

## 2017-09-24 MED FILL — clonazePAM 1 MG TABS: 1 | 30 days supply | Qty: 60 | Fill #0

## 2017-09-24 NOTE — Telephone Encounter (Signed)
Per your last office note, pt to be weaned off of clonazepam.  Please advise.  Charyl Bigger, CMA

## 2017-09-24 NOTE — Telephone Encounter (Signed)
[name], Correct, our ultimate goal is to wean him off Clonazepam- will begin that after his cardiac/neuro testing.  Will try to reduce dosage this summer. Will approve RF at this time. Thanks! Valetta Fuller

## 2017-09-25 ENCOUNTER — Ambulatory Visit (HOSPITAL_BASED_OUTPATIENT_CLINIC_OR_DEPARTMENT_OTHER)
Admission: RE | Admit: 2017-09-25 | Discharge: 2017-09-25 | Disposition: A | Discharge status: Home / Self Care | Payer: 59 | Source: Ambulatory Visit | Attending: Internal Medicine | Admitting: Internal Medicine

## 2017-09-25 ENCOUNTER — Encounter (HOSPITAL_COMMUNITY)
Admission: RE | Disposition: A | Discharge status: Home / Self Care | Payer: Self-pay | Source: Ambulatory Visit | Attending: Cardiology

## 2017-09-25 ENCOUNTER — Ambulatory Visit (HOSPITAL_COMMUNITY): Payer: 59 | Admitting: Certified Registered Nurse Anesthetist

## 2017-09-25 ENCOUNTER — Encounter (HOSPITAL_COMMUNITY): Payer: Self-pay | Admitting: Emergency Medicine

## 2017-09-25 ENCOUNTER — Ambulatory Visit (HOSPITAL_COMMUNITY)
Admission: RE | Admit: 2017-09-25 | Discharge: 2017-09-25 | Disposition: A | Discharge status: Home / Self Care | Payer: 59 | Source: Ambulatory Visit | Attending: Cardiology | Admitting: Cardiology

## 2017-09-25 ENCOUNTER — Other Ambulatory Visit: Payer: Self-pay

## 2017-09-25 DIAGNOSIS — I6389 Other cerebral infarction: Secondary | ICD-10-CM | POA: Insufficient documentation

## 2017-09-25 DIAGNOSIS — Z87891 Personal history of nicotine dependence: Secondary | ICD-10-CM | POA: Insufficient documentation

## 2017-09-25 DIAGNOSIS — E7800 Pure hypercholesterolemia, unspecified: Secondary | ICD-10-CM | POA: Insufficient documentation

## 2017-09-25 DIAGNOSIS — Z880 Allergy status to penicillin: Secondary | ICD-10-CM | POA: Diagnosis not present

## 2017-09-25 DIAGNOSIS — I517 Cardiomegaly: Secondary | ICD-10-CM | POA: Diagnosis not present

## 2017-09-25 DIAGNOSIS — I639 Cerebral infarction, unspecified: Secondary | ICD-10-CM | POA: Diagnosis not present

## 2017-09-25 DIAGNOSIS — I081 Rheumatic disorders of both mitral and tricuspid valves: Secondary | ICD-10-CM | POA: Diagnosis not present

## 2017-09-25 DIAGNOSIS — F419 Anxiety disorder, unspecified: Secondary | ICD-10-CM | POA: Insufficient documentation

## 2017-09-25 DIAGNOSIS — F418 Other specified anxiety disorders: Secondary | ICD-10-CM | POA: Diagnosis not present

## 2017-09-25 DIAGNOSIS — F1290 Cannabis use, unspecified, uncomplicated: Secondary | ICD-10-CM | POA: Diagnosis not present

## 2017-09-25 DIAGNOSIS — Q211 Atrial septal defect: Secondary | ICD-10-CM | POA: Diagnosis not present

## 2017-09-25 HISTORY — PX: LOOP RECORDER INSERTION: EP1214

## 2017-09-25 HISTORY — PX: TEE WITHOUT CARDIOVERSION: SHX5443

## 2017-09-25 SURGERY — LOOP RECORDER INSERTION

## 2017-09-25 SURGERY — ECHOCARDIOGRAM, TRANSESOPHAGEAL
Anesthesia: Monitor Anesthesia Care

## 2017-09-25 MED ORDER — PROMETHAZINE HCL 25 MG/ML IJ SOLN: 6.2500 mg | INTRAMUSCULAR | Status: DC | PRN

## 2017-09-25 MED ORDER — OXYCODONE HCL 5 MG/5ML PO SOLN: 5.0000 mg | Freq: Once | ORAL | Status: DC | PRN

## 2017-09-25 MED ORDER — LIDOCAINE-EPINEPHRINE 1 %-1:100000 IJ SOLN
INTRAMUSCULAR | Status: AC
  Filled 2017-09-25: qty 1

## 2017-09-25 MED ORDER — LIDOCAINE 2% (20 MG/ML) 5 ML SYRINGE
INTRAMUSCULAR | Status: DC | PRN
  Administered 2017-09-25: 40 mg via INTRAVENOUS

## 2017-09-25 MED ORDER — BUTAMBEN-TETRACAINE-BENZOCAINE 2-2-14 % EX AERO
INHALATION_SPRAY | CUTANEOUS | Status: DC | PRN
  Administered 2017-09-25: 2 via TOPICAL

## 2017-09-25 MED ORDER — LIDOCAINE-EPINEPHRINE 1 %-1:100000 IJ SOLN
INTRAMUSCULAR | Status: DC | PRN
  Administered 2017-09-25: 10 mL

## 2017-09-25 MED ORDER — OXYCODONE HCL 5 MG PO TABS: 5.0000 mg | ORAL_TABLET | Freq: Once | ORAL | Status: DC | PRN

## 2017-09-25 MED ORDER — PROPOFOL 500 MG/50ML IV EMUL
INTRAVENOUS | Status: DC | PRN
  Administered 2017-09-25: 100 ug/kg/min via INTRAVENOUS

## 2017-09-25 MED ORDER — PROPOFOL 10 MG/ML IV BOLUS
INTRAVENOUS | Status: DC | PRN
  Administered 2017-09-25: 50 mg via INTRAVENOUS
  Administered 2017-09-25: 20 mg via INTRAVENOUS
  Administered 2017-09-25: 30 mg via INTRAVENOUS

## 2017-09-25 MED ORDER — SODIUM CHLORIDE 0.9 % IV SOLN
INTRAVENOUS | Status: AC | PRN
  Administered 2017-09-25: 500 mL via INTRAVENOUS

## 2017-09-25 MED ORDER — FENTANYL CITRATE (PF) 100 MCG/2ML IJ SOLN: 25.0000 ug | INTRAMUSCULAR | Status: DC | PRN

## 2017-09-25 SURGICAL SUPPLY — 2 items
LOOP REVEAL LINQSYS (Prosthesis & Implant Heart) ×3 IMPLANT
PACK LOOP INSERTION (CUSTOM PROCEDURE TRAY) ×3 IMPLANT

## 2017-09-25 NOTE — Transfer of Care (Signed)
Immediate Anesthesia Transfer of Care Note  Patient: Steven Herrera  Procedure(s) Performed: TRANSESOPHAGEAL ECHOCARDIOGRAM (TEE) WITH LOOP (N/A )  Patient Location: Endoscopy Unit  Anesthesia Type:MAC  Level of Consciousness: drowsy and patient cooperative  Airway & Oxygen Therapy: Patient Spontanous Breathing  Post-op Assessment: Report given to RN  Post vital signs: Reviewed and stable  Last Vitals:  Vitals Value Taken Time  BP 110/68 09/25/2017 11:41 AM  Temp    Pulse 77 09/25/2017 11:42 AM  Resp 21 09/25/2017 11:42 AM  SpO2 94 % 09/25/2017 11:42 AM  Vitals shown include unvalidated device data.  Last Pain:  Vitals:   09/25/17 1059  TempSrc:   PainSc: 0-No pain         Complications: No apparent anesthesia complications

## 2017-09-25 NOTE — Discharge Instructions (Signed)

## 2017-09-25 NOTE — Anesthesia Procedure Notes (Signed)
Procedure Name: MAC Date/Time: 09/25/2017 11:02 AM Performed by: Barrington Ellison, CRNA Pre-anesthesia Checklist: Patient identified, Emergency Drugs available, Suction available, Patient being monitored and Timeout performed Patient Re-evaluated:Patient Re-evaluated prior to induction Oxygen Delivery Method: Nasal cannula

## 2017-09-25 NOTE — Interval H&P Note (Signed)
History and Physical Interval Note:  09/25/2017 10:24 AM  Steven Herrera  has presented today for surgery, with the diagnosis of stroke  The various methods of treatment have been discussed with the patient and family. After consideration of risks, benefits and other options for treatment, the patient has consented to  Procedure(s): LOOP RECORDER INSERTION (N/A) as a surgical intervention .  The patient's history has been reviewed, patient examined, no change in status, stable for surgery.  I have reviewed the patient's chart and labs.  Questions were answered to the patient's satisfaction.     [name]

## 2017-09-25 NOTE — Anesthesia Postprocedure Evaluation (Signed)
Anesthesia Post Note  Patient: Steven Herrera  Procedure(s) Performed: TRANSESOPHAGEAL ECHOCARDIOGRAM (TEE) WITH LOOP (N/A )     Patient location during evaluation: PACU Anesthesia Type: MAC Level of consciousness: awake and alert Pain management: pain level controlled Vital Signs Assessment: post-procedure vital signs reviewed and stable Respiratory status: spontaneous breathing, nonlabored ventilation and respiratory function stable Cardiovascular status: stable and blood pressure returned to baseline Anesthetic complications: no    Last Vitals:  Vitals:   09/25/17 1200 09/25/17 1210  BP: (!) 124/93 112/69  Pulse: 64 60  Resp: 11 20  Temp:    SpO2: 97% 97%    Last Pain:  Vitals:   09/25/17 1210  TempSrc:   PainSc: 0-No pain                 Audry Pili

## 2017-09-25 NOTE — CV Procedure (Addendum)
TRANSESOPHAGEAL ECHOCARDIOGRAM (TEE) NOTE  INDICATIONS: cryptogenic stroke  PROCEDURE:   Informed consent was obtained prior to the procedure. The risks, benefits and alternatives for the procedure were discussed and the patient comprehended these risks.  Risks include, but are not limited to, cough, sore throat, vomiting, nausea, somnolence, esophageal and stomach trauma or perforation, bleeding, low blood pressure, aspiration, pneumonia, infection, trauma to the teeth and death.    After a procedural time-out, the patient was given propofol per anesthesia for sedation.  The patient's heart rate, blood pressure, and oxygen saturation are monitored continuously during the procedure.The oropharynx was anesthetized with topical cetacaine.  The transesophageal probe was inserted in the esophagus and stomach without difficulty and multiple views were obtained.  The patient was kept under observation until the patient left the procedure room.  The patient left the procedure room in stable condition.   Agitated microbubble saline contrast was administered.  COMPLICATIONS:    There were no immediate complications.  Findings:  1. LEFT VENTRICLE: The left ventricular wall thickness is normal.  The left ventricular cavity is normal in size. Wall motion is normal.  LVEF is 60-65%.  2. RIGHT VENTRICLE:  The right ventricle is normal in structure and function without any thrombus or masses.    3. LEFT ATRIUM:  The left atrium is normal in size without any thrombus or masses.  There is not spontaneous echo contrast ("smoke") in the left atrium consistent with a low flow state.  4. LEFT ATRIAL APPENDAGE:  The left atrial appendage is free of any thrombus or masses. The appendage has single lobes. Pulse doppler indicates high flow in the appendage.  5. ATRIAL SEPTUM:  The atrial septum appears is aneurysmal. Visualized with 2D and 3D imaging. There is a small PFO with right to left flow by saline  microbubble contrast, but no left to right flow by color doppler.  6. RIGHT ATRIUM:  The right atrium is normal in size and function without any thrombus or masses.  7. MITRAL VALVE:  The mitral valve is normal in structure and function with trivial regurgitation.  There were no vegetations or stenosis.  8. AORTIC VALVE:  The aortic valve is trileaflet, normal in structure and function with no regurgitation.  There were no vegetations or stenosis  9. TRICUSPID VALVE:  The tricuspid valve is normal in structure and function with trivial regurgitation.  There were no vegetations or stenosis  10.  PULMONIC VALVE:  The pulmonic valve is normal in structure and function with no regurgitation.  There were no vegetations or stenosis.   11. AORTIC ARCH, ASCENDING AND DESCENDING AORTA:  There was no Ron Parker et. Al, 1992) atherosclerosis of the ascending aorta, aortic arch, or proximal descending aorta.  12. PULMONARY VEINS: Anomalous pulmonary venous return was not noted.  13. PERICARDIUM: The pericardium appeared normal and non-thickened.  There is no pericardial effusion.  IMPRESSION:   1. 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 2. No LAA thrombus 3. Trivial MR/TR 4. LVEF 65% with normal wall motion  RECOMMENDATIONS:    1.  Small PFO with right to left flow by saline microbubble contrast. In the context of stroke, consider device closure.  Time Spent Directly with the Patient:  45 minutes   Pixie Casino, MD, [facility], [place] Director of the Advanced Lipid Disorders &  Cardiovascular Risk Reduction Clinic Diplomate of the American Board of Clinical Lipidology Attending Cardiologist  Direct Dial: [phone]  Fax: [phone]  Website:  [email or web address] 09/25/2017, 11:38 AM

## 2017-09-25 NOTE — Anesthesia Preprocedure Evaluation (Addendum)
Anesthesia Evaluation  Patient identified by MRN, date of birth, ID band Patient awake    Reviewed: Allergy & Precautions, NPO status , Patient's Chart, lab work & pertinent test results  Airway Mallampati: II  TM Distance: >3 FB Neck ROM: Full    Dental  (+) Dental Advisory Given, Chipped   Pulmonary former smoker,    Pulmonary exam normal breath sounds clear to auscultation       Cardiovascular negative cardio ROS Normal cardiovascular exam Rhythm:Regular Rate:Normal  '19 TTE - EF 50% to 55%. Diastolic function is   abnormal, indeterminant grade. No septal defect or patent foramen ovale was identified.  '19 Carotid US - no stenosis b/l   Neuro/Psych Anxiety CVA    GI/Hepatic negative GI ROS, (+)     substance abuse  marijuana use,   Endo/Other  negative endocrine ROS  Renal/GU negative Renal ROS  negative genitourinary   Musculoskeletal negative musculoskeletal ROS (+)   Abdominal   Peds  Hematology negative hematology ROS (+)   Anesthesia Other Findings   Reproductive/Obstetrics                            Anesthesia Physical Anesthesia Plan  ASA: II  Anesthesia Plan: MAC   Post-op Pain Management:    Induction: Intravenous  PONV Risk Score and Plan: Propofol infusion and Treatment may vary due to age or medical condition  Airway Management Planned: Nasal Cannula and Natural Airway  Additional Equipment: None  Intra-op Plan:   Post-operative Plan:   Informed Consent: I have reviewed the patients History and Physical, chart, labs and discussed the procedure including the risks, benefits and alternatives for the proposed anesthesia with the patient or authorized representative who has indicated his/her understanding and acceptance.     Plan Discussed with: CRNA and Anesthesiologist  Anesthesia Plan Comments:         Anesthesia Quick Evaluation

## 2017-09-25 NOTE — H&P (Signed)
   INTERVAL PROCEDURE H&P  History and Physical Interval Note:  09/25/2017 11:21 AM  Steven Herrera has presented today for their planned procedure. The various methods of treatment have been discussed with the patient and family. After consideration of risks, benefits and other options for treatment, the patient has consented to the procedure.  The patients' outpatient history has been reviewed, patient examined, and no change in status from most recent office note within the past 30 days. I have reviewed the patients' chart and labs and will proceed as planned. Questions were answered to the patient's satisfaction.   Pixie Casino, MD, [facility], [place] Director of the Advanced Lipid Disorders &  Cardiovascular Risk Reduction Clinic Diplomate of the American Board of Clinical Lipidology Attending Cardiologist  Direct Dial: [phone]  Fax: [phone]  Website:  [email or web address] 09/25/2017, 11:21 AM

## 2017-09-26 ENCOUNTER — Encounter (HOSPITAL_COMMUNITY): Payer: Self-pay | Admitting: Internal Medicine

## 2017-10-06 ENCOUNTER — Ambulatory Visit (INDEPENDENT_AMBULATORY_CARE_PROVIDER_SITE_OTHER): Payer: Self-pay | Admitting: *Deleted

## 2017-10-06 DIAGNOSIS — I639 Cerebral infarction, unspecified: Secondary | ICD-10-CM

## 2017-10-06 LAB — CUP PACEART INCLINIC DEVICE CHECK
Date Time Interrogation Session: 20190415121306
Implantable Pulse Generator Implant Date: 20190404

## 2017-10-06 NOTE — Progress Notes (Signed)
LINQ wound check in the office. Steri-strips removed by patient prior to appointment. Incision edges approximated, wound without any redness, swelling or drainage. Patient educated about wound care. No episodes detected. Patient educated about Carelink monitoring. Monthly summary reports and ROV with Dr. [name] PRN.

## 2017-10-15 ENCOUNTER — Ambulatory Visit (INDEPENDENT_AMBULATORY_CARE_PROVIDER_SITE_OTHER): Payer: 59 | Admitting: Neurology

## 2017-10-15 ENCOUNTER — Encounter: Payer: Self-pay | Admitting: Neurology

## 2017-10-15 VITALS — BP 150/97 | HR 64 | Ht 72.5 in | Wt 212.0 lb

## 2017-10-15 DIAGNOSIS — I639 Cerebral infarction, unspecified: Secondary | ICD-10-CM | POA: Diagnosis not present

## 2017-10-15 DIAGNOSIS — Q211 Atrial septal defect: Secondary | ICD-10-CM

## 2017-10-15 DIAGNOSIS — Q2112 Patent foramen ovale: Secondary | ICD-10-CM

## 2017-10-15 NOTE — Progress Notes (Signed)
[facility] [address] [place]. [place] [address] [phone]       OFFICE FOLLOW-UP NOTE  Mr. Steven Herrera Date of Birth:  [birth year] Medical Record Number:  [id number]   HPI: Mr. [name] is a 47 year old Caucasian male seen today for the first office follow-up visit following hospital admission for stroke in January 2019.history is obtained from the patient and review of electronic medical records. I have personally reviewed imaging films.[name] an 47 y.o.malewith history of hypercholesterolemia, carpal tunnel,depression and anxiety. Patient presented to the emergency department on 3 January of 2018 for difficulty with hand eye coordination. She apparently took a nap on 25 June 2017 but upon waking he had some confusion, he noticed some difficulty using the cell phone as far as difficulty pushing the numbers and using it correctly. However this did make him nervous as his family has a history of strokes that she reported to the emergency department. Upon entering the patient's blood pressure was stable, chemistry panel and CBC were stable however CT of the head demonstrated a low-attenuation in the left basal ganglia which was suspicious for a possible acute ischemic infarct. This is followed up by an MRI of the brain which did not show any acute/subacute nonhemorrhagic infarct involving the leftlentiform nucleus and internal capsule along with a left caudate head. MRA was nonrevealing. Patient's LDL was unable to be calculated secondary to elevated triglycerides.  Date last known well:Date:06/25/2017 Time last known well:Time:10:00am tPA Given:No:Out of the window. Patient had been noncompliant with his medications and was smoking and drinking heavily. MRI scan of the brain showed left basal gangliaand subinsular large subcortical infarct. MRA of the brain showed no significant large vessel stenosis. Carotid ultrasound showed no  significant extracranial stenosis. Transthoracic echo showed normal ejection fraction. LDL could not be calculated as his triglycerides were quite high. Patient was not on aspirin. A single physical occupational therapy. He is very minimum deficits. He was discharged home and came back for an outpatient transesophageal echocardiogram which was done on 06/27/17 and showed a atrial septal aneurysm and patent foramen ovale. He subsequently also had outpatient loop recorder inserted on 09/25/17 by Dr. [name] insofar paroxysmal A. Fib has not yet been found. The patient does give a family history of multiple strokes in his father which began in the 37s. He denies any history of deep vein thrombosis, pulmonary embolism or migraines. 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 There is no family history of strokes at a young age.    ROS:   14 system review of systems is positive for  Joint and back pain, muscle cramps, walking difficulty, neck pain and stiffness and all other systems negative  PMH:  Past Medical History:  Diagnosis Date  . High cholesterol   . Stroke [facility])     Social History:  Social History   Socioeconomic History  . Marital status: Married    Spouse name: Not on file  . Number of children: Not on file  . Years of education: Not on file  . Highest education level: Not on file  Occupational History  . Not on file  Social Needs  . Financial resource strain: Not on file  . Food insecurity:    Worry: Not on file    Inability: Not on file  . Transportation needs:  Medical: Not on file    Non-medical: Not on file  Tobacco Use  . Smoking status: Former Smoker    Packs/day: 0.25    Years: 28.00    Pack years: 7.00    Types: Cigarettes    Last attempt to quit: 07/25/2017    Years since quitting: 0.2  . Smokeless tobacco: Never Used  Substance and Sexual  Activity  . Alcohol use: Yes    Alcohol/week: 0.6 oz    Types: 1 Shots of liquor per week    Comment: 0.5 pints nightly, daily  . Drug use: Yes    Types: Marijuana    Comment: takes daily for pain leisure   . Sexual activity: Yes    Birth control/protection: Surgical  Lifestyle  . Physical activity:    Days per week: Not on file    Minutes per session: Not on file  . Stress: Not on file  Relationships  . Social connections:    Talks on phone: Not on file    Gets together: Not on file    Attends religious service: Not on file    Active member of club or organization: Not on file    Attends meetings of clubs or organizations: Not on file    Relationship status: Not on file  . Intimate partner violence:    Fear of current or ex partner: Not on file    Emotionally abused: Not on file    Physically abused: Not on file    Forced sexual activity: Not on file  Other Topics Concern  . Not on file  Social History Narrative  . Not on file    Medications:   Current Outpatient Medications on File Prior to Visit  Medication Sig Dispense Refill  . aspirin EC 81 MG tablet Take 81 mg by mouth daily.    Marland Kitchen atorvastatin (LIPITOR) 80 MG tablet Take 1 tablet (80 mg total) by mouth daily at 6 PM. (Patient taking differently: Take 80 mg by mouth daily. ) 90 tablet 0  . buPROPion (WELLBUTRIN XL) 150 MG 24 hr tablet One tablet by mouth daily (Patient taking differently: Take 150 mg by mouth daily. ) 90 tablet 0  . clonazePAM (KLONOPIN) 1 MG tablet TAKE 1 MG BY MOUTH TWICE A DAY 60 tablet 0  . Multiple Vitamin (ONE-A-DAY MENS PO) Take 1 tablet by mouth daily.    Marland Kitchen PARoxetine (PAXIL) 40 MG tablet Take 1 tablet (40 mg total) by mouth daily. 90 tablet 0   No current facility-administered medications on file prior to visit.     Allergies:   Allergies  Allergen Reactions  . Ibuprofen Diarrhea  . Penicillins Other (See Comments)    Has patient had a PCN reaction causing immediate rash,  facial/tongue/throat swelling, SOB or lightheadedness with hypotension: Yes Has patient had a PCN reaction causing severe rash involving mucus membranes or skin necrosis: no Has patient had a PCN reaction that required hospitalization: no Has patient had a PCN reaction occurring within the last 10 years: no If all of the above answers are "NO", then may proceed with Cephalosporin use.    Physical Exam General: well developed, well nourished heavily body tattooed young Caucasian male, seated, in no evident distress Head: head normocephalic and atraumatic.  Neck: supple with no carotid or supraclavicular bruits Cardiovascular: regular rate and rhythm, no murmurs Musculoskeletal: no deformity Skin:  no rash/petichiae multiple body tattoos all over Vascular:  Normal pulses all extremities Vitals:   10/15/17 1131  BP: (!) 150/97  Pulse: 64   Neurologic Exam Mental Status: Awake and fully alert. Oriented to place and time. Recent and remote memory intact. Attention span, concentration and fund of knowledge appropriate. Mood and affect appropriate.  Cranial Nerves: Fundoscopic exam reveals sharp disc margins. Pupils equal, briskly reactive to light. Extraocular movements full without nystagmus. Visual fields full to confrontation. Hearing intact. Facial sensation intact. Face, tongue, palate moves normally and symmetrically.  Motor: Normal bulk and tone. Normal strength in all tested extremity muscles. Sensory.: intact to touch ,pinprick .position and vibratory sensation.  Coordination: Rapid alternating movements normal in all extremities. Finger-to-nose and heel-to-shin performed accurately bilaterally. Gait and Station: Arises from chair without difficulty. Stance is normal. Gait demonstrates normal stride length and balance . Able to heel, toe and tandem walk without difficulty.  Reflexes: 1+ and symmetric. Toes downgoing.   NIHSS  0 Modified Rankin  1   ASSESSMENT: 47 year old  Caucasian male with cryptogenic left basal ganglia infarct and January 2019 with vascular risk factors of tobacco abuse, hyperlipidemia, heavy alcohol intake and patent foramen ovale.    PLAN: I had a long d/w patient about his recent cryptogenic stroke,PFO risk for recurrent stroke/TIAs, personally independently reviewed imaging studies and stroke evaluation results and answered questions.Continue aspirin 81 mg daily  for secondary stroke prevention and maintain strict control of hypertension with blood pressure goal below 130/90, diabetes with hemoglobin A1c goal below 6.5% and lipids with LDL cholesterol goal below 70 mg/dL. I also advised the patient to eat a healthy diet with plenty of whole grains, cereals, fruits and vegetables, exercise regularly and maintain ideal body weight ibuprofen encouraged him to quit alcohol and complimented him on stopping chewing tobacco. Check transcranial Doppler bubble study to categorize his PFO and hypercoagulable panel labs.he has a ROPE score of 7 which gives him a 70% chance that his stroke is due to PFO and he may benefit with endovascular PFO closure.I have advised him to see Dr. [name] for PFO closure..Followup in the future with my practitioner Janett Billow in 3 months or call earlier if necessary Greater than 50% of time during this 25 minute visit was spent on counseling,explanation of diagnosis of cryptogenic stroke and PFO, planning of further management, discussion with patient and family and coordination of care Antony Contras, MD  [facility] [address] [place], [place] [address]  Phone [phone] Fax [phone] Note: This document was prepared with digital dictation and possible smart phrase technology. Any transcriptional errors that result from this process are unintentional

## 2017-10-15 NOTE — Patient Instructions (Signed)
I had a long d/w patient about his recent cryptogenic stroke,PFO risk for recurrent stroke/TIAs, personally independently reviewed imaging studies and stroke evaluation results and answered questions.Continue aspirin 81 mg daily  for secondary stroke prevention and maintain strict control of hypertension with blood pressure goal below 130/90, diabetes with hemoglobin A1c goal below 6.5% and lipids with LDL cholesterol goal below 70 mg/dL. I also advised the patient to eat a healthy diet with plenty of whole grains, cereals, fruits and vegetables, exercise regularly and maintain ideal body weight ibuprofen encouraged him to quit alcohol and complimented him on stopping chewing tobacco. Check transcranial Doppler bubble study to categorize his PFO and hypercoagulable panel labs.he has a rope score of 7 which gives him a 70% chance that his stroke is due to PFO and he may benefit with endovascular PFO closure..Followup in the future with my practitioner Janett Billow in 3 months or call earlier if necessary  Stroke Prevention Some medical conditions and behaviors are associated with a higher chance of having a stroke. You can help prevent a stroke by making nutrition, lifestyle, and other changes, including managing any medical conditions you may have. What nutrition changes can be made?  Eat healthy foods. You can do this by: ? Choosing foods high in fiber, such as fresh fruits and vegetables and whole grains. ? Eating at least 5 or more servings of fruits and vegetables a day. Try to fill half of your plate at each meal with fruits and vegetables. ? Choosing lean protein foods, such as lean cuts of meat, poultry without skin, fish, tofu, beans, and nuts. ? Eating low-fat dairy products. ? Avoiding foods that are high in salt (sodium). This can help lower blood pressure. ? Avoiding foods that have saturated fat, trans fat, and cholesterol. This can help prevent high cholesterol. ? Avoiding processed and premade  foods.  Follow your health care provider's specific guidelines for losing weight, controlling high blood pressure (hypertension), lowering high cholesterol, and managing diabetes. These may include: ? Reducing your daily calorie intake. ? Limiting your daily sodium intake to 1,500 milligrams (mg). ? Using only healthy fats for cooking, such as olive oil, canola oil, or sunflower oil. ? Counting your daily carbohydrate intake. What lifestyle changes can be made?  Maintain a healthy weight. Talk to your health care provider about your ideal weight.  Get at least 30 minutes of moderate physical activity at least 5 days a week. Moderate activity includes brisk walking, biking, and swimming.  Do not use any products that contain nicotine or tobacco, such as cigarettes and e-cigarettes. If you need help quitting, ask your health care provider. It may also be helpful to avoid exposure to secondhand smoke.  Limit alcohol intake to no more than 1 drink a day for nonpregnant women and 2 drinks a day for men. One drink equals 12 oz of beer, 5 oz of wine, or 1 oz of hard liquor.  Stop any illegal drug use.  Avoid taking birth control pills. Talk to your health care provider about the risks of taking birth control pills if: ? You are over 56 years old. ? You smoke. ? You get migraines. ? You have ever had a blood clot. What other changes can be made?  Manage your cholesterol levels. ? Eating a healthy diet is important for preventing high cholesterol. If cholesterol cannot be managed through diet alone, you may also need to take medicines. ? Take any prescribed medicines to control your cholesterol as told by  your health care provider.  Manage your diabetes. ? Eating a healthy diet and exercising regularly are important parts of managing your blood sugar. If your blood sugar cannot be managed through diet and exercise, you may need to take medicines. ? Take any prescribed medicines to control  your diabetes as told by your health care provider.  Control your hypertension. ? To reduce your risk of stroke, try to keep your blood pressure below 130/80. ? Eating a healthy diet and exercising regularly are an important part of controlling your blood pressure. If your blood pressure cannot be managed through diet and exercise, you may need to take medicines. ? Take any prescribed medicines to control hypertension as told by your health care provider. ? Ask your health care provider if you should monitor your blood pressure at home. ? Have your blood pressure checked every year, even if your blood pressure is normal. Blood pressure increases with age and some medical conditions.  Get evaluated for sleep disorders (sleep apnea). Talk to your health care provider about getting a sleep evaluation if you snore a lot or have excessive sleepiness.  Take over-the-counter and prescription medicines only as told by your health care provider. Aspirin or blood thinners (antiplatelets or anticoagulants) may be recommended to reduce your risk of forming blood clots that can lead to stroke.  Make sure that any other medical conditions you have, such as atrial fibrillation or atherosclerosis, are managed. What are the warning signs of a stroke? The warning signs of a stroke can be easily remembered as BEFAST.  B is for balance. Signs include: ? Dizziness. ? Loss of balance or coordination. ? Sudden trouble walking.  E is for eyes. Signs include: ? A sudden change in vision. ? Trouble seeing.  F is for face. Signs include: ? Sudden weakness or numbness of the face. ? The face or eyelid drooping to one side.  A is for arms. Signs include: ? Sudden weakness or numbness of the arm, usually on one side of the body.  S is for speech. Signs include: ? Trouble speaking (aphasia). ? Trouble understanding.  T is for time. ? These symptoms may represent a serious problem that is an emergency. Do not  wait to see if the symptoms will go away. Get medical help right away. Call your local emergency services (911 in the [place] Do not drive yourself to the hospital.  Other signs of stroke may include: ? A sudden, severe headache with no known cause. ? Nausea or vomiting. ? Seizure.  Where to find more information: For more information, visit:  American Stroke Association: www.strokeassociation.org  National Stroke Association: www.stroke.org  Summary  You can prevent a stroke by eating healthy, exercising, not smoking, limiting alcohol intake, and managing any medical conditions you may have.  Do not use any products that contain nicotine or tobacco, such as cigarettes and e-cigarettes. If you need help quitting, ask your health care provider. It may also be helpful to avoid exposure to secondhand smoke.  Remember BEFAST for warning signs of stroke. Get help right away if you or a loved one has any of these signs. This information is not intended to replace advice given to you by your health care provider. Make sure you discuss any questions you have with your health care provider. Document Released: 07/18/2004 Document Revised: 07/16/2016 Document Reviewed: 07/16/2016 Elsevier Interactive Patient Education  [employer].

## 2017-10-21 ENCOUNTER — Ambulatory Visit (HOSPITAL_COMMUNITY)
Admission: RE | Admit: 2017-10-21 | Discharge: 2017-10-21 | Disposition: A | Discharge status: Home / Self Care | Payer: 59 | Source: Ambulatory Visit | Attending: Neurology | Admitting: Neurology

## 2017-10-21 ENCOUNTER — Ambulatory Visit (HOSPITAL_BASED_OUTPATIENT_CLINIC_OR_DEPARTMENT_OTHER)
Admission: RE | Admit: 2017-10-21 | Discharge: 2017-10-21 | Disposition: A | Discharge status: Home / Self Care | Payer: 59 | Source: Ambulatory Visit

## 2017-10-21 ENCOUNTER — Other Ambulatory Visit: Payer: Self-pay | Admitting: Neurology

## 2017-10-21 DIAGNOSIS — Q211 Atrial septal defect: Secondary | ICD-10-CM

## 2017-10-21 DIAGNOSIS — I82403 Acute embolism and thrombosis of unspecified deep veins of lower extremity, bilateral: Secondary | ICD-10-CM

## 2017-10-21 DIAGNOSIS — Q2112 Patent foramen ovale: Secondary | ICD-10-CM

## 2017-10-21 DIAGNOSIS — I639 Cerebral infarction, unspecified: Secondary | ICD-10-CM

## 2017-10-21 NOTE — Progress Notes (Addendum)
*  PRELIMINARY RESULTS* Vascular Ultrasound Transcranial Doppler with Bubbles has been completed.   There is evidence of high intensity transient signals (HITS) at rest and with Valsalva, suggestive of medium patent foramen ovale (PFO).  Bilateral lower extremity venous duplex completed. Bilateral lower extremities are negative for deep vein thrombosis. There is no evidence of Baker's cyst bilaterally.  10/21/2017 1:52 PM Maudry Mayhew, BS, RVT, RDCS, RDMS

## 2017-10-22 DIAGNOSIS — N486 Induration penis plastica: Secondary | ICD-10-CM | POA: Diagnosis not present

## 2017-10-23 ENCOUNTER — Telehealth: Payer: Self-pay

## 2017-10-23 DIAGNOSIS — N486 Induration penis plastica: Secondary | ICD-10-CM | POA: Diagnosis not present

## 2017-10-23 NOTE — Telephone Encounter (Signed)
Notes recorded by Marval Regal, RN on 10/23/2017 at 9:31 AM EDT Rn call patient that lower extremity venous doppler showed no evidence of deep vein thrombosis.PT verbalized understanding. ------

## 2017-10-23 NOTE — Telephone Encounter (Signed)
-----   Message from Garvin Fila, MD sent at 10/22/2017  2:57 PM EDT ----- Mitchell Heir inform the patient that lower extremity venous Doppler study done yesterday showed no evidence of deep vein thrombosis

## 2017-10-24 ENCOUNTER — Other Ambulatory Visit: Payer: Self-pay | Admitting: Adult Health

## 2017-10-24 MED FILL — clonazePAM 1 MG TABS: 1 | 30 days supply | Qty: 60 | Fill #0

## 2017-10-24 MED FILL — PARoxetine HCL 40 MG TABS: 40 | 90 days supply | Qty: 90 | Fill #0

## 2017-10-24 MED FILL — buPROPion HCL ER (XL) 150 M: 150 | 90 days supply | Qty: 90 | Fill #0

## 2017-10-24 NOTE — Telephone Encounter (Signed)
Please review and refill if appropriate.  T. Sheila Ocasio, CMA  

## 2017-10-25 LAB — HYPERCOAGULABLE PANEL, COMPREHENSIVE
APTT: 26.2 s
AT III ACT/NOR PPP CHRO: 87 %
Act. Prt C Resist w/FV Defic.: 2.4 ratio
BETA-2 GLYCOPROTEIN I, IGM: 12 SMU
Beta-2 Glycoprotein I, IgA: <10 SAU
DRVVT SCREEN SECONDS: 30.4 s
FACTOR VII ANTIGEN: 164 %
FACTOR VIII ACTIVITY: 104 %
HEXAGONAL PHOSPHOLIPID NEUTRAL: 0 s
HOMOCYSTEINE: 11.3 umol/L
PROT C AG ACT/NOR PPP IMM: 100 %
PROT S AG ACT/NOR PPP IMM: 136 %
PROTEIN C AG/FVII AG RATIO: 0.6 ratio
Protein S Ag/FVII Ag Ratio**: 0.8 ratio

## 2017-10-27 ENCOUNTER — Ambulatory Visit (INDEPENDENT_AMBULATORY_CARE_PROVIDER_SITE_OTHER): Payer: 59 | Admitting: *Deleted

## 2017-10-27 ENCOUNTER — Other Ambulatory Visit: Payer: 59

## 2017-10-27 DIAGNOSIS — E785 Hyperlipidemia, unspecified: Secondary | ICD-10-CM | POA: Diagnosis not present

## 2017-10-27 DIAGNOSIS — I639 Cerebral infarction, unspecified: Secondary | ICD-10-CM

## 2017-10-28 LAB — LIPID PANEL
CHOLESTEROL TOTAL: 168 mg/dL (ref 100–199)
Chol/HDL Ratio: 6.2 ratio — ABNORMAL HIGH (ref 0.0–5.0)
HDL: 27 mg/dL — ABNORMAL LOW (ref >39)
TRIGLYCERIDES: 1262 mg/dL — AB (ref 0–149)

## 2017-10-28 NOTE — Progress Notes (Signed)
Carelink Summary Report / Loop Recorder 

## 2017-10-30 ENCOUNTER — Telehealth: Payer: Self-pay

## 2017-10-30 NOTE — Telephone Encounter (Signed)
-----   Message from Garvin Fila, MD sent at 10/28/2017  6:44 AM EDT ----- Mitchell Heir inform patient that hypercoagulable lab panel was all normal

## 2017-10-30 NOTE — Telephone Encounter (Signed)
Notes recorded by Marval Regal, RN on 10/30/2017 at 11:52 AM EDT Rn call patient about his hypercoagulable lab work. Rn stated the lab work was normal. PT verbalized understanding. ------

## 2017-11-03 ENCOUNTER — Telehealth: Payer: Self-pay | Admitting: Adult Health

## 2017-11-03 ENCOUNTER — Other Ambulatory Visit: Payer: Self-pay | Admitting: Adult Health

## 2017-11-03 MED ORDER — OMEGA-3-ACID ETHYL ESTERS 1 G PO CAPS: 2.0000 g | ORAL_CAPSULE | Freq: Two times a day (BID) | ORAL | 2 refills | Status: DC

## 2017-11-03 MED FILL — OMEGA-3 ETHYL ESTERS 1 GM C: 1 | 60 days supply | Qty: 240 | Fill #0

## 2017-11-03 NOTE — Telephone Encounter (Signed)
Please call [facility] to clarify pt's prescription for  :  omega-3 acid ethyl esters (LOVAZA) 1 g capsule [[id number]]   Order Details  Dose: 2 g Route: Oral Frequency: 2 times daily  Indications of Use: Hypertriglyceridemia  Dispense Quantity: 60 capsule Refills: 2 Fills remaining: --        Sig: Take 2 capsules (2 g total) by mouth 2 (two) times daily.          Pt uses  :  Preferred Pharmacies      [place], [place] - [place] [phone] (Phone) [phone] (Fax    ---forwarding message to medical assistant.  --glh

## 2017-11-03 NOTE — Telephone Encounter (Signed)
I amended the quantity to reflect 240 Thanks! Valetta Fuller

## 2017-11-03 NOTE — Telephone Encounter (Signed)
Please confirm that pt is to take Lovaza 2 caps BID rather than QD.  RX was sent in for 2 BID with only a quantity of #60, which would be a 15 day supply.  Charyl Bigger, CMA

## 2017-11-06 ENCOUNTER — Encounter: Payer: Self-pay | Admitting: Cardiovascular Disease

## 2017-11-06 ENCOUNTER — Ambulatory Visit (INDEPENDENT_AMBULATORY_CARE_PROVIDER_SITE_OTHER): Payer: 59 | Admitting: Cardiovascular Disease

## 2017-11-06 VITALS — BP 120/80 | HR 64 | Ht 74.0 in | Wt 215.0 lb

## 2017-11-06 DIAGNOSIS — Q211 Atrial septal defect: Secondary | ICD-10-CM

## 2017-11-06 DIAGNOSIS — Q2112 Patent foramen ovale: Secondary | ICD-10-CM

## 2017-11-06 LAB — CBC WITH DIFFERENTIAL/PLATELET
BASOS: 1 %
Basophils Absolute: 0 10*3/uL (ref 0.0–0.2)
EOS (ABSOLUTE): 0.2 10*3/uL (ref 0.0–0.4)
EOS: 2 %
HEMATOCRIT: 40.7 % (ref 37.5–51.0)
Hemoglobin: 14.6 g/dL (ref 13.0–17.7)
Immature Grans (Abs): 0 10*3/uL (ref 0.0–0.1)
Immature Granulocytes: 0 %
LYMPHS ABS: 2.6 10*3/uL (ref 0.7–3.1)
Lymphs: 36 %
MCH: 33.2 pg — ABNORMAL HIGH (ref 26.6–33.0)
MCHC: 35.9 g/dL — AB (ref 31.5–35.7)
MCV: 93 fL (ref 79–97)
MONOS ABS: 0.8 10*3/uL (ref 0.1–0.9)
Monocytes: 11 %
Neutrophils Absolute: 3.6 10*3/uL (ref 1.4–7.0)
Neutrophils: 50 %
Platelets: 244 10*3/uL (ref 150–379)
RBC: 4.4 x10E6/uL (ref 4.14–5.80)
RDW: 13.4 % (ref 12.3–15.4)
WBC: 7.3 10*3/uL (ref 3.4–10.8)

## 2017-11-06 LAB — BASIC METABOLIC PANEL
BUN / CREAT RATIO: 14 (ref 9–20)
BUN: 15 mg/dL (ref 6–24)
CALCIUM: 9.8 mg/dL (ref 8.7–10.2)
CO2: 26 mmol/L (ref 20–29)
CREATININE: 1.07 mg/dL (ref 0.76–1.27)
Chloride: 101 mmol/L (ref 96–106)
GFR calc Af Amer: 96 mL/min/{1.73_m2} (ref >59)
GFR calc non Af Amer: 83 mL/min/{1.73_m2} (ref >59)
GLUCOSE: 97 mg/dL (ref 65–99)
Potassium: 4.6 mmol/L (ref 3.5–5.2)
Sodium: 139 mmol/L (ref 134–144)

## 2017-11-06 MED ORDER — CLOPIDOGREL BISULFATE 75 MG PO TABS: 75.0000 mg | ORAL_TABLET | Freq: Every day | ORAL | 3 refills | Status: DC

## 2017-11-06 MED FILL — CLOPIDOGREL 75 MG TABLET: 75 | 90 days supply | Qty: 90 | Fill #0

## 2017-11-06 NOTE — Progress Notes (Signed)
Cardiology Office Note Date:  11/06/2017   ID:  Steven Herrera, DOB Feb 11, 1971, MRN [id number]  PCP:  [name], NP  Cardiologist:  [name], MD    Chief Complaint  Patient presents with  . Shortness of Breath     History of Present Illness: Steven Herrera is a 47 y.o. male who presents for evaluation of PFO Closure, referred by Dr [name].   He presented in January 2019 with clumsiness and confusion.  He was noted to have an acute/subacute infarct involving the left lentiform nucleus, left internal capsule, and left caudate head.  His symptoms improved and he was not left with any specific residual deficits.  The patient is here alone today.  He complains of shortness of breath with activity.  He has no other specific cardiac-related complaints.  He denies chest pain, chest pressure, leg swelling, heart palpitations, lightheadedness, or syncope.  His evaluation included a hypercoagulability panel which was negative, he had no evidence of large vessel occlusive disease or heavy aortic atherosclerosis.  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  A transcranial Doppler study demonstrated a moderate right to left shunt at the intracardiac level.  The patient is referred for consideration of transcatheter PFO closure.  Past Medical History:  Diagnosis Date  . High cholesterol   . Stroke [facility])     Past Surgical History:  Procedure Laterality Date  . CARPAL TUNNEL RELEASE    . LOOP RECORDER INSERTION N/A 09/25/2017   Procedure: LOOP RECORDER INSERTION;  Surgeon: [name], MD;  Location: [place] [facility];  Service: Cardiovascular;  Laterality: N/A;  . MOUTH SURGERY    . spider bite    . TEE WITHOUT CARDIOVERSION N/A 09/25/2017   Procedure: TRANSESOPHAGEAL ECHOCARDIOGRAM (TEE) WITH LOOP;  Surgeon: Pixie Casino, MD;  Location: [place] [facility];  Service: Cardiovascular;  Laterality: N/A;  .  VASECTOMY      Current Outpatient Medications  Medication Sig Dispense Refill  . aspirin EC 81 MG tablet Take 81 mg by mouth daily.    Marland Kitchen atorvastatin (LIPITOR) 80 MG tablet Take 1 tablet (80 mg total) by mouth daily at 6 PM. (Patient taking differently: Take 80 mg by mouth daily. ) 90 tablet 0  . buPROPion (WELLBUTRIN XL) 150 MG 24 hr tablet One tablet by mouth daily (Patient taking differently: Take 150 mg by mouth daily. ) 90 tablet 0  . clonazePAM (KLONOPIN) 1 MG tablet TAKE 1 TABLET BY MOUTH TWICE A DAY 60 tablet 0  . Multiple Vitamin (ONE-A-DAY MENS PO) Take 1 tablet by mouth daily.    Marland Kitchen omega-3 acid ethyl esters (LOVAZA) 1 g capsule Take 2 capsules (2 g total) by mouth 2 (two) times daily. 240 capsule 2  . PARoxetine (PAXIL) 40 MG tablet Take 1 tablet (40 mg total) by mouth daily. 90 tablet 0  . clopidogrel (PLAVIX) 75 MG tablet Take 1 tablet (75 mg total) by mouth daily. 90 tablet 3   No current facility-administered medications for this visit.     Allergies:   Ibuprofen and Penicillins   Social History:  The patient  reports that he quit smoking about 3 months ago. His smoking use included cigarettes. He has a 7.00 pack-year smoking history. He has never used smokeless tobacco. He reports that he drinks about 0.6 oz of alcohol per week. He reports that he has current or past drug history. Drug: Marijuana.   Family  History:  The patient's family history includes Alcohol abuse in his father; Atrial fibrillation in his mother; Dementia in his mother; Drug abuse in his sister; Healthy in his sister; Heart disease in his mother; Hypertension in his brother and father; Stroke in his father.   ROS:  Please see the history of present illness.  Otherwise, review of systems is positive for shortness of breath.  All other systems are reviewed and negative.   PHYSICAL EXAM: VS:  BP 120/80 (BP Location: Left Arm, Patient Position: Sitting, Cuff Size: Normal)   Pulse 64   Ht 6\' 2"  (1.88 m)    Wt 215 lb (97.5 kg)   SpO2 97%   BMI 27.60 kg/m  , BMI Body mass index is 27.6 kg/m. GEN: Well nourished, well developed, in no acute distress  HEENT: normal  Neck: no JVD, no masses. No carotid bruits Cardiac: RRR without murmur or gallop     Respiratory:  clear to auscultation bilaterally, normal work of breathing GI: soft, nontender, nondistended, + BS MS: no deformity or atrophy  Ext: no pretibial edema, pedal pulses 2+= bilaterally Skin: warm and dry, no rash Neuro:  Strength and sensation are intact Psych: euthymic mood, full affect  EKG:  EKG is not ordered today.  Recent Labs: 06/28/2017: BUN 11; Creatinine, Ser 1.15; Hemoglobin 17.7; Platelets 186; Potassium 4.8; Sodium 137 08/25/2017: ALT 37; TSH 1.830   Lipid Panel     Component Value Date/Time   CHOL 168 10/27/2017 1102   TRIG 1,262 (HH) 10/27/2017 1102   HDL 27 (L) 10/27/2017 1102   CHOLHDL 6.2 (H) 10/27/2017 1102   CHOLHDL 8.2 06/27/2017 0843   VLDL UNABLE TO CALCULATE IF TRIGLYCERIDE OVER 400 mg/dL 06/27/2017 0843   LDLCALC Comment 10/27/2017 1102      Wt Readings from Last 3 Encounters:  11/06/17 215 lb (97.5 kg)  10/15/17 212 lb (96.2 kg)  09/25/17 212 lb (96.2 kg)     Cardiac Studies Reviewed: TEE 09/25/2017: Study Conclusions  - Left ventricle: The cavity size was normal. There was mild   concentric hypertrophy. Systolic function was normal. The   estimated ejection fraction was in the range of 60% to 65%. Wall   motion was normal; there were no regional wall motion   abnormalities. - Aortic valve: No evidence of vegetation. - Mitral valve: Normal leaflets . There was trivial regurgitation. - Left atrium: The atrium was dilated. No evidence of thrombus in   the atrial cavity or appendage. - Right atrium: No evidence of thrombus in the atrial cavity or   appendage. - Atrial septum: Aneursymal interatrial septum. Small PFO noted   with saline microbubble contrast. The septum was visualized with    2D and 3D echo. - Tricuspid valve: No evidence of vegetation. - Pulmonic valve: No evidence of vegetation.  Impressions:  - Small PFO with right to left flow by saline microbubble contrast   was noted.  2D Echo: Study Conclusions  - Left ventricle: The cavity size was normal. Wall thickness was   normal. Systolic function was normal. The estimated ejection   fraction was in the range of 50% to 55%. Diastolic function is   abnormal, indeterminant grade. Wall motion was normal; there were   no regional wall motion abnormalities. - Aortic valve: Valve area (VTI): 3.84 cm^2. Valve area (Vmax):   3.42 cm^2. Valve area (Vmean): 3.62 cm^2. - Atrial septum: No defect or patent foramen ovale was identified.  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  Radiographic results, hospital notes, lab results, and cardiac imaging data are reviewed. TEE images are reviewed and demonstrate normal LV systolic function, no significant valvular disease, and a PFO with atrial septal aneurysm, positive saline micro cavitation study with small right to left shunt in the resting state.  I do not see evidence of a Valsalva maneuver.   The patient's Rope Score is 7, indicating a high probability that the patient's stroke is 'PFO-related.'  The patient is counseled about the association of PFO and cryptogenic stroke. Available clinical trial data is reviewed, specifically those trials comparing transcatheter PFO closure and medical therapy with antiplatelet drugs. The patient understands the potential benefit of PFO closure with respect to secondary stroke reduction compared with medical therapy alone. Specific risks of transcatheter PFO closure are reviewed with the patient. These risks include bleeding, infection, device embolization, stroke, cardiac perforation, tamponade, arrhythmia, MI, and late device erosion. He understands these serious risks occur at low incidence of < 1%.   After discussion of pros and cons of transcatheter PFO closure, the patient wishes to proceed. Full informed consent is obtained.  He will start clopidogrel in addition to aspirin 81 mg prior to the procedure.  He understands the need to continue dual antiplatelet therapy with aspirin and clopidogrel for 3 months after PFO closure.  He understands the need to follow SBE prophylaxis for 6 months.  Current medicines are reviewed with the patient today.  The patient does not have concerns regarding medicines.  Labs/ tests ordered today include:   Orders Placed This Encounter  Procedures  . Basic metabolic panel  . CBC with Differential/Platelet   Signed, [name], MD  11/06/2017 12:53 PM    [address], [place], [place]  [address] Phone: [phone]; Fax: [phone]

## 2017-11-06 NOTE — Patient Instructions (Addendum)
Medication Instructions:  1) START PLAVIX 75 mg daily on Friday, May 24.  Labwork: TODAY: BMET, CBC  Testing/Procedures: Dr. Burt Knack recommends you have a PFO CLOSURE.  Follow-Up: You have a follow up appointment with Nell Range (our Structural Heart PA) on Wednesday, June 26 at 1:30PM.  Any Other Special Instructions Will Be Listed Below (If Applicable).   [place] OFFICE [address] [place] [place] [address] Dept: [phone] Loc: [phone]  Steven Herrera  11/06/2017  You are scheduled for a PFO CLOSURE on Wednesday, May 29 with Dr. [name].  1. Please arrive at the [facility] (Main Entrance A) at [facility]: [address] [place], [place] [address] at 6:30 AM (two hours before your procedure to ensure your preparation). Free valet parking service is available.   Special note: Every effort is made to have your procedure done on time. Please understand that emergencies sometimes delay scheduled procedures.  2. Diet: Do not eat or drink anything after midnight prior to your procedure except sips of water to take medications.  3. Labs: TODAY!  4. Medication instructions in preparation for your procedure:  1) MAKE SURE TO TAKE ASPIRIN AND PLAVIX the morning of your procedure  2) you may take all other meds as directed with sips of water  5. Plan for one night stay--bring personal belongings. 6. Bring a current list of your medications and current insurance cards. 7. You MUST have a responsible person to drive you home. 8. Someone MUST be with you the first 24 hours after you arrive home or your discharge will be delayed. 9. Please wear clothes that are easy to get on and off and wear slip-on shoes.  Thank you for allowing [place] to care for you!   -- [facility]     If you need a refill on your cardiac medications before  your next appointment, please call your pharmacy.

## 2017-11-06 NOTE — H&P (View-Only) (Signed)
Cardiology Office Note Date:  11/06/2017   ID:  Steven Herrera, DOB 11-Nov-1970, MRN [id number]  PCP:  Steven Grandchild, NP  Cardiologist:  Steven Mocha, MD    Chief Complaint  Patient presents with  . Shortness of Breath     History of Present Illness: Steven Herrera is a 47 y.o. male who presents for evaluation of PFO Closure, referred by Dr Steven Herrera.   He presented in January 2019 with clumsiness and confusion.  He was noted to have an acute/subacute infarct involving the left lentiform nucleus, left internal capsule, and left caudate head.  His symptoms improved and he was not left with any specific residual deficits.  The patient is here alone today.  He complains of shortness of breath with activity.  He has no other specific cardiac-related complaints.  He denies chest pain, chest pressure, leg swelling, heart palpitations, lightheadedness, or syncope.  His evaluation included a hypercoagulability panel which was negative, he had no evidence of large vessel occlusive disease or heavy aortic atherosclerosis.  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  A transcranial Doppler study demonstrated a moderate right to left shunt at the intracardiac level.  The patient is referred for consideration of transcatheter PFO closure.  Past Medical History:  Diagnosis Date  . High cholesterol   . Stroke [facility])     Past Surgical History:  Procedure Laterality Date  . CARPAL TUNNEL RELEASE    . LOOP RECORDER INSERTION N/A 09/25/2017   Procedure: LOOP RECORDER INSERTION;  Surgeon: Steven Grayer, MD;  Location: [place] [facility];  Service: Cardiovascular;  Laterality: N/A;  . MOUTH SURGERY    . spider bite    . TEE WITHOUT CARDIOVERSION N/A 09/25/2017   Procedure: TRANSESOPHAGEAL ECHOCARDIOGRAM (TEE) WITH LOOP;  Surgeon: Steven Casino, MD;  Location: [place] [facility];  Service: Cardiovascular;  Laterality: N/A;  .  VASECTOMY      Current Outpatient Medications  Medication Sig Dispense Refill  . aspirin EC 81 MG tablet Take 81 mg by mouth daily.    Marland Kitchen atorvastatin (LIPITOR) 80 MG tablet Take 1 tablet (80 mg total) by mouth daily at 6 PM. (Patient taking differently: Take 80 mg by mouth daily. ) 90 tablet 0  . buPROPion (WELLBUTRIN XL) 150 MG 24 hr tablet One tablet by mouth daily (Patient taking differently: Take 150 mg by mouth daily. ) 90 tablet 0  . clonazePAM (KLONOPIN) 1 MG tablet TAKE 1 TABLET BY MOUTH TWICE A DAY 60 tablet 0  . Multiple Vitamin (ONE-A-DAY MENS PO) Take 1 tablet by mouth daily.    Marland Kitchen omega-3 acid ethyl esters (LOVAZA) 1 g capsule Take 2 capsules (2 g total) by mouth 2 (two) times daily. 240 capsule 2  . PARoxetine (PAXIL) 40 MG tablet Take 1 tablet (40 mg total) by mouth daily. 90 tablet 0  . clopidogrel (PLAVIX) 75 MG tablet Take 1 tablet (75 mg total) by mouth daily. 90 tablet 3   No current facility-administered medications for this visit.     Allergies:   Ibuprofen and Penicillins   Social History:  The patient  reports that he quit smoking about 3 months ago. His smoking use included cigarettes. He has a 7.00 pack-year smoking history. He has never used smokeless tobacco. He reports that he drinks about 0.6 oz of alcohol per week. He reports that he has current or past drug history. Drug: Marijuana.   Family  History:  The patient's family history includes Alcohol abuse in his father; Atrial fibrillation in his mother; Dementia in his mother; Drug abuse in his sister; Healthy in his sister; Heart disease in his mother; Hypertension in his brother and father; Stroke in his father.   ROS:  Please see the history of present illness.  Otherwise, review of systems is positive for shortness of breath.  All other systems are reviewed and negative.   PHYSICAL EXAM: VS:  BP 120/80 (BP Location: Left Arm, Patient Position: Sitting, Cuff Size: Normal)   Pulse 64   Ht 6\' 2"  (1.88 m)    Wt 215 lb (97.5 kg)   SpO2 97%   BMI 27.60 kg/m  , BMI Body mass index is 27.6 kg/m. GEN: Well nourished, well developed, in no acute distress  HEENT: normal  Neck: no JVD, no masses. No carotid bruits Cardiac: RRR without murmur or gallop     Respiratory:  clear to auscultation bilaterally, normal work of breathing GI: soft, nontender, nondistended, + BS MS: no deformity or atrophy  Ext: no pretibial edema, pedal pulses 2+= bilaterally Skin: warm and dry, no rash Neuro:  Strength and sensation are intact Psych: euthymic mood, full affect  EKG:  EKG is not ordered today.  Recent Labs: 06/28/2017: BUN 11; Creatinine, Ser 1.15; Hemoglobin 17.7; Platelets 186; Potassium 4.8; Sodium 137 08/25/2017: ALT 37; TSH 1.830   Lipid Panel     Component Value Date/Time   CHOL 168 10/27/2017 1102   TRIG 1,262 (HH) 10/27/2017 1102   HDL 27 (L) 10/27/2017 1102   CHOLHDL 6.2 (H) 10/27/2017 1102   CHOLHDL 8.2 06/27/2017 0843   VLDL UNABLE TO CALCULATE IF TRIGLYCERIDE OVER 400 mg/dL 06/27/2017 0843   LDLCALC Comment 10/27/2017 1102      Wt Readings from Last 3 Encounters:  11/06/17 215 lb (97.5 kg)  10/15/17 212 lb (96.2 kg)  09/25/17 212 lb (96.2 kg)     Cardiac Studies Reviewed: TEE 09/25/2017: Study Conclusions  - Left ventricle: The cavity size was normal. There was mild   concentric hypertrophy. Systolic function was normal. The   estimated ejection fraction was in the range of 60% to 65%. Wall   motion was normal; there were no regional wall motion   abnormalities. - Aortic valve: No evidence of vegetation. - Mitral valve: Normal leaflets . There was trivial regurgitation. - Left atrium: The atrium was dilated. No evidence of thrombus in   the atrial cavity or appendage. - Right atrium: No evidence of thrombus in the atrial cavity or   appendage. - Atrial septum: Aneursymal interatrial septum. Small PFO noted   with saline microbubble contrast. The septum was visualized with    2D and 3D echo. - Tricuspid valve: No evidence of vegetation. - Pulmonic valve: No evidence of vegetation.  Impressions:  - Small PFO with right to left flow by saline microbubble contrast   was noted.  2D Echo: Study Conclusions  - Left ventricle: The cavity size was normal. Wall thickness was   normal. Systolic function was normal. The estimated ejection   fraction was in the range of 50% to 55%. Diastolic function is   abnormal, indeterminant grade. Wall motion was normal; there were   no regional wall motion abnormalities. - Aortic valve: Valve area (VTI): 3.84 cm^2. Valve area (Vmax):   3.42 cm^2. Valve area (Vmean): 3.62 cm^2. - Atrial septum: No defect or patent foramen ovale was identified.  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  Radiographic results, hospital notes, lab results, and cardiac imaging data are reviewed. TEE images are reviewed and demonstrate normal LV systolic function, no significant valvular disease, and a PFO with atrial septal aneurysm, positive saline micro cavitation study with small right to left shunt in the resting state.  I do not see evidence of a Valsalva maneuver.   The patient's Rope Score is 7, indicating a high probability that the patient's stroke is 'PFO-related.'  The patient is counseled about the association of PFO and cryptogenic stroke. Available clinical trial data is reviewed, specifically those trials comparing transcatheter PFO closure and medical therapy with antiplatelet drugs. The patient understands the potential benefit of PFO closure with respect to secondary stroke reduction compared with medical therapy alone. Specific risks of transcatheter PFO closure are reviewed with the patient. These risks include bleeding, infection, device embolization, stroke, cardiac perforation, tamponade, arrhythmia, MI, and late device erosion. He understands these serious risks occur at low incidence of < 1%.   After discussion of pros and cons of transcatheter PFO closure, the patient wishes to proceed. Full informed consent is obtained.  He will start clopidogrel in addition to aspirin 81 mg prior to the procedure.  He understands the need to continue dual antiplatelet therapy with aspirin and clopidogrel for 3 months after PFO closure.  He understands the need to follow SBE prophylaxis for 6 months.  Current medicines are reviewed with the patient today.  The patient does not have concerns regarding medicines.  Labs/ tests ordered today include:   Orders Placed This Encounter  Procedures  . Basic metabolic panel  . CBC with Differential/Platelet   Signed, Steven Mocha, MD  11/06/2017 12:53 PM    [address], [place], [place]  [address] Phone: [phone]; Fax: [phone]

## 2017-11-11 ENCOUNTER — Other Ambulatory Visit: Payer: Self-pay | Admitting: Adult Health

## 2017-11-11 MED FILL — ATORVASTATIN 80 MG TABLET: 80 | 90 days supply | Qty: 90 | Fill #0

## 2017-11-16 ENCOUNTER — Encounter: Payer: Self-pay | Admitting: Adult Health

## 2017-11-18 ENCOUNTER — Other Ambulatory Visit: Payer: Self-pay | Admitting: Adult Health

## 2017-11-18 LAB — CUP PACEART REMOTE DEVICE CHECK
Implantable Pulse Generator Implant Date: 20190404
MDC IDC SESS DTM: 20190504174104

## 2017-11-19 ENCOUNTER — Ambulatory Visit (HOSPITAL_BASED_OUTPATIENT_CLINIC_OR_DEPARTMENT_OTHER): Payer: 59

## 2017-11-19 ENCOUNTER — Encounter (HOSPITAL_COMMUNITY)
Admission: RE | Disposition: A | Discharge status: Home / Self Care | Payer: Self-pay | Source: Ambulatory Visit | Attending: Cardiovascular Disease

## 2017-11-19 ENCOUNTER — Ambulatory Visit (HOSPITAL_COMMUNITY)
Admission: RE | Admit: 2017-11-19 | Discharge: 2017-11-19 | Disposition: A | Discharge status: Home / Self Care | Payer: 59 | Source: Ambulatory Visit | Attending: Cardiovascular Disease | Admitting: Cardiovascular Disease

## 2017-11-19 ENCOUNTER — Encounter (HOSPITAL_COMMUNITY): Payer: Self-pay | Admitting: Physician Assistant

## 2017-11-19 DIAGNOSIS — Z880 Allergy status to penicillin: Secondary | ICD-10-CM | POA: Insufficient documentation

## 2017-11-19 DIAGNOSIS — Z7982 Long term (current) use of aspirin: Secondary | ICD-10-CM | POA: Insufficient documentation

## 2017-11-19 DIAGNOSIS — Q211 Atrial septal defect: Secondary | ICD-10-CM

## 2017-11-19 DIAGNOSIS — Q2112 Patent foramen ovale: Secondary | ICD-10-CM

## 2017-11-19 DIAGNOSIS — Z7902 Long term (current) use of antithrombotics/antiplatelets: Secondary | ICD-10-CM | POA: Insufficient documentation

## 2017-11-19 DIAGNOSIS — Z8673 Personal history of transient ischemic attack (TIA), and cerebral infarction without residual deficits: Secondary | ICD-10-CM | POA: Insufficient documentation

## 2017-11-19 DIAGNOSIS — Z8249 Family history of ischemic heart disease and other diseases of the circulatory system: Secondary | ICD-10-CM | POA: Insufficient documentation

## 2017-11-19 DIAGNOSIS — E7800 Pure hypercholesterolemia, unspecified: Secondary | ICD-10-CM | POA: Insufficient documentation

## 2017-11-19 DIAGNOSIS — Z87891 Personal history of nicotine dependence: Secondary | ICD-10-CM | POA: Insufficient documentation

## 2017-11-19 HISTORY — DX: Patent foramen ovale: Q21.12

## 2017-11-19 HISTORY — DX: Atrial septal defect: Q21.1

## 2017-11-19 HISTORY — PX: PATENT FORAMEN OVALE(PFO) CLOSURE: CATH118300

## 2017-11-19 LAB — POCT ACTIVATED CLOTTING TIME
ACTIVATED CLOTTING TIME: 180 s
ACTIVATED CLOTTING TIME: 224 s
Activated Clotting Time: 219 seconds

## 2017-11-19 LAB — ECHOCARDIOGRAM LIMITED
HEIGHTINCHES: 72.5 in
Weight: 3520 oz

## 2017-11-19 SURGERY — PATENT FORAMEN OVALE (PFO) CLOSURE
Anesthesia: LOCAL

## 2017-11-19 MED ORDER — ASPIRIN 81 MG PO CHEW
CHEWABLE_TABLET | ORAL | Status: AC
  Administered 2017-11-19: 81 mg via ORAL
  Filled 2017-11-19: qty 1

## 2017-11-19 MED ORDER — SODIUM CHLORIDE 0.9% FLUSH: 3.0000 mL | Freq: Two times a day (BID) | INTRAVENOUS | Status: DC

## 2017-11-19 MED ORDER — SODIUM CHLORIDE 0.9% FLUSH: 3.0000 mL | INTRAVENOUS | Status: DC | PRN

## 2017-11-19 MED ORDER — VANCOMYCIN HCL IN DEXTROSE 1-5 GM/200ML-% IV SOLN
INTRAVENOUS | Status: AC
  Filled 2017-11-19: qty 200

## 2017-11-19 MED ORDER — DIAZEPAM 5 MG PO TABS: 5.0000 mg | ORAL_TABLET | Freq: Four times a day (QID) | ORAL | Status: DC | PRN

## 2017-11-19 MED ORDER — VANCOMYCIN HCL IN DEXTROSE 1-5 GM/200ML-% IV SOLN
1000.0000 mg | INTRAVENOUS | Status: AC
  Administered 2017-11-19: 1000 mg via INTRAVENOUS

## 2017-11-19 MED ORDER — SODIUM CHLORIDE 0.9 % IV SOLN: 250.0000 mL | INTRAVENOUS | Status: DC | PRN

## 2017-11-19 MED ORDER — SODIUM CHLORIDE 0.9 % WEIGHT BASED INFUSION
3.0000 mL/kg/h | INTRAVENOUS | Status: AC
  Administered 2017-11-19: 3 mL/kg/h via INTRAVENOUS

## 2017-11-19 MED ORDER — LIDOCAINE HCL (PF) 1 % IJ SOLN
INTRAMUSCULAR | Status: AC
  Filled 2017-11-19: qty 30

## 2017-11-19 MED ORDER — SODIUM CHLORIDE 0.9 % WEIGHT BASED INFUSION: 1.0000 mL/kg/h | INTRAVENOUS | Status: DC

## 2017-11-19 MED ORDER — MIDAZOLAM HCL 2 MG/2ML IJ SOLN
INTRAMUSCULAR | Status: AC
  Filled 2017-11-19: qty 2

## 2017-11-19 MED ORDER — FENTANYL CITRATE (PF) 100 MCG/2ML IJ SOLN
INTRAMUSCULAR | Status: DC | PRN
  Administered 2017-11-19: 50 ug via INTRAVENOUS
  Administered 2017-11-19 (×2): 25 ug via INTRAVENOUS

## 2017-11-19 MED ORDER — ASPIRIN 81 MG PO CHEW
81.0000 mg | CHEWABLE_TABLET | ORAL | Status: AC
  Administered 2017-11-19: 81 mg via ORAL

## 2017-11-19 MED ORDER — LIDOCAINE HCL (PF) 1 % IJ SOLN
INTRAMUSCULAR | Status: DC | PRN
  Administered 2017-11-19: 15 mL

## 2017-11-19 MED ORDER — ACETAMINOPHEN 325 MG PO TABS: 650.0000 mg | ORAL_TABLET | ORAL | Status: DC | PRN

## 2017-11-19 MED ORDER — CLOPIDOGREL BISULFATE 75 MG PO TABS
75.0000 mg | ORAL_TABLET | Freq: Once | ORAL | Status: AC
  Administered 2017-11-19: 75 mg via ORAL

## 2017-11-19 MED ORDER — HEPARIN SODIUM (PORCINE) 1000 UNIT/ML IJ SOLN
INTRAMUSCULAR | Status: DC | PRN
  Administered 2017-11-19: 3000 [IU] via INTRAVENOUS
  Administered 2017-11-19: 7000 [IU] via INTRAVENOUS

## 2017-11-19 MED ORDER — HEPARIN (PORCINE) IN NACL 1000-0.9 UT/500ML-% IV SOLN
INTRAVENOUS | Status: AC
  Filled 2017-11-19: qty 500

## 2017-11-19 MED ORDER — ONDANSETRON HCL 4 MG/2ML IJ SOLN: 4.0000 mg | Freq: Four times a day (QID) | INTRAMUSCULAR | Status: DC | PRN

## 2017-11-19 MED ORDER — MIDAZOLAM HCL 2 MG/2ML IJ SOLN
INTRAMUSCULAR | Status: DC | PRN
  Administered 2017-11-19 (×2): 1 mg via INTRAVENOUS
  Administered 2017-11-19: 2 mg via INTRAVENOUS

## 2017-11-19 MED ORDER — FENTANYL CITRATE (PF) 100 MCG/2ML IJ SOLN
INTRAMUSCULAR | Status: AC
  Filled 2017-11-19: qty 2

## 2017-11-19 MED ORDER — CLOPIDOGREL BISULFATE 75 MG PO TABS
ORAL_TABLET | ORAL | Status: AC
  Administered 2017-11-19: 75 mg via ORAL
  Filled 2017-11-19: qty 1

## 2017-11-19 MED ORDER — HEPARIN SODIUM (PORCINE) 1000 UNIT/ML IJ SOLN
INTRAMUSCULAR | Status: AC
  Filled 2017-11-19: qty 1

## 2017-11-19 MED ORDER — HEPARIN (PORCINE) IN NACL 2-0.9 UNITS/ML
INTRAMUSCULAR | Status: AC | PRN
  Administered 2017-11-19 (×2): 500 mL

## 2017-11-19 SURGICAL SUPPLY — 16 items
CATH ACUNAV 8FR 90CM (CATHETERS) ×2 IMPLANT
CATH SUPER TORQUE PLUS 6F MPA1 (CATHETERS) ×2 IMPLANT
COVER PRB 48X5XTLSCP FOLD TPE (BAG) ×1 IMPLANT
COVER PROBE 5X48 (BAG) ×1
COVER SWIFTLINK CONNECTOR (BAG) ×2 IMPLANT
DEVICE SECURE STATLOCK IABP (MISCELLANEOUS) ×4 IMPLANT
GUIDEWIRE AMPLATZER 1.5JX260 (WIRE) ×2 IMPLANT
GUIDEWIRE ANGLED .035X150CM (WIRE) ×2 IMPLANT
OCCLUDER AMPLATZER PFO 25MM (Prosthesis & Implant Heart) ×2 IMPLANT
PACK CARDIAC CATHETERIZATION (CUSTOM PROCEDURE TRAY) ×2 IMPLANT
PROTECTION STATION PRESSURIZED (MISCELLANEOUS) ×2
SHEATH AVANTI 11CM 8FR (SHEATH) ×2 IMPLANT
SHEATH INTROD W/O MIN 9FR 25CM (SHEATH) ×2 IMPLANT
STATION PROTECTION PRESSURIZED (MISCELLANEOUS) ×1 IMPLANT
SYSTEM DELIVERY AMPLATZER 8FR (SHEATH) ×2 IMPLANT
WIRE EMERALD 3MM-J .035X150CM (WIRE) ×2 IMPLANT

## 2017-11-19 NOTE — Discharge Instructions (Signed)
Groin Site Care Refer to this sheet in the next few weeks. These instructions provide you with information on caring for yourself after your procedure. Your caregiver may also give you more specific instructions. Your treatment has been planned according to current medical practices, but problems sometimes occur. Call your caregiver if you have any problems or questions after your procedure. HOME CARE INSTRUCTIONS     : bleeding= lay flat hold pressure for 20 minutes, repeat if necessary, call 911 if continues   You may shower 24 hours after the procedure. Remove the bandage (dressing) and gently wash the site with plain soap and water. Gently pat the site dry.   Do not apply powder or lotion to the site.   Do not sit in a bathtub, swimming pool, or whirlpool for 5 to 7 days.   No bending, squatting, or lifting anything over 10 pounds (4.5 kg) as directed by your caregiver.   Inspect the site at least twice daily.   Do not drive home if you are discharged the same day of the procedure. Have someone else drive you.   You may drive 24 hours after the procedure unless otherwise instructed by your caregiver.  What to expect:  Any bruising will usually fade within 1 to 2 weeks.   Blood that collects in the tissue (hematoma) may be painful to the touch. It should usually decrease in size and tenderness within 1 to 2 weeks.  SEEK IMMEDIATE MEDICAL CARE IF:  You have unusual pain at the groin site or down the affected leg.   You have redness, warmth, swelling, or pain at the groin site.   You have drainage (other than a small amount of blood on the dressing).   You have chills.   You have a fever or persistent symptoms for more than 72 hours.   You have a fever and your symptoms suddenly get worse.   Your leg becomes pale, cool, tingly, or numb.  You have heavy bleeding from the site. Hold pressure on the site. Marland Kitchen

## 2017-11-19 NOTE — Interval H&P Note (Signed)
History and Physical Interval Note:  11/19/2017 8:33 AM  Steven Herrera  has presented today for surgery, with the diagnosis of pfo  The various methods of treatment have been discussed with the patient and family. After consideration of risks, benefits and other options for treatment, the patient has consented to  Procedure(s): PATENT FORAMEN OVALE (PFO) CLOSURE (N/A) as a surgical intervention .  The patient's history has been reviewed, patient examined, no change in status, stable for surgery.  I have reviewed the patient's chart and labs.  Questions were answered to the patient's satisfaction.     [name]

## 2017-11-19 NOTE — Progress Notes (Addendum)
Site area: RFV x 2 Site Prior to Removal:  Level  Pressure Applied For:30 min Manual:   yes Patient Status During Pull:  stable Post Pull Site:  Level 0 Post Pull Instructions Given:  yes Post Pull Pulses Present: palpable Dressing Applied:  clear Bedrest begins @ 1115 till 1515 Comments:

## 2017-11-19 NOTE — Progress Notes (Signed)
  Echocardiogram 2D Echocardiogram limited S/P PFO closure has been performed.  Darlina Sicilian M 11/19/2017, 1:17 PM

## 2017-11-20 ENCOUNTER — Encounter (HOSPITAL_COMMUNITY): Payer: Self-pay | Admitting: Cardiovascular Disease

## 2017-11-20 MED FILL — Heparin Sod (Porcine)-NaCl IV Soln 1000 Unit/500ML-0.9%: INTRAVENOUS | Qty: 1000 | Status: AC

## 2017-11-21 MED FILL — clonazePAM 1 MG TABS: 1 | 30 days supply | Qty: 60 | Fill #0

## 2017-11-27 ENCOUNTER — Ambulatory Visit (INDEPENDENT_AMBULATORY_CARE_PROVIDER_SITE_OTHER): Payer: 59 | Admitting: *Deleted

## 2017-11-27 DIAGNOSIS — I639 Cerebral infarction, unspecified: Secondary | ICD-10-CM

## 2017-11-28 NOTE — Progress Notes (Signed)
Carelink Summary Report / Loop Recorder 

## 2017-12-01 DIAGNOSIS — N486 Induration penis plastica: Secondary | ICD-10-CM | POA: Diagnosis not present

## 2017-12-02 DIAGNOSIS — N486 Induration penis plastica: Secondary | ICD-10-CM | POA: Diagnosis not present

## 2017-12-15 NOTE — Progress Notes (Signed)
[facility] [place]                                       Cardiology Office Note    Date:  12/17/2017   ID:  LEAF KERNODLE, DOB 1970/09/10, MRN [id number]  PCP:  [name], NP  Cardiologist:  Dr. Burt Knack  CC: 1 month s/p PFO closure   History of Present Illness:  Steven Herrera is a 47 y.o. male with a history of HLD, cryptogenic CVA and PFO with atrial septal aneurysm s/p PFO closure (11/19/17) who presents to clinic for 1 month follow up.   He presented in January 2019 with clumsiness and confusion.  He was noted to have an acute/subacute infarct involving the left lentiform nucleus, left internal capsule, and left caudate head.  His symptoms improved and he was not left with any specific residual deficits. His evaluation included a hypercoagulability panel which was negative, he had no evidence of large vessel occlusive disease or heavy aortic atherosclerosis.  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  A transcranial Doppler study demonstrated a moderate right to left shunt at the intracardiac level.   He was seen by Dr Burt Knack who felt he was a suitable candidate for PFO closure which was set up for 11/19/17. He underwent successful transcatheter PFO closure using a 25 mm Amplatzer occluder device. Follow up limited echo showed successful closure of PFO with no color doppler evidence of residual PFO. He was discharged on ASA and plavix x3 months.   Today he presents to clinic for follow up. No CP or SOB. No LE edema, orthopnea or PND. No dizziness or syncope. No blood in stool or urine. No palpitations. No new neurologic symptoms. 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    Past Medical History:  Diagnosis Date  . High cholesterol   . PFO (patent foramen ovale)    a. s/p PFO  closure by Dr. Burt Knack on 11/19/17  . Stroke [facility])     Past Surgical History:  Procedure Laterality Date  . CARPAL TUNNEL RELEASE    . LOOP RECORDER INSERTION N/A 09/25/2017   Procedure: LOOP RECORDER INSERTION;  Surgeon:  Grayer, MD;  Location: [place] [facility];  Service: Cardiovascular;  Laterality: N/A;  . MOUTH SURGERY    . PATENT FORAMEN OVALE(PFO) CLOSURE N/A 11/19/2017   Procedure: PATENT FORAMEN OVALE (PFO) CLOSURE;  Surgeon: [name], MD;  Location: [place] [facility];  Service: Cardiovascular;  Laterality: N/A;  . spider bite    . TEE WITHOUT CARDIOVERSION N/A 09/25/2017   Procedure: TRANSESOPHAGEAL ECHOCARDIOGRAM (TEE) WITH LOOP;  Surgeon: Pixie Casino, MD;  Location: [facility];  Service: Cardiovascular;  Laterality: N/A;  . VASECTOMY      Current Medications: Outpatient Medications Prior to Visit  Medication Sig Dispense Refill  . aspirin EC 81 MG tablet Take 81 mg by mouth daily.    Marland Kitchen atorvastatin (LIPITOR) 80 MG tablet TAKE 1 TABLET BY MOUTH ONCE DAILY AT 6 PM (Patient taking differently: TAKE 1 TABLET BY MOUTH ONCE DAILY AT LUNCH TIME) 90 tablet 0  . buPROPion (WELLBUTRIN XL) 150 MG 24 hr tablet One tablet by mouth daily (Patient taking differently: Take 150 mg by  mouth daily. ) 90 tablet 0  . clonazePAM (KLONOPIN) 1 MG tablet TAKE 1 TABLET BY MOUTH TWICE A DAY 60 tablet 0  . clopidogrel (PLAVIX) 75 MG tablet Take 1 tablet (75 mg total) by mouth daily. 90 tablet 3  . Menthol, Topical Analgesic, (ICY HOT EX) Apply 1 application topically daily as needed (pain).    . Multiple Vitamin (ONE-A-DAY MENS PO) Take 1 tablet by mouth daily.    Marland Kitchen omega-3 acid ethyl esters (LOVAZA) 1 g capsule Take 2 capsules (2 g total) by mouth 2 (two) times daily. 240 capsule 2  . PARoxetine (PAXIL) 40 MG tablet Take 1 tablet (40 mg total) by mouth daily. 90 tablet 0   No facility-administered medications prior to visit.      Allergies:   Ibuprofen and Penicillins   Social  History   Socioeconomic History  . Marital status: Married    Spouse name: Not on file  . Number of children: Not on file  . Years of education: Not on file  . Highest education level: Not on file  Occupational History  . Not on file  Social Needs  . Financial resource strain: Not on file  . Food insecurity:    Worry: Not on file    Inability: Not on file  . Transportation needs:    Medical: Not on file    Non-medical: Not on file  Tobacco Use  . Smoking status: Former Smoker    Packs/day: 0.25    Years: 28.00    Pack years: 7.00    Types: Cigarettes    Last attempt to quit: 07/25/2017    Years since quitting: 0.3  . Smokeless tobacco: Never Used  Substance and Sexual Activity  . Alcohol use: Yes    Alcohol/week: 0.6 oz    Types: 1 Shots of liquor per week    Comment: 0.5 pints nightly, daily  . Drug use: Yes    Types: Marijuana    Comment: takes daily for pain leisure   . Sexual activity: Yes    Birth control/protection: Surgical  Lifestyle  . Physical activity:    Days per week: Not on file    Minutes per session: Not on file  . Stress: Not on file  Relationships  . Social connections:    Talks on phone: Not on file    Gets together: Not on file    Attends religious service: Not on file    Active member of club or organization: Not on file    Attends meetings of clubs or organizations: Not on file    Relationship status: Not on file  Other Topics Concern  . Not on file  Social History Narrative  . Not on file     Family History:  The patient's family history includes Alcohol abuse in his father; Atrial fibrillation in his mother; Dementia in his mother; Drug abuse in his sister; Healthy in his sister; Heart disease in his mother; Hypertension in his brother and father; Stroke in his father.      ROS:   Please see the history of present illness.    ROS All other systems reviewed and are negative.   PHYSICAL EXAM:   VS:  BP 110/80   Pulse 85   Ht 6'  0.5" (1.842 m)   Wt 210 lb (95.3 kg)   SpO2 97%   BMI 28.09 kg/m    GEN: Well nourished, well developed, in no acute distress, many tattoos  HEENT: normal  Neck: no JVD, carotid bruits, or masses Cardiac: RRR; no murmurs, rubs, or gallops,no edema  Respiratory:  clear to auscultation bilaterally, normal work of breathing GI: soft, nontender, nondistended, + BS MS: no deformity or atrophy  Skin: warm and dry, no rash Neuro:  Alert and Oriented x 3, Strength and sensation are intact Psych: euthymic mood, full affect    Wt Readings from Last 3 Encounters:  12/17/17 210 lb (95.3 kg)  11/19/17 220 lb (99.8 kg)  11/06/17 215 lb (97.5 kg)      Studies/Labs Reviewed:   EKG:  EKG is NOT ordered today.    Recent Labs: 08/25/2017: ALT 37; TSH 1.830 11/06/2017: BUN 15; Creatinine, Ser 1.07; Hemoglobin 14.6; Platelets 244; Potassium 4.6; Sodium 139   Lipid Panel    Component Value Date/Time   CHOL 168 10/27/2017 1102   TRIG 1,262 (HH) 10/27/2017 1102   HDL 27 (L) 10/27/2017 1102   CHOLHDL 6.2 (H) 10/27/2017 1102   CHOLHDL 8.2 06/27/2017 0843   VLDL UNABLE TO CALCULATE IF TRIGLYCERIDE OVER 400 mg/dL 06/27/2017 0843   [place] Comment 10/27/2017 1102    Additional studies/ records that were reviewed today include:  11/19/17 Conclusion Successful transcatheter PFO closure using a 25 mm Amplatzer PFO occluder device  Recommend:  Limited echo prior to discharge today  ASA/Clopidogrel x 3 months, then ASA alone  SBE prophylaxis when indicated x 6 months    11/19/17 Study Conclusions - HPI and indications: Limited study for PFO closure. - Left ventricle: Wall thickness was increased in a pattern of mild   LVH. Systolic function was normal. The estimated ejection   fraction was in the range of 60% to 65%. Wall motion was normal;   there were no regional wall motion abnormalities. - Atrial septum: Well-seated Amplatzer device noted on the atrial   septum. No PFO noted by color  doppler. Saline microbubble   contrast not performed. Impressions: - Successful closure a PFO with Amplatzer occluder. No color  doppler evidence of residual PFO.    ASSESSMENT & PLAN:   PFO with atrial septal aneurysm: s/p successful PFO closure on 11/19/17. He is on ASA and plavix. He can stop plavix after 3 months and continue on Aspirin indefinitely. I will see him back in 1 year with a limited echo with bubble study. I have Rx'ed clindamycin for SBE prophylaxis. He can discontinue this after 6 months.    HLD: continue statin   Cryptogenic CVA: continue ASA and statin.    Medication Adjustments/Labs and Tests Ordered: Current medicines are reviewed at length with the patient today.  Concerns regarding medicines are outlined above.  Medication changes, Labs and Tests ordered today are listed in the Patient Instructions below. Patient Instructions  Medication Instructions:  Your physician has recommended you make the following change in your medication:  1-TAKE Clindamycin 600 mg (2 tablets) by mouth one hour prior to any dental work until December 2019. 2-You can stop your Plavix in 2 months around the beginning of September.   Labwork: NONE ORDERED TODAY  Testing/Procedures: Your physician has requested that you have an echocardiogram in May 2020. Echocardiography is a painless test that uses sound waves to create images of your heart. It provides your doctor with information about the size and shape of your heart and how well your heart's chambers and valves are working. This procedure takes approximately one hour. There are no restrictions for this procedure.  Follow-Up: Your physician wants you to follow-up in: May 2020 with  Nell Range PA. You will receive a reminder letter in the mail two months in advance. If you don't receive a letter, please call our office to schedule the follow-up appointment.   If you need a refill on your cardiac medications before your next  appointment, please call your pharmacy.      Signed, Angelena Form, PA-C  12/17/2017 2:18 PM    [facility] [address], [place], [place]  [address] Phone: [phone]; Fax: [phone]

## 2017-12-17 ENCOUNTER — Ambulatory Visit (INDEPENDENT_AMBULATORY_CARE_PROVIDER_SITE_OTHER): Payer: 59 | Admitting: Physician Assistant

## 2017-12-17 ENCOUNTER — Encounter: Payer: Self-pay | Admitting: Physician Assistant

## 2017-12-17 VITALS — BP 110/80 | HR 85 | Ht 72.5 in | Wt 210.0 lb

## 2017-12-17 DIAGNOSIS — E785 Hyperlipidemia, unspecified: Secondary | ICD-10-CM | POA: Diagnosis not present

## 2017-12-17 DIAGNOSIS — Q2112 Patent foramen ovale: Secondary | ICD-10-CM

## 2017-12-17 DIAGNOSIS — Z8673 Personal history of transient ischemic attack (TIA), and cerebral infarction without residual deficits: Secondary | ICD-10-CM

## 2017-12-17 DIAGNOSIS — Q211 Atrial septal defect: Secondary | ICD-10-CM | POA: Diagnosis not present

## 2017-12-17 MED ORDER — CLINDAMYCIN HCL 300 MG PO CAPS: ORAL_CAPSULE | ORAL | 0 refills | Status: DC

## 2017-12-17 NOTE — Patient Instructions (Signed)
Medication Instructions:  Your physician has recommended you make the following change in your medication:  1-TAKE Clindamycin 600 mg (2 tablets) by mouth one hour prior to any dental work until December 2019. 2-You can stop your Plavix in 2 months around the beginning of September.   Labwork: NONE ORDERED TODAY  Testing/Procedures: Your physician has requested that you have an echocardiogram in May 2020. Echocardiography is a painless test that uses sound waves to create images of your heart. It provides your doctor with information about the size and shape of your heart and how well your heart's chambers and valves are working. This procedure takes approximately one hour. There are no restrictions for this procedure.  Follow-Up: Your physician wants you to follow-up in: May 2020 with Nell Range PA. You will receive a reminder letter in the mail two months in advance. If you don't receive a letter, please call our office to schedule the follow-up appointment.   If you need a refill on your cardiac medications before your next appointment, please call your pharmacy.

## 2017-12-26 ENCOUNTER — Ambulatory Visit: Payer: 59 | Admitting: *Deleted

## 2017-12-26 DIAGNOSIS — I639 Cerebral infarction, unspecified: Secondary | ICD-10-CM

## 2017-12-29 MED FILL — CLINDAMYCIN HCL 300 MG CAPS: 300 | 3 days supply | Qty: 6 | Fill #0

## 2017-12-30 ENCOUNTER — Other Ambulatory Visit: Payer: Self-pay | Admitting: Adult Health

## 2018-01-05 LAB — CUP PACEART REMOTE DEVICE CHECK
Date Time Interrogation Session: 20190606180655
Implantable Pulse Generator Implant Date: 20190404

## 2018-01-07 MED FILL — clonazePAM 1 MG TABS: 1 | 30 days supply | Qty: 60 | Fill #0

## 2018-01-14 ENCOUNTER — Ambulatory Visit: Payer: 59 | Admitting: Adult Health

## 2018-01-14 ENCOUNTER — Encounter: Payer: Self-pay | Admitting: Neurology

## 2018-01-14 NOTE — Progress Notes (Deleted)
[facility] [address] [place]. [place] [address] [phone]       OFFICE FOLLOW-UP NOTE  Steven Herrera Date of Birth:  June 24, 1971 Medical Record Number:  [id number]   HPI: Steven Herrera is a 47 year old Caucasian male seen today for the first office follow-up visit following hospital admission for stroke in January 2019.history is obtained from the patient and review of electronic medical records. I have personally reviewed imaging films.Steven Herrera an 47 y.o.malewith history of hypercholesterolemia, carpal tunnel,depression and anxiety. Patient presented to the emergency department on 3 January of 2018 for difficulty with hand eye coordination. She apparently took a nap on 25 June 2017 but upon waking he had some confusion, he noticed some difficulty using the cell phone as far as difficulty pushing the numbers and using it correctly. However this did make him nervous as his family has a history of strokes that she reported to the emergency department. Upon entering the patient's blood pressure was stable, chemistry panel and CBC were stable however CT of the head demonstrated a low-attenuation in the left basal ganglia which was suspicious for a possible acute ischemic infarct. This is followed up by an MRI of the brain which did not show any acute/subacute nonhemorrhagic infarct involving the leftlentiform nucleus and internal capsule along with a left caudate head. MRA was nonrevealing. Patient's LDL was unable to be calculated secondary to elevated triglycerides.  Date last known well:Date:06/25/2017 Time last known well:Time:10:00am tPA Given:No:Out of the window. Patient had been noncompliant with his medications and was smoking and drinking heavily. MRI scan of the brain showed left basal gangliaand subinsular large subcortical infarct. MRA of the brain showed no significant large vessel stenosis. Carotid ultrasound showed no  significant extracranial stenosis. Transthoracic echo showed normal ejection fraction. LDL could not be calculated as his triglycerides were quite high. Patient was not on aspirin. A single physical occupational therapy. He is very minimum deficits. He was discharged home and came back for an outpatient transesophageal echocardiogram which was done on 06/27/17 and showed a atrial septal aneurysm and patent foramen ovale. He subsequently also had outpatient loop recorder inserted on 09/25/17 by Steven Herrera insofar paroxysmal A. Fib has not yet been found. The patient does give a family history of multiple strokes in his father which began in the 12s. He denies any history of deep vein thrombosis, pulmonary embolism or migraines. 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 There is no family history of strokes at a young age.  01/14/18 UPDATE: Since previous visit, patient did undergo PFO closure on 11/19/2017.  Follow-up echo confirmed successful closure of PFO and was discharged with recommendation of aspirin and Plavix for 3 months and then aspirin alone.    ROS:   14 system review of systems is positive for  Joint and back pain, muscle cramps, walking difficulty, neck pain and stiffness and all other systems negative  PMH:  Past Medical History:  Diagnosis Date  . High cholesterol   . PFO (patent foramen ovale)    a. s/p PFO closure by Dr. Burt Herrera on 11/19/17  . Stroke [facility])     Social History:  Social History   Socioeconomic History  . Marital status: Married    Spouse name: Not on file  . Number of children: Not on file  . Years  of education: Not on file  . Highest education level: Not on file  Occupational History  . Not on file  Social Needs  . Financial resource strain: Not on file  . Food insecurity:    Worry: Not on file    Inability: Not on file  .  Transportation needs:    Medical: Not on file    Non-medical: Not on file  Tobacco Use  . Smoking status: Former Smoker    Packs/day: 0.25    Years: 28.00    Pack years: 7.00    Types: Cigarettes    Last attempt to quit: 07/25/2017    Years since quitting: 0.4  . Smokeless tobacco: Never Used  Substance and Sexual Activity  . Alcohol use: Yes    Alcohol/week: 0.6 oz    Types: 1 Shots of liquor per week    Comment: 0.5 pints nightly, daily  . Drug use: Yes    Types: Marijuana    Comment: takes daily for pain leisure   . Sexual activity: Yes    Birth control/protection: Surgical  Lifestyle  . Physical activity:    Days per week: Not on file    Minutes per session: Not on file  . Stress: Not on file  Relationships  . Social connections:    Talks on phone: Not on file    Gets together: Not on file    Attends religious service: Not on file    Active member of club or organization: Not on file    Attends meetings of clubs or organizations: Not on file    Relationship status: Not on file  . Intimate partner violence:    Fear of current or ex partner: Not on file    Emotionally abused: Not on file    Physically abused: Not on file    Forced sexual activity: Not on file  Other Topics Concern  . Not on file  Social History Narrative  . Not on file    Medications:   Current Outpatient Medications on File Prior to Visit  Medication Sig Dispense Refill  . aspirin EC 81 MG tablet Take 81 mg by mouth daily.    Marland Kitchen atorvastatin (LIPITOR) 80 MG tablet TAKE 1 TABLET BY MOUTH ONCE DAILY AT 6 PM (Patient taking differently: TAKE 1 TABLET BY MOUTH ONCE DAILY AT LUNCH TIME) 90 tablet 0  . buPROPion (WELLBUTRIN XL) 150 MG 24 hr tablet One tablet by mouth daily (Patient taking differently: Take 150 mg by mouth daily. ) 90 tablet 0  . clindamycin (CLEOCIN) 300 MG capsule Take 600 mg (2 tablets) by mouth one hour before any dental work. 6 capsule 0  . clonazePAM (KLONOPIN) 1 MG tablet TAKE 1  TABLET BY MOUTH TWICE DAILY 60 tablet 0  . clopidogrel (PLAVIX) 75 MG tablet Take 1 tablet (75 mg total) by mouth daily. 90 tablet 3  . Menthol, Topical Analgesic, (ICY HOT EX) Apply 1 application topically daily as needed (pain).    . Multiple Vitamin (ONE-A-DAY MENS PO) Take 1 tablet by mouth daily.    Marland Kitchen omega-3 acid ethyl esters (LOVAZA) 1 g capsule Take 2 capsules (2 g total) by mouth 2 (two) times daily. 240 capsule 2  . PARoxetine (PAXIL) 40 MG tablet Take 1 tablet (40 mg total) by mouth daily. 90 tablet 0   No current facility-administered medications on file prior to visit.     Allergies:   Allergies  Allergen Reactions  . Ibuprofen  Bloody stools  . Penicillins Hives    Has patient had a PCN reaction causing immediate rash, facial/tongue/throat swelling, SOB or lightheadedness with hypotension: No Has patient had a PCN reaction causing severe rash involving mucus membranes or skin necrosis: Yes Has patient had a PCN reaction that required hospitalization: no Has patient had a PCN reaction occurring within the last 10 years: no If all of the above answers are "NO", then may proceed with Cephalosporin use.    Physical Exam Herrera: well developed, well nourished heavily body tattooed young Caucasian male, seated, in no evident distress Head: head normocephalic and atraumatic.  Neck: supple with no carotid or supraclavicular bruits Cardiovascular: regular rate and rhythm, no murmurs Musculoskeletal: no deformity Skin:  no rash/petichiae multiple body tattoos all over Vascular:  Normal pulses all extremities There were no vitals filed for this visit. Neurologic Exam Mental Status: Awake and fully alert. Oriented to place and time. Recent and remote memory intact. Attention span, concentration and fund of knowledge appropriate. Mood and affect appropriate.  Cranial Nerves: Fundoscopic exam reveals sharp disc margins. Pupils equal, briskly reactive to light. Extraocular  movements full without nystagmus. Visual fields full to confrontation. Hearing intact. Facial sensation intact. Face, tongue, palate moves normally and symmetrically.  Motor: Normal bulk and tone. Normal strength in all tested extremity muscles. Sensory.: intact to touch ,pinprick .position and vibratory sensation.  Coordination: Rapid alternating movements normal in all extremities. Finger-to-nose and heel-to-shin performed accurately bilaterally. Gait and Station: Arises from chair without difficulty. Stance is normal. Gait demonstrates normal stride length and balance . Able to heel, toe and tandem walk without difficulty.  Reflexes: 1+ and symmetric. Toes downgoing.   NIHSS  0 Modified Rankin  1   ASSESSMENT: 47 year old Caucasian male with cryptogenic left basal ganglia infarct and January 2019 with vascular risk factors of tobacco abuse, hyperlipidemia, heavy alcohol intake and patent foramen ovale.    PLAN: -Continue {anticoagulants:31417}  and ***  for secondary stroke prevention -F/u with PCP regarding your *** management -continue to monitor BP at home  -Maintain strict control of hypertension with blood pressure goal below 130/90, diabetes with hemoglobin A1c goal below 6.5% and cholesterol with LDL cholesterol (bad cholesterol) goal below 70 mg/dL. I also advised the patient to eat a healthy diet with plenty of whole grains, cereals, fruits and vegetables, exercise regularly and maintain ideal body weight.  Follow up in *** or call earlier if needed   .Continue aspirin 81 mg daily  for secondary stroke  Check transcranial Doppler bubble study to categorize his PFO and hypercoagulable panel labs.he has a ROPE score of 7 which gives him a 70% chance that his stroke is due to PFO and he may benefit with endovascular PFO closure.I have advised him to see Dr. [name] for PFO closure..Followup in the future with my practitioner Janett Billow in 3 months or call earlier if  necessary   Greater than 50% of time during this 25 minute visit was spent on counseling,explanation of diagnosis of cryptogenic stroke and PFO, planning of further management, discussion with patient and family and coordination of care  Venancio Poisson, [facility]  [facility] [address] [place], [place] [address]  Phone [phone] Fax [phone] Note: This document was prepared with digital dictation and possible smart phrase technology. Any transcriptional errors that result from this process are unintentional.

## 2018-01-15 ENCOUNTER — Encounter: Payer: Self-pay | Admitting: Adult Health

## 2018-01-26 DIAGNOSIS — N5201 Erectile dysfunction due to arterial insufficiency: Secondary | ICD-10-CM | POA: Diagnosis not present

## 2018-01-26 DIAGNOSIS — N486 Induration penis plastica: Secondary | ICD-10-CM | POA: Diagnosis not present

## 2018-01-30 LAB — CUP PACEART REMOTE DEVICE CHECK
Date Time Interrogation Session: 20190709184037
Implantable Pulse Generator Implant Date: 20190404

## 2018-02-02 ENCOUNTER — Ambulatory Visit (INDEPENDENT_AMBULATORY_CARE_PROVIDER_SITE_OTHER): Payer: 59 | Admitting: *Deleted

## 2018-02-02 DIAGNOSIS — I639 Cerebral infarction, unspecified: Secondary | ICD-10-CM | POA: Diagnosis not present

## 2018-02-03 NOTE — Progress Notes (Signed)
Carelink Summary Report / Loop Recorder 

## 2018-02-05 ENCOUNTER — Other Ambulatory Visit: Payer: Self-pay | Admitting: Adult Health

## 2018-02-05 MED FILL — CLOPIDOGREL 75 MG TABLET: 75 | 90 days supply | Qty: 90 | Fill #1

## 2018-02-05 MED FILL — OMEGA-3 ETHYL ESTERS 1 GM C: 1 | 60 days supply | Qty: 240 | Fill #1

## 2018-02-05 MED FILL — ATORVASTATIN 80 MG TABLET: 80 | 90 days supply | Qty: 90 | Fill #0

## 2018-02-05 NOTE — Telephone Encounter (Signed)
Pt has appt 02/09/18.  Advised pt that Valetta Fuller will address at [place].  Charyl Bigger, CMA

## 2018-02-08 ENCOUNTER — Encounter: Payer: Self-pay | Admitting: Adult Health

## 2018-02-08 NOTE — Progress Notes (Signed)
Subjective:    Patient ID: Steven Herrera, male    DOB: 1970/10/25, 47 y.o.   MRN: [id number]  HPI: 07/11/17 OV:  Steven Herrera is here to establish as a new pt.  He is a pleasant 47 year old male.  DGL:OVFIEPP/IRJJOACZYS, HL and he suffered Acute Ischemic Stroke 06/26/17, hospitalized until 06/28/17- he was instructed to establish with PCP in 1 week and f/u with Neuro- he has appt 08/18/17 with Dr. [name] During hospitalization: noncontrast head CT demonstrates low-attenuation changes in the left basal ganglia and subinsular region with suspicion for acute ischemic CVA.  Carotid US: Both vertebral arteries were patent with antegrade flow. Subclavians: Normal flow hemodynamics were seen in bilateral subclavian arteries.  Echo: EF 50-55%,  He was previously using [facility] for primary healthcare needs, has not had CPE in years. He drinks 40-50 oz water/day, diet "not the greatest", and does not exercise regularly.  He has been reducing tobacco use- 1-2 "black n milds"/day. He drinks 1/2 pint of liquor/day He uses marijuana daily, estimates 1/2 oz per month He denies substance use has caused issues with work (he owns a tattoo parlor)  08/25/17 OV: Steven Herrera presents for CPE. He is compliant on all medications and denies SE. His last tobacco use was 07/25/17- GREAT! He was seen by Neurology 08/18/17- advised to keep BP <130/90, A1c <6.5%, LDL<70 He was referred to cards for TEE and lop recorder by Neuro, however he was confused about referral. He has dramatically reduced ETOH use, completley stopped tobacco use (since 07/25/17), reduced saturated fat/CHO/intake, however continues to use marijuana nightly. Overall he feels "pretty good" and denies acute complaints today.  02/09/18 OV: Steven Herrera is here for f/u: HLD, Anxiety/Depression 11/19/17- PFO Closure 1 month f/u at [facility] 12/17/17- "He is on ASA and plavix. He can stop plavix after 3 months and continue on Aspirin indefinitely. I will  see him back in 1 year with a limited echo with bubble study. I have Rx'ed clindamycin for SBE prophylaxis. He can discontinue this after 6 months" He missed July 2019 Neurology appt due to vehicle break down, he plans on scheduling f/u next week. He estimates to drink 6 "Bud Lights" nightly and will have one liquor drink Fri/Sat/Sun in addition to beer. He was drinking > 1/2 gallon of liquor/week winter/spring 2019 He denies hx of DUIs, however has had ETOH interfere with interpersonal relationships. He continues to abstain from tobacco use, however is still smoking marijuana nightly. He reports mood is stable, denies thoughts of harming himself/others He reports taking a friends ADHD medication for 10 days recently and experienced as dramatic improvement in attention/focus/concentration.  We discussed the dangers of taking someone else's rx's. He has never been evaluated by psychiatry.  Patient Care Team    Relationship Specialty Notifications Start End  Mina Marble D, NP PCP - General Family Medicine  07/11/17   Garvin Fila, MD Consulting Physician Neurology  07/11/17   Freddie Breech [facility]    08/25/17     Patient Active Problem List   Diagnosis Date Noted  . High cholesterol 02/09/2018  . 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 On statin therapy 08/25/2017  . Healthcare maintenance 07/11/2017  . Depression with anxiety 06/27/2017  . Cerebrovascular accident (CVA) ([place]) 06/27/2017  . Acute ischemic stroke ([place]) 06/26/2017  . Elevated blood-pressure reading without diagnosis of hypertension 06/26/2017  . NEVUS 02/13/2007     Past Medical History:  Diagnosis Date  .  High cholesterol   . PFO (patent foramen ovale)    a. s/p PFO closure by Dr. Burt Knack on 11/19/17  . Stroke [facility])      Past Surgical History:  Procedure Laterality Date  . CARPAL TUNNEL RELEASE    . LOOP RECORDER INSERTION N/A 09/25/2017   Procedure: LOOP RECORDER INSERTION;  Surgeon: [name], MD;  Location: [place] [facility];  Service: Cardiovascular;  Laterality: N/A;  . MOUTH SURGERY    . PATENT FORAMEN OVALE(PFO) CLOSURE N/A 11/19/2017   Procedure: PATENT FORAMEN OVALE (PFO) CLOSURE;  Surgeon: [name], MD;  Location: [place] [facility];  Service: Cardiovascular;  Laterality: N/A;  . spider bite    . TEE WITHOUT CARDIOVERSION N/A 09/25/2017   Procedure: TRANSESOPHAGEAL ECHOCARDIOGRAM (TEE) WITH LOOP;  Surgeon: Pixie Casino, MD;  Location: [facility] ENDOSCOPY;  Service: Cardiovascular;  Laterality: N/A;  . VASECTOMY       Family History  Problem Relation Age of Onset  . Stroke Father        disabling CVA in early-mid 50's  . Hypertension Father   . Alcohol abuse Father   . Heart disease Mother   . Dementia Mother   . Atrial fibrillation Mother   . Drug abuse Sister   . Hypertension Brother   . Healthy Sister      Social History   Substance and Sexual Activity  Drug Use Yes  . Types: Marijuana   Comment: takes daily for pain leisure      Social History   Substance and Sexual Activity  Alcohol Use Yes  . Alcohol/week: 1.0 standard drinks  . Types: 1 Shots of liquor per week   Comment: 0.5 pints nightly, daily     Social History   Tobacco Use  Smoking Status Former Smoker  . Packs/day: 0.25  . Years: 28.00  . Pack years: 7.00  . Types: Cigarettes  . Last attempt to quit: 07/25/2017  . Years since quitting: 0.5  Smokeless Tobacco Never Used     Outpatient Encounter Medications as of 02/09/2018  Medication Sig  . aspirin EC 81 MG tablet Take 81 mg by mouth daily.  Marland Kitchen atorvastatin (LIPITOR) 80 MG tablet TAKE 1 TABLET BY MOUTH ONCE DAILY AT 6 PM (Patient taking differently: TAKE 1 TABLET BY MOUTH ONCE DAILY AT LUNCH TIME)  . buPROPion (WELLBUTRIN XL) 150 MG 24 hr tablet TAKE 1 TABLET BY MOUTH ONCE DAILY  . clindamycin (CLEOCIN) 300 MG capsule Take 600 mg (2 tablets) by mouth one hour before any dental work.  . clonazePAM (KLONOPIN) 1 MG  tablet TAKE 1 TABLET BY MOUTH TWICE DAILY. No additional refills will be allowed.  . clopidogrel (PLAVIX) 75 MG tablet Take 1 tablet (75 mg total) by mouth daily.  . Menthol, Topical Analgesic, (ICY HOT EX) Apply 1 application topically daily as needed (pain).  . Multiple Vitamin (ONE-A-DAY MENS PO) Take 1 tablet by mouth daily.  Marland Kitchen omega-3 acid ethyl esters (LOVAZA) 1 g capsule Take 2 capsules (2 g total) by mouth 2 (two) times daily.  Marland Kitchen PARoxetine (PAXIL) 40 MG tablet TAKE 1 TABLET BY MOUTH ONCE DAILY  . [DISCONTINUED] clonazePAM (KLONOPIN) 1 MG tablet TAKE 1 TABLET BY MOUTH TWICE DAILY  . [DISCONTINUED] buPROPion (WELLBUTRIN XL) 150 MG 24 hr tablet One tablet by mouth daily (Patient taking differently: Take 150 mg by mouth daily. )  . [DISCONTINUED] PARoxetine (PAXIL) 40 MG tablet Take 1 tablet (40 mg total) by mouth daily.  No facility-administered encounter medications on file as of 02/09/2018.     Allergies: Ibuprofen and Penicillins  Body mass index is 28.95 kg/m.  Blood pressure 105/67, pulse 75, height 6' 0.5" (1.842 m), weight 216 lb 6.4 oz (98.2 kg), SpO2 99 %.  Review of Systems  Constitutional: Positive for fatigue. Negative for activity change, appetite change, chills, diaphoresis, fever and unexpected weight change.  HENT: Negative for congestion.   Eyes: Negative for visual disturbance.  Respiratory: Negative for cough, chest tightness, shortness of breath, wheezing and stridor.   Cardiovascular: Negative for chest pain, palpitations and leg swelling.  Gastrointestinal: Negative for abdominal distention, abdominal pain, anal bleeding, constipation, diarrhea, nausea and vomiting.  Endocrine: Negative for cold intolerance, heat intolerance, polydipsia, polyphagia and polyuria.  Genitourinary: Negative for difficulty urinating.  Neurological: Negative for dizziness and headaches.  Hematological: Bruises/bleeds easily.  Psychiatric/Behavioral: Positive for dysphoric mood  and sleep disturbance. Negative for confusion, self-injury and suicidal ideas. The patient is nervous/anxious. The patient is not hyperactive.        Objective:   Physical Exam  Constitutional: He is oriented to person, place, and time. He appears well-developed and well-nourished. No distress.  HENT:  Head: Normocephalic and atraumatic.  Right Ear: External ear normal.  Left Ear: External ear normal.  Eyes: Pupils are equal, round, and reactive to light. Conjunctivae are normal.  Cardiovascular: Normal rate, regular rhythm, normal heart sounds and intact distal pulses.  No murmur heard. Pulmonary/Chest: Effort normal and breath sounds normal. No respiratory distress. He has no wheezes. He has no rales. He exhibits no tenderness.  Neurological: He is alert and oriented to person, place, and time.  Skin: Skin is warm and dry. No rash noted. He is not diaphoretic. No erythema. No pallor.  Psychiatric: He has a normal mood and affect. His behavior is normal. Judgment and thought content normal.  Nursing note and vitals reviewed.         Assessment & Plan:   1. Depression with anxiety   2. On statin therapy   3. Healthcare maintenance   4. High cholesterol   5. PFO with atrial septal aneurysm     Depression with anxiety [place] Controlled Substance Database reviewed- spoke with pharmacy and he did not receive 12/31/17 clonazepam 1mg  BID PRN, 60 count refill. He did receive 01/07/18 clonazepam 1mg  BID PRN,  60 count refill. Discussed at length the benefits of safely weaning off clonazepam and continuing to reduce ETOH use Referral to psychiatry placed, future anxiolytic rx to be filled with that practice  Healthcare maintenance Continue all medications as directed. Per Cardiology, discontinue Clopidogrel (Plavix) Sept 2019- continue Aspirin indefinitely. Referral placed for psychiatrist, re: anxiety, depression, and evaluation of ADHD. Final refill on Paroxetine,  Wellbutrin, and Clonazepam provided- please request future refills from psychiatrist. Increase water intake, strive for at least 65 ounces/day.   Follow Mediterranean diet. Continue to reduce alcohol use- you can do it! Increase regular exercise.  Recommend at least 30 minutes daily, 5 days per week of walking, jogging, biking, swimming, YouTube/Pinterest workout videos. We will call you when lab results are available. Complete physical with fasting labs in 6 months.  On statin therapy CMP drawn today  High cholesterol Lipid panel drawn today Currently taking Atorvastatin 80mg  QD He continues to reduce saturated fat intake and has been slowly increasing regular movement.  PFO with atrial septal aneurysm Successful closure 11/19/17 Per cards stop clopidogrel 02/2018, remain on ASA 81 mg QD indefinitely  FOLLOW-UP:  Return in about 6 months (around 08/12/2018) for CPE, Fasting Labs.

## 2018-02-09 ENCOUNTER — Other Ambulatory Visit: Payer: Self-pay | Admitting: Adult Health

## 2018-02-09 ENCOUNTER — Telehealth: Payer: Self-pay

## 2018-02-09 ENCOUNTER — Ambulatory Visit (INDEPENDENT_AMBULATORY_CARE_PROVIDER_SITE_OTHER): Payer: 59 | Admitting: Adult Health

## 2018-02-09 ENCOUNTER — Encounter: Payer: Self-pay | Admitting: Adult Health

## 2018-02-09 VITALS — BP 105/67 | HR 75 | Ht 72.5 in | Wt 216.4 lb

## 2018-02-09 DIAGNOSIS — F418 Other specified anxiety disorders: Secondary | ICD-10-CM

## 2018-02-09 DIAGNOSIS — E7800 Pure hypercholesterolemia, unspecified: Secondary | ICD-10-CM | POA: Insufficient documentation

## 2018-02-09 DIAGNOSIS — Z79899 Other long term (current) drug therapy: Secondary | ICD-10-CM | POA: Diagnosis not present

## 2018-02-09 DIAGNOSIS — Q211 Atrial septal defect: Secondary | ICD-10-CM | POA: Diagnosis not present

## 2018-02-09 DIAGNOSIS — Z0000 Encounter for general adult medical examination without abnormal findings: Secondary | ICD-10-CM | POA: Diagnosis not present

## 2018-02-09 DIAGNOSIS — I253 Aneurysm of heart: Secondary | ICD-10-CM

## 2018-02-09 MED ORDER — CLONAZEPAM 1 MG PO TABS: ORAL_TABLET | ORAL | 0 refills | Status: DC

## 2018-02-09 MED FILL — PARoxetine HCL 40 MG TABS: 40 | 90 days supply | Qty: 90 | Fill #0

## 2018-02-09 MED FILL — clonazePAM 1 MG TABS: 1 | 30 days supply | Qty: 60 | Fill #0

## 2018-02-09 MED FILL — buPROPion HCL ER (XL) 150 M: 150 | 90 days supply | Qty: 90 | Fill #0

## 2018-02-09 NOTE — Assessment & Plan Note (Signed)
CMP drawn today 

## 2018-02-09 NOTE — Assessment & Plan Note (Signed)
Continue all medications as directed. Per Cardiology, discontinue Clopidogrel (Plavix) Sept 2019- continue Aspirin indefinitely. Referral placed for psychiatrist, re: anxiety, depression, and evaluation of ADHD. Final refill on Paroxetine, Wellbutrin, and Clonazepam provided- please request future refills from psychiatrist. Increase water intake, strive for at least 65 ounces/day.   Follow Mediterranean diet. Continue to reduce alcohol use- you can do it! Increase regular exercise.  Recommend at least 30 minutes daily, 5 days per week of walking, jogging, biking, swimming, YouTube/Pinterest workout videos. We will call you when lab results are available. Complete physical with fasting labs in 6 months.

## 2018-02-09 NOTE — Assessment & Plan Note (Signed)
Successful closure 11/19/17 Per cards stop clopidogrel 02/2018, remain on ASA 81 mg QD indefinitely

## 2018-02-09 NOTE — Telephone Encounter (Signed)
Pt here today for OV with Mina Marble, NP.  Since pt is requesting refill on clonazepam, [place] Controlled Substance Database was reviewed and found to have documentation that pt had #60 tablets filled on 12/31/17 and an additional #60 tablets filled on 01/07/18 at [facility].  Spoke with Delilah Shan at [facility] who stated that the patient only had this medication filled on 01/07/18 and that the RX showing in the database had been reversed in their system and was NOT filled until 01/07/18.  Mina Marble made aware of this error.  Charyl Bigger, CMA

## 2018-02-09 NOTE — Assessment & Plan Note (Signed)
[place] Controlled Substance Database reviewed- spoke with pharmacy and he did not receive 12/31/17 clonazepam 1mg  BID PRN, 60 count refill. He did receive 01/07/18 clonazepam 1mg  BID PRN,  60 count refill. Discussed at length the benefits of safely weaning off clonazepam and continuing to reduce ETOH use Referral to psychiatry placed, future anxiolytic rx to be filled with that practice

## 2018-02-09 NOTE — Telephone Encounter (Signed)
Will discuss continuing clonazepam at OV this afternoon

## 2018-02-09 NOTE — Patient Instructions (Signed)
Mediterranean Diet A Mediterranean diet refers to food and lifestyle choices that are based on the traditions of countries located on the [place]. This way of eating has been shown to help prevent certain conditions and improve outcomes for people who have chronic diseases, like kidney disease and heart disease. What are tips for following this plan? Lifestyle  Cook and eat meals together with your family, when possible.  Drink enough fluid to keep your urine clear or pale yellow.  Be physically active every day. This includes: ? Aerobic exercise like running or swimming. ? Leisure activities like gardening, walking, or housework.  Get 7-8 hours of sleep each night.  If recommended by your health care provider, drink red wine in moderation. This means 1 glass a day for nonpregnant women and 2 glasses a day for men. A glass of wine equals 5 oz (150 mL). Reading food labels  Check the serving size of packaged foods. For foods such as rice and pasta, the serving size refers to the amount of cooked product, not dry.  Check the total fat in packaged foods. Avoid foods that have saturated fat or trans fats.  Check the ingredients list for added sugars, such as corn syrup. Shopping  At the grocery store, buy most of your food from the areas near the walls of the store. This includes: ? Fresh fruits and vegetables (produce). ? Grains, beans, nuts, and seeds. Some of these may be available in unpackaged forms or large amounts (in bulk). ? Fresh seafood. ? Poultry and eggs. ? Low-fat dairy products.  Buy whole ingredients instead of prepackaged foods.  Buy fresh fruits and vegetables in-season from local farmers markets.  Buy frozen fruits and vegetables in resealable bags.  If you do not have access to quality fresh seafood, buy precooked frozen shrimp or canned fish, such as tuna, salmon, or sardines.  Buy small amounts of raw or cooked vegetables, salads, or olives from the  deli or salad bar at your store.  Stock your pantry so you always have certain foods on hand, such as olive oil, canned tuna, canned tomatoes, rice, pasta, and beans. Cooking  Cook foods with extra-virgin olive oil instead of using butter or other vegetable oils.  Have meat as a side dish, and have vegetables or grains as your main dish. This means having meat in small portions or adding small amounts of meat to foods like pasta or stew.  Use beans or vegetables instead of meat in common dishes like chili or lasagna.  Experiment with different cooking methods. Try roasting or broiling vegetables instead of steaming or sauteing them.  Add frozen vegetables to soups, stews, pasta, or rice.  Add nuts or seeds for added healthy fat at each meal. You can add these to yogurt, salads, or vegetable dishes.  Marinate fish or vegetables using olive oil, lemon juice, garlic, and fresh herbs. Meal planning  Plan to eat 1 vegetarian meal one day each week. Try to work up to 2 vegetarian meals, if possible.  Eat seafood 2 or more times a week.  Have healthy snacks readily available, such as: ? Vegetable sticks with hummus. ? Greek yogurt. ? Fruit and nut trail mix.  Eat balanced meals throughout the week. This includes: ? Fruit: 2-3 servings a day ? Vegetables: 4-5 servings a day ? Low-fat dairy: 2 servings a day ? Fish, poultry, or lean meat: 1 serving a day ? Beans and legumes: 2 or more servings a week ? Nuts   and seeds: 1-2 servings a day ? Whole grains: 6-8 servings a day ? Extra-virgin olive oil: 3-4 servings a day  Limit red meat and sweets to only a few servings a month What are my food choices?  Mediterranean diet ? Recommended ? Grains: Whole-grain pasta. Brown rice. Bulgar wheat. Polenta. Couscous. Whole-wheat bread. [place]. ? Vegetables: Artichokes. Beets. Broccoli. Cabbage. Carrots. Eggplant. Green beans. Chard. Kale. Spinach. Onions. Leeks. Peas. Squash.  Tomatoes. Peppers. Radishes. ? Fruits: Apples. Apricots. Avocado. Berries. Bananas. Cherries. Dates. Figs. Grapes. Lemons. Melon. Oranges. Peaches. Plums. Pomegranate. ? Meats and other protein foods: Beans. Almonds. Sunflower seeds. Pine nuts. Peanuts. [place]. Salmon. Scallops. Shrimp. [place]. Tilapia. Clams. Oysters. Eggs. ? Dairy: Low-fat milk. Cheese. Greek yogurt. ? Beverages: Water. Red wine. Herbal tea. ? Fats and oils: Extra virgin olive oil. Avocado oil. Grape seed oil. ? Sweets and desserts: [place] yogurt with honey. Baked apples. Poached pears. Trail mix. ? Seasoning and other foods: Basil. Cilantro. Coriander. Cumin. Mint. Parsley. Sage. Rosemary. Tarragon. Garlic. Oregano. Thyme. Pepper. Balsalmic vinegar. Tahini. Hummus. Tomato sauce. Olives. Mushrooms. ? Limit these ? Grains: Prepackaged pasta or rice dishes. Prepackaged cereal with added sugar. ? Vegetables: Deep fried potatoes (french fries). ? Fruits: Fruit canned in syrup. ? Meats and other protein foods: Beef. Pork. Lamb. Poultry with skin. Hot dogs. [place] Salines. ? Dairy: Ice cream. Sour cream. Whole milk. ? Beverages: Juice. Sugar-sweetened soft drinks. Beer. Liquor and spirits. ? Fats and oils: Butter. Canola oil. Vegetable oil. Beef fat (tallow). Lard. ? Sweets and desserts: Cookies. Cakes. Pies. Candy. ? Seasoning and other foods: Mayonnaise. Premade sauces and marinades. ? The items listed may not be a complete list. Talk with your dietitian about what dietary choices are right for you. Summary  The Mediterranean diet includes both food and lifestyle choices.  Eat a variety of fresh fruits and vegetables, beans, nuts, seeds, and whole grains.  Limit the amount of red meat and sweets that you eat.  Talk with your health care provider about whether it is safe for you to drink red wine in moderation. This means 1 glass a day for nonpregnant women and 2 glasses a day for men. A glass of wine equals 5 oz (150 mL). This information  is not intended to replace advice given to you by your health care provider. Make sure you discuss any questions you have with your health care provider. Document Released: 02/01/2016 Document Revised: 03/05/2016 Document Reviewed: 02/01/2016 Elsevier Interactive Patient Education  2018 [employer].   Alcohol Use Disorder Alcohol use disorder is when your drinking disrupts your daily life. When you have this condition, you drink too much alcohol and you cannot control your drinking. Alcohol use disorder can cause serious problems with your physical health. It can affect your brain, heart, liver, pancreas, immune system, stomach, and intestines. Alcohol use disorder can increase your risk for certain cancers and cause problems with your mental health, such as depression, anxiety, psychosis, delirium, and dementia. People with this disorder risk hurting themselves and others. What are the causes? This condition is caused by drinking too much alcohol over time. It is not caused by drinking too much alcohol only one or two times. Some people with this condition drink alcohol to cope with or escape from negative life events. Others drink to relieve pain or symptoms of mental illness. What increases the risk? You are more likely to develop this condition if:  You have a family history of alcohol use disorder.  Your culture  encourages drinking to the point of intoxication, or makes alcohol easy to get.  You had a mood or conduct disorder in childhood.  You have been a victim of abuse.  You are an adolescent and: ? You have poor grades or difficulties in school. ? Your caregivers do not talk to you about saying no to alcohol, or supervise your activities. ? You are impulsive or you have trouble with self-control.  What are the signs or symptoms? Symptoms of this condition include:  Drinkingmore than you want to.  Drinking for longer than you want to.  Trying several times to drink less or  to control your drinking.  Spending a lot of time getting alcohol, drinking, or recovering from drinking.  Craving alcohol.  Having problems at work, at school, or at home due to drinking.  Having problems in relationships due to drinking.  Drinking when it is dangerous to drink, such as before driving a car.  Continuing to drink even though you know you might have a physical or mental problem related to drinking.  Needing more and more alcohol to get the same effect you want from the alcohol (building up tolerance).  Having symptoms of withdrawal when you stop drinking. Symptoms of withdrawal include: ? Fatigue. ? Nightmares. ? Trouble sleeping. ? Depression. ? Anxiety. ? Fever. ? Seizures. ? Severe confusion. ? Feeling or seeing things that are not there (hallucinations). ? Tremors. ? Rapid heart rate. ? Rapid breathing. ? High blood pressure.  Drinking to avoid symptoms of withdrawal.  How is this diagnosed? This condition is diagnosed with an assessment. Your health care provider may start the assessment by asking three or four questions about your drinking. Your health care provider may perform a physical exam or do lab tests to see if you have physical problems resulting from alcohol use. She or he may refer you to a mental health professional for evaluation. How is this treated? Some people with alcohol use disorder are able to reduce their alcohol use to low-risk levels. Others need to completely quit drinking alcohol. When necessary, mental health professionals with specialized training in substance use treatment can help. Your health care provider can help you decide how severe your alcohol use disorder is and what type of treatment you need. The following forms of treatment are available:  Detoxification. Detoxification involves quitting drinking and using prescription medicines within the first week to help lessen withdrawal symptoms. This treatment is important for  people who have had withdrawal symptoms before and for heavy drinkers who are likely to have withdrawal symptoms. Alcohol withdrawal can be dangerous, and in severe cases, it can cause death. Detoxification may be provided in a home, community, or primary care setting, or in a hospital or substance use treatment facility.  Counseling. This treatment is also called talk therapy. It is provided by substance use treatment counselors. A counselor can address the reasons you use alcohol and suggest ways to keep you from drinking again or to prevent problem drinking. The goals of talk therapy are to: ? Find healthy activities and ways for you to cope with stress. ? Identify and avoid the things that trigger your alcohol use. ? Help you learn how to handle cravings.  Medicines.Medicines can help treat alcohol use disorder by: ? Decreasing alcohol cravings. ? Decreasing the positive feeling you have when you drink alcohol. ? Causing an uncomfortable physical reaction when you drink alcohol (aversion therapy).  Support groups. Support groups are led by people who have  quit drinking. They provide emotional support, advice, and guidance.  These forms of treatment are often combined. Some people with this condition benefit from a combination of treatments provided by specialized substance use treatment centers. Follow these instructions at home:  Take over-the-counter and prescription medicines only as told by your health care provider.  Check with your health care provider before starting any new medicines.  Ask friends and family members not to offer you alcohol.  Avoid situations where alcohol is served, including gatherings where others are drinking alcohol.  Create a plan for what to do when you are tempted to use alcohol.  Find hobbies or activities that you enjoy that do not include alcohol.  Keep all follow-up visits as told by your health care provider. This is important. How is this  prevented?  If you drink, limit alcohol intake to no more than 1 drink a day for nonpregnant women and 2 drinks a day for men. One drink equals 12 oz of beer, 5 oz of wine, or 1 oz of hard liquor.  If you have a mental health condition, get treatment and support.  Do not give alcohol to adolescents.  If you are an adolescent: ? Do not drink alcohol. ? Do not be afraid to say no if someone offers you alcohol. Speak up about why you do not want to drink. You can be a positive role model for your friends and set a good example for those around you by not drinking alcohol. ? If your friends drink, spend time with others who do not drink alcohol. Make new friends who do not use alcohol. ? Find healthy ways to manage stress and emotions, such as meditation or deep breathing, exercise, spending time in nature, listening to music, or talking with a trusted friend or family member. Contact a health care provider if:  You are not able to take your medicines as told.  Your symptoms get worse.  You return to drinking alcohol (relapse) and your symptoms get worse. Get help right away if:  You have thoughts about hurting yourself or others. If you ever feel like you may hurt yourself or others, or have thoughts about taking your own life, get help right away. You can go to your nearest emergency department or call:  Your local emergency services (911 in the [place]  A suicide crisis helpline, such as the [facility] at (830)868-1805. This is open 24 hours a day.  Summary  Alcohol use disorder is when your drinking disrupts your daily life. When you have this condition, you drink too much alcohol and you cannot control your drinking.  Treatment may include detoxification, counseling, medicine, and support groups.  Ask friends and family members not to offer you alcohol. Avoid situations where alcohol is served.  Get help right away if you have thoughts about hurting  yourself or others. This information is not intended to replace advice given to you by your health care provider. Make sure you discuss any questions you have with your health care provider. Document Released: 07/18/2004 Document Revised: 03/07/2016 Document Reviewed: 03/07/2016 Elsevier Interactive Patient Education  2018 [place] all medications as directed. Per Cardiology, discontinue Clopidogrel (Plavix) Sept 2019- continue Aspirin indefinitely. Referral placed for psychiatrist, re: anxiety, depression, and evaluation of ADHD. Final refill on Paroxetine, Wellbutrin, and Clonazepam provided- please request future refills from psychiatrist. Increase water intake, strive for at least 65 ounces/day.   Follow Mediterranean diet. Continue to reduce alcohol use-  you can do it! Increase regular exercise.  Recommend at least 30 minutes daily, 5 days per week of walking, jogging, biking, swimming, YouTube/Pinterest workout videos. We will call you when lab results are available. Complete physical with fasting labs in 6 months.

## 2018-02-09 NOTE — Assessment & Plan Note (Signed)
Lipid panel drawn today Currently taking Atorvastatin 80mg  QD He continues to reduce saturated fat intake and has been slowly increasing regular movement.

## 2018-02-10 ENCOUNTER — Other Ambulatory Visit: Payer: Self-pay | Admitting: Adult Health

## 2018-02-10 LAB — COMPREHENSIVE METABOLIC PANEL
A/G RATIO: 1.6 (ref 1.2–2.2)
ALT: 49 IU/L — AB (ref 0–44)
AST: 38 IU/L (ref 0–40)
Albumin: 4.7 g/dL (ref 3.5–5.5)
Alkaline Phosphatase: 96 IU/L (ref 39–117)
BILIRUBIN TOTAL: 0.8 mg/dL (ref 0.0–1.2)
BUN/Creatinine Ratio: 11 (ref 9–20)
BUN: 13 mg/dL (ref 6–24)
CALCIUM: 9.8 mg/dL (ref 8.7–10.2)
CHLORIDE: 100 mmol/L (ref 96–106)
CO2: 22 mmol/L (ref 20–29)
Creatinine, Ser: 1.14 mg/dL (ref 0.76–1.27)
GFR calc Af Amer: 89 mL/min/{1.73_m2} (ref >59)
GFR, EST NON AFRICAN AMERICAN: 77 mL/min/{1.73_m2} (ref >59)
GLOBULIN, TOTAL: 2.9 g/dL (ref 1.5–4.5)
Glucose: 94 mg/dL (ref 65–99)
POTASSIUM: 4.7 mmol/L (ref 3.5–5.2)
SODIUM: 140 mmol/L (ref 134–144)
Total Protein: 7.6 g/dL (ref 6.0–8.5)

## 2018-02-10 LAB — LIPID PANEL
CHOL/HDL RATIO: 4 ratio (ref 0.0–5.0)
CHOLESTEROL TOTAL: 136 mg/dL (ref 100–199)
HDL: 34 mg/dL — AB (ref >39)
LDL Calculated: 39 mg/dL (ref 0–99)
TRIGLYCERIDES: 316 mg/dL — AB (ref 0–149)
VLDL Cholesterol Cal: 63 mg/dL — ABNORMAL HIGH (ref 5–40)

## 2018-02-10 MED ORDER — ATORVASTATIN CALCIUM 20 MG PO TABS: ORAL_TABLET | ORAL | 1 refills | Status: DC

## 2018-02-10 MED FILL — ATORVASTATIN CALCIUM 20 MG: 20 | 90 days supply | Qty: 90 | Fill #0

## 2018-02-12 ENCOUNTER — Other Ambulatory Visit: Payer: Self-pay | Admitting: Adult Health

## 2018-02-16 ENCOUNTER — Ambulatory Visit: Payer: 59 | Admitting: Neurology

## 2018-02-16 ENCOUNTER — Ambulatory Visit: Payer: 59 | Admitting: Adult Health

## 2018-03-06 ENCOUNTER — Ambulatory Visit (INDEPENDENT_AMBULATORY_CARE_PROVIDER_SITE_OTHER): Payer: 59 | Admitting: *Deleted

## 2018-03-06 DIAGNOSIS — I639 Cerebral infarction, unspecified: Secondary | ICD-10-CM

## 2018-03-06 NOTE — Progress Notes (Signed)
Carelink Summary Report / Loop Recorder 

## 2018-03-09 ENCOUNTER — Encounter: Payer: Self-pay | Admitting: Adult Health

## 2018-03-10 ENCOUNTER — Other Ambulatory Visit: Payer: Self-pay | Admitting: Adult Health

## 2018-03-10 DIAGNOSIS — F419 Anxiety disorder, unspecified: Secondary | ICD-10-CM

## 2018-03-10 NOTE — Progress Notes (Unsigned)
[place]

## 2018-03-11 ENCOUNTER — Encounter: Payer: Self-pay | Admitting: Adult Health

## 2018-03-11 ENCOUNTER — Other Ambulatory Visit: Payer: Self-pay | Admitting: Adult Health

## 2018-03-11 DIAGNOSIS — F419 Anxiety disorder, unspecified: Secondary | ICD-10-CM

## 2018-03-11 DIAGNOSIS — F418 Other specified anxiety disorders: Secondary | ICD-10-CM

## 2018-03-11 LAB — CUP PACEART REMOTE DEVICE CHECK
Date Time Interrogation Session: 20190811194009
MDC IDC PG IMPLANT DT: 20190404

## 2018-03-15 ENCOUNTER — Other Ambulatory Visit: Payer: Self-pay

## 2018-03-15 ENCOUNTER — Encounter (HOSPITAL_COMMUNITY): Payer: Self-pay | Admitting: Emergency Medicine

## 2018-03-15 ENCOUNTER — Emergency Department (HOSPITAL_COMMUNITY): Payer: 59

## 2018-03-15 ENCOUNTER — Emergency Department (HOSPITAL_COMMUNITY)
Admission: EM | Admit: 2018-03-15 | Discharge: 2018-03-16 | Disposition: A | Discharge status: Home / Self Care | Payer: 59 | Attending: Emergency Medicine | Admitting: Emergency Medicine

## 2018-03-15 DIAGNOSIS — Z7982 Long term (current) use of aspirin: Secondary | ICD-10-CM | POA: Insufficient documentation

## 2018-03-15 DIAGNOSIS — W28XXXA Contact with powered lawn mower, initial encounter: Secondary | ICD-10-CM | POA: Diagnosis not present

## 2018-03-15 DIAGNOSIS — S91114A Laceration without foreign body of right lesser toe(s) without damage to nail, initial encounter: Secondary | ICD-10-CM | POA: Diagnosis not present

## 2018-03-15 DIAGNOSIS — Z79899 Other long term (current) drug therapy: Secondary | ICD-10-CM | POA: Insufficient documentation

## 2018-03-15 DIAGNOSIS — Y92017 Garden or yard in single-family (private) house as the place of occurrence of the external cause: Secondary | ICD-10-CM | POA: Insufficient documentation

## 2018-03-15 DIAGNOSIS — Z89411 Acquired absence of right great toe: Secondary | ICD-10-CM | POA: Diagnosis not present

## 2018-03-15 DIAGNOSIS — IMO0002 Reserved for concepts with insufficient information to code with codable children: Secondary | ICD-10-CM

## 2018-03-15 DIAGNOSIS — Y999 Unspecified external cause status: Secondary | ICD-10-CM | POA: Insufficient documentation

## 2018-03-15 DIAGNOSIS — Z87891 Personal history of nicotine dependence: Secondary | ICD-10-CM | POA: Insufficient documentation

## 2018-03-15 DIAGNOSIS — Y93H9 Activity, other involving exterior property and land maintenance, building and construction: Secondary | ICD-10-CM | POA: Diagnosis not present

## 2018-03-15 DIAGNOSIS — Z7902 Long term (current) use of antithrombotics/antiplatelets: Secondary | ICD-10-CM | POA: Diagnosis not present

## 2018-03-15 DIAGNOSIS — S98121A Partial traumatic amputation of right great toe, initial encounter: Secondary | ICD-10-CM | POA: Insufficient documentation

## 2018-03-15 DIAGNOSIS — Z89421 Acquired absence of other right toe(s): Secondary | ICD-10-CM | POA: Diagnosis not present

## 2018-03-15 MED ORDER — BUPIVACAINE HCL 0.5 % IJ SOLN
50.0000 mL | Freq: Once | INTRAMUSCULAR | Status: AC
  Administered 2018-03-15: 50 mL
  Filled 2018-03-15: qty 50

## 2018-03-15 MED ORDER — OXYCODONE-ACETAMINOPHEN 5-325 MG PO TABS
1.0000 | ORAL_TABLET | Freq: Once | ORAL | Status: AC
  Administered 2018-03-15: 1 via ORAL
  Filled 2018-03-15: qty 1

## 2018-03-15 MED ORDER — CLINDAMYCIN HCL 150 MG PO CAPS: 450.0000 mg | ORAL_CAPSULE | Freq: Three times a day (TID) | ORAL | 0 refills | Status: AC

## 2018-03-15 NOTE — Op Note (Signed)
Procedure Note  Steven Herrera male 47 y.o. 03/15/2018  Preop diagnosis: Traumatic right hallux tip amputation Second toe laceration  Postop diagnosis: Same  Procedure: Irrigation debridement site of open fracture Revision right hallux amputation Irrigation debridement left second toe   Surgeon: Erle Crocker   Assistants: None  Anesthesia: Local anesthesia 0.5% bupivacaine   Indications:46 y.o. male was mowing his lawn this afternoon when he hit a rock causing him to stumble in his right foot went under the mower.  He had a traumatic right hallux tip amputation.  He was able to find the tip of the toe.  The presents the emergency department.  He had diffuse bleeding from the site and this was wrapped.  X-rays revealed a amputation through the distal phalanx.  After inspection of the site I decided the best course of action was irrigation debridement and revision amputation.  He also had a small laceration on the second toe through the nailbed with traumatic nail avulsion.  Had a long discussion with the patient explaining the risk benefits alternatives to surgery and he consented to the proposed procedure.  Procedure Detail: Identified the patient in the ER.  Informed consent was signed.  Appropriate extremity was marked.  Using iodine I cleaned off the area for the local anesthetic block and performed a digital block of the hallux and second toe using half percent Marcaine.  Once anesthetic had set up I began by placing a turnicot about the hallux and then cleaning off the hallux and exploring the wound.  There was exposed bone of the remaining distal phalanx that was fractured.  There was a large clot formation.  I remove the clot.  After the clot was removed the toe was copiously irrigated with 1 L of normal saline.  I then extended the incision on the medial side more proximally to the IP joint.  I then elevated the soft tissue off the remaining distal phalanx and  disarticulated the IP joint.  There were fragments of bone within the remaining soft tissues that were removed.  I then trimmed off some nonviable appearing skin with scissors and 15 blade sharply.  I then debulked the plantar flap and was able to close the soft tissue over the proximal phalanx.  I did this with 3-0 Monocryl and 3-0 nylon suture in an interrupted fashion.  The second toe was then inspected.  There is a large clot over the sterile matrix.  This was removed.  There was no exposed bone.  This was then irrigated and a small flap of devitalized skin was removed sharply with scissors.  Was then irrigated copiously with normal saline.  I then placed Xeroform and 4 x 4's with Kling and Coban wrap.  Postoperatively he will be placed in a postop shoe.  He can heel weight-bear. He will be given antibiotics in the ER. Follow-up with me in 1 week.  Estimated Blood Loss:  less than 100 mL         Drains: none  Blood Given: none         Specimens: none       Complications:  * No surgery found *         Disposition: Will be discharged from the ED         Condition: stable

## 2018-03-15 NOTE — ED Triage Notes (Addendum)
Partial amputation of R great toe including nail and large laceration to R 2nd toe from a push mower within the last hour.  Bleeding through bandage on arrival.  [facility], NP in to assess pt.  2 ABD pads placed over foot.

## 2018-03-15 NOTE — ED Notes (Signed)
MD at bedside. 

## 2018-03-15 NOTE — ED Notes (Signed)
Surgeon has finished procedure and speaking with EDP-Monique,RN

## 2018-03-15 NOTE — Consult Note (Signed)
Reason for Consult: Traumatic right hallux amputation by lawnmower Referring Physician: Debroah Baller, NP  Steven Herrera is an 47 y.o. male.  HPI: Patient was mowing the lawn and had a back-up of a rock which caused the mower to roll to the side.  His right foot went under the mower while the blade was moving.  He had immediate pain and bleeding to his right foot.  He found the tip of his hallux and brought that on ice to the ER.  Really he has pain in the right foot.  There is also bleeding.  He was given some pain medication which has diminished his pain slightly.  He also has some pain in the second toe.  No other foot pain.  Of note patient had a history of a stroke and is on anticoagulation, Plavix.  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 High cholesterol   . PFO (patent foramen ovale)    a. s/p PFO closure by Dr. Burt Knack on 11/19/17  . Stroke [facility])     Past Surgical History:  Procedure Laterality Date  . CARPAL TUNNEL RELEASE    . LOOP RECORDER INSERTION N/A 09/25/2017   Procedure: LOOP RECORDER INSERTION;  Surgeon: [name], MD;  Location: [facility];  Service: Cardiovascular;  Laterality: N/A;  . MOUTH SURGERY    . PATENT FORAMEN OVALE(PFO) CLOSURE N/A 11/19/2017   Procedure: PATENT FORAMEN OVALE (PFO) CLOSURE;  Surgeon: [name], MD;  Location: [facility];  Service: Cardiovascular;  Laterality: N/A;  . spider bite    . TEE WITHOUT CARDIOVERSION N/A 09/25/2017   Procedure: TRANSESOPHAGEAL ECHOCARDIOGRAM (TEE) WITH LOOP;  Surgeon: Pixie Casino, MD;  Location: [facility] ENDOSCOPY;  Service: Cardiovascular;  Laterality: N/A;  . VASECTOMY      Family History  Problem Relation Age of Onset  . Stroke Father        disabling CVA in early-mid 50's  . Hypertension Father   . Alcohol abuse Father   . Heart disease Mother   . Dementia Mother   . Atrial fibrillation Mother   . Drug abuse Sister   . Hypertension  Brother   . Healthy Sister     Social History:  reports that he quit smoking about 7 months ago. His smoking use included cigarettes. He has a 7.00 pack-year smoking history. He has never used smokeless tobacco. He reports that he drinks about 1.0 standard drinks of alcohol per week. He reports that he has current or past drug history. Drug: Marijuana.  Allergies:  Allergies  Allergen Reactions  . Ibuprofen     Bloody stools  . Penicillins Hives    Has patient had a PCN reaction causing immediate rash, facial/tongue/throat swelling, SOB or lightheadedness with hypotension: No Has patient had a PCN reaction causing severe rash involving mucus membranes or skin necrosis: Yes Has patient had a PCN reaction that required hospitalization: no Has patient had a PCN reaction occurring within the last 10 years: no If all of the above answers are "NO", then may proceed with Cephalosporin use.    Medications: I have reviewed the patient's current medications.  No results found for this or any previous visit (from the past 48 hour(s)).  Dg Foot Complete Right  Result Date: 03/15/2018 CLINICAL DATA:  Toe amputation with lawnmower. EXAM: RIGHT FOOT COMPLETE - 3+ VIEW COMPARISON:  None. FINDINGS: Amputation distal great toe identified at the  level of the base of the distal phalanx. Overlying soft tissue irregularity and swelling is compatible with the amputation. Soft tissue in the toes is obscured by overlying bandage material. No other fracture evident. IMPRESSION: Great toe amputation with loss of distal phalanx at the level of the base, just above the articular surface. Electronically Signed   By: Misty Stanley M.D.   On: 03/15/2018 20:12    Review of Systems  Constitutional: Negative.   HENT: Negative.   Eyes: Negative.   Respiratory: Negative.   Cardiovascular: Negative.   Gastrointestinal: Negative.   Musculoskeletal:       Right foot toe pain and bleeding  Skin: Negative.    Neurological: Negative.   Psychiatric/Behavioral: Negative.    Blood pressure (!) 133/92, pulse (!) 107, temperature 99.4 F (37.4 C), temperature source Oral, resp. rate 20, SpO2 95 %. Physical Exam  Constitutional: He is oriented to person, place, and time. He appears well-developed and well-nourished.  HENT:  Head: Normocephalic.  Eyes: Pupils are equal, round, and reactive to light. Conjunctivae are normal.  Neck: Normal range of motion. Neck supple.  Cardiovascular: Normal rate.  Respiratory: Effort normal.  GI: Soft.  Musculoskeletal:  Evaluation the right foot demonstrates traumatic laceration of the hallux just distal to the IP joint.  There is some pulsating blood.  Also on the second toe there is been a nail avulsion with small soft tissue injury to the tip of the toe.  Otherwise no abnormality to the foot.  He has sensation intact to light touch in the dorsal and plantar surface of the foot.  Palpable dorsalis pedis pulse.  He is able to dorsiflex plantarflex the ankle.  No injury to the left foot noted.    Neurological: He is alert and oriented to person, place, and time.  Skin: Skin is warm.  Psychiatric: He has a normal mood and affect. His behavior is normal.    Assessment/Plan: Steven Herrera has a traumatic hallux amputation.  This is just distal to the IP joint.  He will require a revision amputation of his right hallux in the emergency department.  Due to lack of soft tissue for coverage I will likely have to disarticulate the hallux IP joint.  If I am able to I will tenodesed the EHL to the [facility] tendon.  With the second toe L irrigate this and it inspected it.  We will just need a soft dressing.  I discussed the risk benefits alternatives of surgery.  He understands these risks and agreed to undergo the procedure.    Erle Crocker 03/15/2018, 9:47 PM

## 2018-03-15 NOTE — ED Notes (Addendum)
Portable x-ray completed at triage

## 2018-03-15 NOTE — ED Notes (Signed)
ED Provider at bedside. 

## 2018-03-15 NOTE — ED Provider Notes (Signed)
[facility] EMERGENCY DEPARTMENT Provider Note   CSN: [id number] Arrival date & time: 03/15/18  1911     History   Chief Complaint Chief Complaint  Patient presents with  . partial toe amputation    HPI Steven Herrera is a 47 y.o. male.  47 y/o male with a PMH of cholesterol, CVA presents to the ED s/p right toe amputation x 1 hour ago. Patient reports he was mowing the lawn when he hit a rock causing him to run over his right toe. Patient reports they immediately wrapped it and came into the ED. He denies taking any therapy for his pain. Patient has not put any weight bear on his right foot. He is currently on plavix for a previous Stroke but states he is supposed to come off plavix this month. He denies any other injuries or complaints.      Past Medical History:  Diagnosis Date  . High cholesterol   . PFO (patent foramen ovale)    a. s/p PFO closure by Dr. Burt Knack on 11/19/17  . Stroke [facility])     Patient Active Problem List   Diagnosis Date Noted  . High cholesterol 02/09/2018  . 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 On statin therapy 08/25/2017  . Healthcare maintenance 07/11/2017  . Depression with anxiety 06/27/2017  . Cerebrovascular accident (CVA) ([place]) 06/27/2017  . Acute ischemic stroke ([place]) 06/26/2017  . Elevated blood-pressure reading without diagnosis of hypertension 06/26/2017  . NEVUS 02/13/2007    Past Surgical History:  Procedure Laterality Date  . CARPAL TUNNEL RELEASE    . LOOP RECORDER INSERTION N/A 09/25/2017   Procedure: LOOP RECORDER INSERTION;  Surgeon: [name], MD;  Location: [facility] CV LAB;  Service: Cardiovascular;  Laterality: N/A;  . MOUTH SURGERY    . PATENT FORAMEN OVALE(PFO) CLOSURE N/A 11/19/2017   Procedure: PATENT FORAMEN OVALE (PFO) CLOSURE;  Surgeon: [name], MD;  Location: [facility] CV LAB;  Service: Cardiovascular;  Laterality: N/A;  . spider bite    . TEE WITHOUT CARDIOVERSION  N/A 09/25/2017   Procedure: TRANSESOPHAGEAL ECHOCARDIOGRAM (TEE) WITH LOOP;  Surgeon: Pixie Casino, MD;  Location: [facility] END[facility];  Service: Cardiovascular;  Laterality: N/A;  . VASECTOMY          Home Medications    Prior to Admission medications   Medication Sig Start Date End Date Taking? Authorizing Provider  aspirin EC 81 MG tablet Take 81 mg by mouth daily with lunch.    Yes [provider]  atorvastatin (LIPITOR) 20 MG tablet TAKE 1 TABLET BY MOUTH ONCE DAILY AT 6 PM Patient taking differently: Take 20 mg by mouth daily with lunch.  02/10/18  Yes Danford, Valetta Fuller D, NP  buPROPion (WELLBUTRIN XL) 150 MG 24 hr tablet TAKE 1 TABLET BY MOUTH ONCE DAILY Patient taking differently: Take 150 mg by mouth daily with lunch.  02/09/18  Yes Danford, Valetta Fuller D, NP  clopidogrel (PLAVIX) 75 MG tablet Take 1 tablet (75 mg total) by mouth daily. Patient taking differently: Take 75 mg by mouth daily with lunch.  11/06/17  Yes [name], MD  Menthol, Topical Analgesic, (ICY HOT EX) Apply 1 application topically 3 (three) times daily as needed (pain).    Yes [provider]  Multiple Vitamin (MULTIVITAMIN WITH MINERALS) TABS tablet Take 1 tablet by mouth daily with lunch.   Yes [provider]  naproxen sodium (ALEVE) 220 MG tablet Take 220-440 mg by mouth 3 (three)  times daily as needed (pain/headaches).   Yes [provider]  omega-3 acid ethyl esters (LOVAZA) 1 g capsule Take 2 capsules (2 g total) by mouth 2 (two) times daily. 11/03/17  Yes Danford, Valetta Fuller D, NP  PARoxetine (PAXIL) 40 MG tablet TAKE 1 TABLET BY MOUTH ONCE DAILY Patient taking differently: Take 40 mg by mouth daily with lunch.  02/09/18  Yes Danford, Berna Spare, NP  clindamycin (CLEOCIN) 150 MG capsule Take 3 capsules (450 mg total) by mouth 3 (three) times daily for 7 days. 03/15/18 03/22/18  Janeece Fitting, PA-C  clonazePAM (KLONOPIN) 1 MG tablet TAKE 1 TABLET BY MOUTH TWICE DAILY. No additional refills will be  allowed. Patient taking differently: Take 2 mg by mouth daily with lunch.  02/09/18   [name], NP    Family History Family History  Problem Relation Age of Onset  . Stroke Father        disabling CVA in early-mid 50's  . Hypertension Father   . Alcohol abuse Father   . Heart disease Mother   . Dementia Mother   . Atrial fibrillation Mother   . Drug abuse Sister   . Hypertension Brother   . Healthy Sister     Social History Social History   Tobacco Use  . Smoking status: Former Smoker    Packs/day: 0.25    Years: 28.00    Pack years: 7.00    Types: Cigarettes    Last attempt to quit: 07/25/2017    Years since quitting: 0.6  . Smokeless tobacco: Never Used  Substance Use Topics  . Alcohol use: Yes    Alcohol/week: 1.0 standard drinks    Types: 1 Shots of liquor per week    Comment: 0.5 pints nightly, daily  . Drug use: Yes    Types: Marijuana    Comment: takes daily for pain leisure      Allergies   Ibuprofen and Penicillins   Review of Systems Review of Systems  Constitutional: Negative for fever.  Respiratory: Negative for shortness of breath.   Cardiovascular: Negative for palpitations.  Genitourinary: Negative for flank pain.  Musculoskeletal: Positive for gait problem and myalgias.  Skin: Positive for wound.  Neurological: Negative for syncope.  Psychiatric/Behavioral: Negative for decreased concentration.  All other systems reviewed and are negative.    Physical Exam Updated Vital Signs BP (!) 133/92 (BP Location: Right Arm)   Pulse (!) 107   Temp 99.4 F (37.4 C) (Oral)   Resp 20   SpO2 95%   Physical Exam  Constitutional: He is oriented to person, place, and time. He appears well-developed and well-nourished.  HENT:  Head: Normocephalic and atraumatic.  Neck: Normal range of motion. Neck supple.  Cardiovascular: Normal heart sounds.  Pulses:      Dorsalis pedis pulses are Detected w/ doppler on the right side.       Posterior  tibial pulses are Detected w/ doppler on the right side.  Pulmonary/Chest: Breath sounds normal.  Abdominal: Soft. Bowel sounds are normal. There is no tenderness.  Musculoskeletal: He exhibits tenderness and deformity.       Right foot: There is deformity.  Feet:  Right Foot:  Skin Integrity: Positive for erythema.  Neurological: He is alert and oriented to person, place, and time.  Skin: Skin is warm and dry.  Nursing note and vitals reviewed.      ED Treatments / Results  Labs (all labs ordered are listed, but only abnormal results are displayed)  Labs Reviewed - No data to display  EKG None  Radiology Dg Foot Complete Right  Result Date: 03/15/2018 CLINICAL DATA:  Toe amputation with lawnmower. EXAM: RIGHT FOOT COMPLETE - 3+ VIEW COMPARISON:  None. FINDINGS: Amputation distal great toe identified at the level of the base of the distal phalanx. Overlying soft tissue irregularity and swelling is compatible with the amputation. Soft tissue in the toes is obscured by overlying bandage material. No other fracture evident. IMPRESSION: Great toe amputation with loss of distal phalanx at the level of the base, just above the articular surface. Electronically Signed   By: Misty Stanley M.D.   On: 03/15/2018 20:12    Procedures Procedures (including critical care time)  Medications Ordered in ED Medications  bupivacaine (MARCAINE) 0.5 % (with pres) injection 50 mL (has no administration in time range)  oxyCODONE-acetaminophen (PERCOCET/ROXICET) 5-325 MG per tablet 1 tablet (1 tablet Oral Given 03/15/18 2022)     Initial Impression / Assessment and Plan / ED Course  I have reviewed the triage vital signs and the nursing notes.  Pertinent labs & imaging results that were available during my care of the patient were reviewed by me and considered in my medical decision making (see chart for details).     Presents with right toe amputation x1 hour after mowing the lawn.Upon  examination pulses are present and sensation is intact aside from missing right toe. Pulses checked with doppler. DG foot showed great toe amputation with loss of distal phalanx at the level of the base, just above the articular surface.Call placed to orthopedist on call Dr. Berna Bue will come see patient in the ED.  Dr. Berna Bue has revised the amputation and placed sutures at the bedside,he is instructed to follow up with him outpatient, will place him on clindamycin and provide him with a post op shoe along with give him crutches.Patient understands Dr. Berna Bue office will reach out to him this week. He agrees with plan. Vitals stable for discharge, patient stable for discharge.   Final Clinical Impressions(s) / ED Diagnoses   Final diagnoses:  Great toe amputation status, right [facility])    ED Discharge Orders         Ordered    clindamycin (CLEOCIN) 150 MG capsule  3 times daily     03/15/18 2327           Janeece Fitting, PA-C 03/15/18 2340    Malvin Johns, MD 03/15/18 2354

## 2018-03-15 NOTE — ED Provider Notes (Signed)
Patient placed in Quick Look pathway, seen and evaluated   Chief Complaint: toe injury  HPI:  Steven Herrera is a 47 y.o. male who presents to the ED with injury to the right great toe and second toe. Patient reports he was using a push mower and got his foot under it and cut his toes.   **patient is taking blood thinners  ROS: skin: wounds  M/S: partial amputation great toe  Physical Exam:  BP (!) 133/92 (BP Location: Right Arm)   Pulse (!) 107   Temp 99.4 F (37.4 C) (Oral)   Resp 20   SpO2 95%    Gen: No distress  Neuro: Awake and Alert  Skin: wounds to the right great and second toe. Partial amputation right great toe. Wounds are actively bleeding. Pressure dressing applied.    Initiation of care has begun. The patient has been counseled on the process, plan, and necessity for staying for the completion/evaluation, and the remainder of the medical screening examination    Ashley Murrain, NP 03/15/18 1933    Maudie Flakes, MD 03/15/18 2306

## 2018-03-15 NOTE — Discharge Instructions (Addendum)
I have prescribed antibiotics please take as directed. Dr. Berna Bue office will call you this week to schedule an appointment. Please keep wound clean and dry.If you experience any fever, drainage from the wound or worsening symptoms please return to the ED for reevaluation.

## 2018-03-16 ENCOUNTER — Other Ambulatory Visit: Payer: Self-pay | Admitting: Adult Health

## 2018-03-16 MED ORDER — OXYCODONE-ACETAMINOPHEN 5-325 MG PO TABS: 1.0000 | ORAL_TABLET | ORAL | 0 refills | Status: AC | PRN

## 2018-03-16 MED ORDER — CLONAZEPAM 1 MG PO TABS: 2.0000 mg | ORAL_TABLET | Freq: Every day | ORAL | 0 refills | Status: DC

## 2018-03-16 MED FILL — OXYCODONE-ACETAMINOPHEN 5-3: 5-325 | 1 days supply | Qty: 6 | Fill #0

## 2018-03-16 MED FILL — clonazePAM 1 MG TABS: 1 | 30 days supply | Qty: 60 | Fill #0

## 2018-03-16 MED FILL — CLINDAMYCIN HCL 150 MG CAPS: 150 | 7 days supply | Qty: 63 | Fill #0

## 2018-03-21 LAB — CUP PACEART REMOTE DEVICE CHECK
Implantable Pulse Generator Implant Date: 20190404
MDC IDC SESS DTM: 20190913201116

## 2018-03-23 DIAGNOSIS — S98121D Partial traumatic amputation of right great toe, subsequent encounter: Secondary | ICD-10-CM | POA: Diagnosis not present

## 2018-03-23 MED FILL — traMADol HCL 50 MG TABS: 50 | 7 days supply | Qty: 30 | Fill #0

## 2018-03-23 MED FILL — CIPROFLOXACIN HCL 500 MG TA: 500 | 14 days supply | Qty: 28 | Fill #0

## 2018-03-31 ENCOUNTER — Encounter: Payer: Self-pay | Admitting: Adult Health

## 2018-04-01 ENCOUNTER — Encounter: Payer: Self-pay | Admitting: Adult Health

## 2018-04-06 DIAGNOSIS — S91114D Laceration without foreign body of right lesser toe(s) without damage to nail, subsequent encounter: Secondary | ICD-10-CM | POA: Diagnosis not present

## 2018-04-08 ENCOUNTER — Ambulatory Visit (INDEPENDENT_AMBULATORY_CARE_PROVIDER_SITE_OTHER): Payer: 59 | Admitting: *Deleted

## 2018-04-08 DIAGNOSIS — I639 Cerebral infarction, unspecified: Secondary | ICD-10-CM

## 2018-04-09 NOTE — Progress Notes (Signed)
Carelink Summary Report / Loop Recorder 

## 2018-04-14 ENCOUNTER — Encounter: Payer: Self-pay | Admitting: Adult Health

## 2018-04-15 ENCOUNTER — Other Ambulatory Visit: Payer: Self-pay

## 2018-04-15 MED ORDER — CLONAZEPAM 1 MG PO TABS: 2.0000 mg | ORAL_TABLET | Freq: Every day | ORAL | 0 refills | Status: DC

## 2018-04-15 MED FILL — clonazePAM 1 MG TABS: 1 | 30 days supply | Qty: 60 | Fill #0

## 2018-04-15 NOTE — Telephone Encounter (Signed)
Received thru [employer].  Message   Hey Doc, hope you're having a great week so far. It's about time too reup on the kalinapen. Iv got a few days left.

## 2018-04-20 ENCOUNTER — Encounter: Payer: Self-pay | Admitting: Adult Health

## 2018-04-20 DIAGNOSIS — S91114D Laceration without foreign body of right lesser toe(s) without damage to nail, subsequent encounter: Secondary | ICD-10-CM | POA: Diagnosis not present

## 2018-04-20 LAB — CUP PACEART REMOTE DEVICE CHECK
Implantable Pulse Generator Implant Date: 20190404
MDC IDC SESS DTM: 20191016204117

## 2018-05-11 ENCOUNTER — Ambulatory Visit (INDEPENDENT_AMBULATORY_CARE_PROVIDER_SITE_OTHER): Payer: 59

## 2018-05-11 DIAGNOSIS — I639 Cerebral infarction, unspecified: Secondary | ICD-10-CM

## 2018-05-13 NOTE — Progress Notes (Signed)
Carelink Summary Report / Loop Recorder 

## 2018-05-18 DIAGNOSIS — S91114D Laceration without foreign body of right lesser toe(s) without damage to nail, subsequent encounter: Secondary | ICD-10-CM | POA: Diagnosis not present

## 2018-05-19 ENCOUNTER — Other Ambulatory Visit: Payer: Self-pay | Admitting: Adult Health

## 2018-05-19 MED FILL — clonazePAM 1 MG TABS: 1 | 30 days supply | Qty: 60 | Fill #0

## 2018-05-19 MED FILL — buPROPion HCL ER (XL) 150 M: 150 | 90 days supply | Qty: 90 | Fill #0

## 2018-05-19 MED FILL — ATORVASTATIN CALCIUM 20 MG: 20 | 90 days supply | Qty: 90 | Fill #1

## 2018-05-19 MED FILL — PARoxetine HCL 40 MG TABS: 40 | 90 days supply | Qty: 90 | Fill #0

## 2018-05-19 NOTE — Telephone Encounter (Signed)
Per Steven Herrera's last MyChart message to pt:   Steven Herrera, Steven Spare, Steven Herrera  to Steven Herrera        12:11 PM  Good Afternoon Steven Herrera,  Per previous MyChart communication-  I will provide two final refills on Clonazepam (04/15/18, 05/16/18)  The fill on 05/16/18 will provide 30 day supply which will cover you until 06/15/18.  Per your communication with Kenney Houseman, you have an appt with Psychiatry 06/01/18.  I am unclear where the gap in medication will occur.  Please let me know if you need further clarification.  Sincerely,  Valetta Fuller    Last read by Steven Herrera at 3:43 PM on 04/21/2018.    [place] controlled substance database reviewed and no aberrancies noted.    Charyl Bigger, CMA

## 2018-06-01 ENCOUNTER — Encounter (HOSPITAL_COMMUNITY): Payer: Self-pay | Admitting: Psychiatry

## 2018-06-01 ENCOUNTER — Encounter

## 2018-06-01 ENCOUNTER — Ambulatory Visit (INDEPENDENT_AMBULATORY_CARE_PROVIDER_SITE_OTHER): Payer: 59 | Admitting: Psychiatry

## 2018-06-01 VITALS — BP 121/81 | HR 60 | Ht 72.0 in | Wt 216.0 lb

## 2018-06-01 DIAGNOSIS — F1010 Alcohol abuse, uncomplicated: Secondary | ICD-10-CM

## 2018-06-01 DIAGNOSIS — F1210 Cannabis abuse, uncomplicated: Secondary | ICD-10-CM

## 2018-06-01 DIAGNOSIS — F319 Bipolar disorder, unspecified: Secondary | ICD-10-CM | POA: Diagnosis not present

## 2018-06-01 DIAGNOSIS — F4310 Post-traumatic stress disorder, unspecified: Secondary | ICD-10-CM

## 2018-06-01 DIAGNOSIS — E559 Vitamin D deficiency, unspecified: Secondary | ICD-10-CM | POA: Insufficient documentation

## 2018-06-01 MED ORDER — LAMOTRIGINE 25 MG PO TABS: ORAL_TABLET | ORAL | 1 refills | Status: DC

## 2018-06-01 MED ORDER — QUETIAPINE FUMARATE 50 MG PO TABS: ORAL_TABLET | ORAL | 1 refills | Status: DC

## 2018-06-01 MED FILL — lamoTRIgine 25 MG TABS: 25 | 30 days supply | Qty: 60 | Fill #0

## 2018-06-01 MED FILL — QUETIAPINE FUMARATE 50 MG T: 50 | 30 days supply | Qty: 60 | Fill #0
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
## 2018-06-15 ENCOUNTER — Ambulatory Visit (INDEPENDENT_AMBULATORY_CARE_PROVIDER_SITE_OTHER): Payer: 59

## 2018-06-15 DIAGNOSIS — I639 Cerebral infarction, unspecified: Secondary | ICD-10-CM | POA: Diagnosis not present

## 2018-06-15 LAB — CUP PACEART REMOTE DEVICE CHECK
Date Time Interrogation Session: 20191221224012
MDC IDC PG IMPLANT DT: 20190404

## 2018-06-18 NOTE — Progress Notes (Signed)
Carelink Summary Report / Loop Recorder 

## 2018-06-28 LAB — CUP PACEART REMOTE DEVICE CHECK
Date Time Interrogation Session: 20191118214025
Implantable Pulse Generator Implant Date: 20190404

## 2018-07-06 ENCOUNTER — Encounter (HOSPITAL_COMMUNITY): Payer: Self-pay | Admitting: Licensed Clinical Social Worker

## 2018-07-06 ENCOUNTER — Ambulatory Visit (INDEPENDENT_AMBULATORY_CARE_PROVIDER_SITE_OTHER): Payer: 59 | Admitting: Licensed Clinical Social Worker

## 2018-07-06 DIAGNOSIS — F319 Bipolar disorder, unspecified: Secondary | ICD-10-CM | POA: Diagnosis not present
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
## 2018-07-16 ENCOUNTER — Ambulatory Visit (INDEPENDENT_AMBULATORY_CARE_PROVIDER_SITE_OTHER): Payer: 59

## 2018-07-16 DIAGNOSIS — I639 Cerebral infarction, unspecified: Secondary | ICD-10-CM | POA: Diagnosis not present

## 2018-07-17 NOTE — Progress Notes (Signed)
Carelink Summary Report / Loop Recorder 

## 2018-07-19 LAB — CUP PACEART REMOTE DEVICE CHECK
Date Time Interrogation Session: 20200123230957
Implantable Pulse Generator Implant Date: 20190404

## 2018-07-24 ENCOUNTER — Ambulatory Visit (INDEPENDENT_AMBULATORY_CARE_PROVIDER_SITE_OTHER): Payer: 59 | Admitting: Psychiatry

## 2018-07-24 ENCOUNTER — Encounter (HOSPITAL_COMMUNITY): Payer: Self-pay | Admitting: Psychiatry

## 2018-07-24 DIAGNOSIS — F1210 Cannabis abuse, uncomplicated: Secondary | ICD-10-CM

## 2018-07-24 DIAGNOSIS — F319 Bipolar disorder, unspecified: Secondary | ICD-10-CM

## 2018-07-24 MED ORDER — QUETIAPINE FUMARATE 100 MG PO TABS: 100.0000 mg | ORAL_TABLET | Freq: Every day | ORAL | 1 refills | Status: DC

## 2018-07-24 MED ORDER — LAMOTRIGINE 100 MG PO TABS: 100.0000 mg | ORAL_TABLET | Freq: Every day | ORAL | 1 refills | Status: DC

## 2018-07-24 MED FILL — SUBVENITE 100 MG TABS: 100 | 30 days supply | Qty: 30 | Fill #0

## 2018-07-24 MED FILL — QUETIAPINE FUMARATE 100 MG: 100 | 30 days supply | Qty: 30 | Fill #0

## 2018-07-24 NOTE — Progress Notes (Signed)
[facility] MD/PA/NP OP Progress Note  07/24/2018 10:40 AM Steven Herrera  MRN:  [id number]  Chief Complaint: I like new medication.  I am not taking Klonopin anymore.  HPI: Came for his follow appointment.  He is a 48 year old Caucasian male who was seen first time on December 9.  He was referred from primary care physician for the management of his psychiatric illness.  Patient is struggle with his mood swings, irritability, highs and lows, anxiety poor sleep and diagnosed with PTSD and bipolar disorder.  He also had a history of drug use most recently cannabis and alcohol use.  He is proud that he has been out of her Klonopin with slow titration to wean himself off.  He admitted in the beginning he had a lot of emotions and had nervousness but with his willpower he was able to manage it.  He forgot to take Lamictal twice a day.  But he believe the new medicine Seroquel and Lamictal helping his mood and irritability.  He has more energy in the morning.  In the beginning he was sleeping very well with the Seroquel but now he feels medicine is wearing off.  He also cut down his drinking significantly.  He used to drink sixpacks every night but now he only drink 2-3 beers 12 ounces mostly on the weekends.  He has not smoked marijuana since the last visit.  He has no rash, itching, tremors or shakes.  He is no longer taking Paxil due to sexual side effects.  Now he wants to come off from Wellbutrin.  He told that Wellbutrin was added to help the sexual side effects but he had noticed increased emotions with the Wellbutrin.  Patient denies any paranoia, hallucination, suicidal thoughts.  His energy level is improved.  His mood swings are less intense and less frequent.  He is more active during the day.  He lost few pounds since the last visit.  He started seeing Steven Herrera for therapy.  He lives with his wife who is been married for 30 years.  He is a self-employed and he has own tattoo shop.  Visit Diagnosis:     ICD-10-CM   1. Bipolar I disorder (HCC) F31.9 lamoTRIgine (LAMICTAL) 100 MG tablet    QUEtiapine (SEROQUEL) 100 MG tablet  2. Mild tetrahydrocannabinol (THC) abuse F12.10 lamoTRIgine (LAMICTAL) 100 MG tablet    Past Psychiatric History: Reviewed. History of briefly seeing therapist at [place] and his young age.  History of mood swing, anger, agitation, hyperactivity and running away.  Used acid, LSD, cocaine, marijuana and heavy drinking.  Tried Zoloft, Lexapro and Xanax did not like it.  Took Paxil and Klonopin and Wellbutrin from PCP.  Past Medical History:  Past Medical History:  Diagnosis Date  . Depression   . High cholesterol   . PFO (patent foramen ovale)    a. s/p PFO closure by Dr. Burt Knack on 11/19/17  . Stroke [facility])     Past Surgical History:  Procedure Laterality Date  . CARPAL TUNNEL RELEASE    . LOOP RECORDER INSERTION N/A 09/25/2017   Procedure: LOOP RECORDER INSERTION;  Surgeon: [name], MD;  Location: [facility];  Service: Cardiovascular;  Laterality: N/A;  . MOUTH SURGERY    . PATENT FORAMEN OVALE(PFO) CLOSURE N/A 11/19/2017   Procedure: PATENT FORAMEN OVALE (PFO) CLOSURE;  Surgeon: [name], MD;  Location: [facility];  Service: Cardiovascular;  Laterality: N/A;  . spider bite    .  TEE WITHOUT CARDIOVERSION N/A 09/25/2017   Procedure: TRANSESOPHAGEAL ECHOCARDIOGRAM (TEE) WITH LOOP;  Surgeon: Pixie Casino, MD;  Location: [facility];  Service: Cardiovascular;  Laterality: N/A;  . VASECTOMY      Family Psychiatric History: Viewed.  Family History:  Family History  Problem Relation Age of Onset  . Stroke Father        disabling CVA in early-mid 50's  . Hypertension Father   . Alcohol abuse Father   . Heart disease Mother   . Dementia Mother   . Atrial fibrillation Mother   . Drug abuse Sister   . Hypertension Brother   . Healthy Sister     Social History:  Social History   Socioeconomic History  .  Marital status: Married    Spouse name: Not on file  . Number of children: 2  . Years of education: Not on file  . Highest education level: 8th grade  Occupational History  . Not on file  Social Needs  . Financial resource strain: Somewhat hard  . Food insecurity:    Worry: Never true    Inability: Never true  . Transportation needs:    Medical: No    Non-medical: No  Tobacco Use  . Smoking status: Former Smoker    Packs/day: 0.25    Years: 28.00    Pack years: 7.00    Types: Cigarettes    Last attempt to quit: 07/25/2017    Years since quitting: 0.9  . Smokeless tobacco: Never Used  Substance and Sexual Activity  . Alcohol use: Yes    Alcohol/week: 6.0 standard drinks    Types: 6 Cans of beer per week    Comment: 6 pack nightly  . Drug use: Yes    Types: Marijuana    Comment: takes daily for pain leisure   . Sexual activity: Yes    Birth control/protection: Surgical  Lifestyle  . Physical activity:    Days per week: 2 days    Minutes per session: 30 min  . Stress: To some extent  Relationships  . Social connections:    Talks on phone: More than three times a week    Gets together: More than three times a week    Attends religious service: Never    Active member of club or organization: No    Attends meetings of clubs or organizations: Never    Relationship status: Married  Other Topics Concern  . Not on file  Social History Narrative  . Not on file    Allergies:  Allergies  Allergen Reactions  . Ibuprofen Other (See Comments)    Bloody stools  . Penicillins Hives    Has patient had a PCN reaction causing immediate rash, facial/tongue/throat swelling, SOB or lightheadedness with hypotension: No Has patient had a PCN reaction causing severe rash involving mucus membranes or skin necrosis: Yes Has patient had a PCN reaction that required hospitalization: no Has patient had a PCN reaction occurring within the last 10 years: no If all of the above answers are  "NO", then may proceed with Cephalosporin use.    Metabolic Disorder Labs: Lab Results  Component Value Date   HGBA1C 5.1 06/27/2017   MPG 99.67 06/27/2017   No results found for: PROLACTIN Lab Results  Component Value Date   CHOL 136 02/09/2018   TRIG 316 (H) 02/09/2018   HDL 34 (L) 02/09/2018   CHOLHDL 4.0 02/09/2018   VLDL UNABLE TO CALCULATE IF TRIGLYCERIDE OVER 400 mg/dL 06/27/2017  [place] 39 02/09/2018   [place] Comment 10/27/2017   Lab Results  Component Value Date   TSH 1.830 08/25/2017    Therapeutic Level Labs: No results found for: LITHIUM No results found for: VALPROATE No components found for:  CBMZ  Current Medications: Current Outpatient Medications  Medication Sig Dispense Refill  . aspirin EC 81 MG tablet Take 81 mg by mouth daily with lunch.     Marland Kitchen atorvastatin (LIPITOR) 20 MG tablet TAKE 1 TABLET BY MOUTH ONCE DAILY AT 6 PM (Patient taking differently: Take 20 mg by mouth daily with lunch. ) 90 tablet 1  . buPROPion (WELLBUTRIN XL) 150 MG 24 hr tablet TAKE 1 TABLET BY MOUTH ONCE DAILY 90 tablet 0  . clonazePAM (KLONOPIN) 1 MG tablet TAKE 2 TABLETS (2 MG TOTAL) BY MOUTH DAILY WITH LUNCH. NO ADDITIONAL REFILLS WILL BE PROVIDED 60 tablet 0  . clopidogrel (PLAVIX) 75 MG tablet Take 1 tablet (75 mg total) by mouth daily. (Patient taking differently: Take 75 mg by mouth daily with lunch. ) 90 tablet 3  . lamoTRIgine (LAMICTAL) 25 MG tablet Take one tab daily for 1 week and than 2 tab daily 60 tablet 1  . Menthol, Topical Analgesic, (ICY HOT EX) Apply 1 application topically 3 (three) times daily as needed (pain).     . Multiple Vitamin (MULTIVITAMIN WITH MINERALS) TABS tablet Take 1 tablet by mouth daily with lunch.    . naproxen sodium (ALEVE) 220 MG tablet Take 220-440 mg by mouth 3 (three) times daily as needed (pain/headaches).    Marland Kitchen omega-3 acid ethyl esters (LOVAZA) 1 g capsule Take 2 capsules (2 g total) by mouth 2 (two) times daily. 240 capsule 2  .  PARoxetine (PAXIL) 40 MG tablet TAKE 1 TABLET BY MOUTH ONCE DAILY 90 tablet 0  . QUEtiapine (SEROQUEL) 50 MG tablet Take one to two tab at bed time 60 tablet 1   No current facility-administered medications for this visit.      Musculoskeletal: Strength & Muscle Tone: within normal limits Gait & Station: normal Patient leans: N/A  Psychiatric Specialty Exam: Review of Systems  Constitutional: Positive for weight loss.  Skin: Negative.  Negative for itching and rash.       tattoo in both arm  Psychiatric/Behavioral: The patient has insomnia.     Blood pressure 127/84, pulse 84, height 6' (1.829 m), weight 211 lb (95.7 kg), SpO2 98 %.There is no height or weight on file to calculate BMI.  General Appearance: Casual and Tattoos in his both arms  Eye Contact:  Good  Speech:  Clear and Coherent  Volume:  Normal  Mood:  Pleasant  Affect:  Congruent  Thought Process:  Descriptions of Associations: Intact  Orientation:  Full (Time, Place, and Person)  Thought Content: Rumination   Suicidal Thoughts:  No  Homicidal Thoughts:  No  Memory:  Immediate;   Good Recent;   Good Remote;   Good  Judgement:  Good  Insight:  Good  Psychomotor Activity:  Normal  Concentration:  Concentration: Fair and Attention Span: Fair  Recall:  [place] of Knowledge: Good  Language: Good  Akathisia:  No  Handed:  Right  AIMS (if indicated): not done  Assets:  Communication Skills Desire for [place] Talents/Skills Transportation  ADL's:  Intact  Cognition: WNL  Sleep:  Fair   Screenings: PHQ2-9     Office Visit from 02/09/2018 in [place] at [facility] Visit  from 08/25/2017 in [place] at [facility] Total Score  4  2  PHQ-9 Total Score  15  12       Assessment and Plan: Disorder type I.  Posttraumatic stress disorder by history.  Alcohol abuse.  Cannabis abuse in early remission.  I reviewed his  symptoms and current medication.  Patient is no longer taking Paxil and Klonopin.  He admitted having withdrawal symptoms because he noticed mood is roller coaster but now he feels the medicine helping him.  We discussed that he need to take the Lamictal as prescribed.  He has no rash, itching tremors or shakes.  Recommended to try higher dose of Seroquel and Lamictal which he is tolerating well.  Increase Seroquel to 100 mg at bedtime as he was only taking 50 mg.  Increase Lamictal to 50 mg for 1 week and then 75 mg next week and 100 mg daily.  Is only taking 25 mg.  Discussed that increasing Lamictal and Seroquel will help his residual mood lability and insomnia.  He like to come off from Wellbutrin and explain stopping the Wellbutrin may cause attention and concentration issues but he did not recall Wellbutrin helped his attention concentration and actually make him more emotional.  We will discontinue Wellbutrin.  Encouraged to see Steven Herrera on a regular basis for coping skills.  He had cut down his drinking a lot and he is no longer taking cannabis.  Discussed in length medication side effects and benefits.  He had given ample amount of time to ask any question.  I will see him again in 2 months.  Time spent 30 minutes.  More than 50% of the time spent in psychoeducation, counseling and coordination of care.  Discuss safety plan that anytime having active suicidal thoughts or homicidal thoughts then patient need to call 911 or go to the local emergency room.     Kathlee Nations, MD 07/24/2018, 10:40 AM

## 2018-07-27 ENCOUNTER — Ambulatory Visit (INDEPENDENT_AMBULATORY_CARE_PROVIDER_SITE_OTHER): Payer: 59 | Admitting: Licensed Clinical Social Worker

## 2018-07-27 ENCOUNTER — Encounter (HOSPITAL_COMMUNITY): Payer: Self-pay | Admitting: Licensed Clinical Social Worker

## 2018-07-27 DIAGNOSIS — F1010 Alcohol abuse, uncomplicated: Secondary | ICD-10-CM | POA: Diagnosis not present

## 2018-07-27 DIAGNOSIS — F319 Bipolar disorder, unspecified: Secondary | ICD-10-CM | POA: Diagnosis not present

## 2018-07-27 NOTE — Progress Notes (Signed)
   THERAPIST PROGRESS NOTE  Session Time: 11:10-12:00pm  Participation Level: Active  Behavioral Response: CasualAlertDepressed  Type of Therapy: Individual Therapy  Treatment Goals addressed: Improve psychiatric symptoms, Controlled Behavior, Moderated Mood, Improve Unhelpful Thought Patterns, Emotional Regulation Skills (Moderate moods, anger management, stress management), Feel and express a full Range of Emotions, Learn about Diagnosis, Healthy Coping Skills, Recall the Traumatic event without being overwhelmed.   Interventions: Supportive  Summary: Steven Herrera is a 48 y.o. male who presents for his initial individual counseling session. Spent a considerable amount of time building a trusting therapeutic relationship. Pt presents tearful today. "It's my wellbutrin causing this." Pt is titration down his wellbutrin. He has completely stopped his Klonopin. He saw Dr. Adele Schilder Friday who increased his Lamictal and Seroquel. He reports he feels the new medicines are helping his moods and sleep patterns. 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 Pt has no free time, barely has time to eat, much less have any free time. Discussed "change" with pt. He does not feel he can change anything because he has so much responsibility. Pt continues to drink a few beers a few times a week, on the weekend he drinks more. Pt reports he hasn't used marijuana in 2 weeks. Discussed with pt the affects on his meds by continuing to use. Pt reports he does have some time on Sundays to go out on his property to the "shooting range." Pt experienced a stroke last year. Asked  Open ended questions about the residuals of the stroke - memory, eye sight failing. Asked open ended questions about the marijuana affecting his memory. Educated pt on compartmentalization, which may assist with his feelings of being overwhelmed.     Suicidal/Homicidal:  Nowithout intent/plan  Therapist Response: Evaluated pt's current functioning and reviewed progress. Assisted pt building a trusting therapeutic relationship and gaining background information. Assisted pt processing for the management of his stressors.  Plan: Return again in 2weeks.  Diagnosis: Axis I: Bipolar 1 disorder, Alcohol abuse        [place], LCAS 07/27/2018

## 2018-08-03 ENCOUNTER — Other Ambulatory Visit: Payer: Self-pay

## 2018-08-03 ENCOUNTER — Other Ambulatory Visit: Payer: 59

## 2018-08-03 DIAGNOSIS — E7800 Pure hypercholesterolemia, unspecified: Secondary | ICD-10-CM

## 2018-08-03 DIAGNOSIS — Z79899 Other long term (current) drug therapy: Secondary | ICD-10-CM

## 2018-08-03 DIAGNOSIS — E559 Vitamin D deficiency, unspecified: Secondary | ICD-10-CM

## 2018-08-03 DIAGNOSIS — Z0000 Encounter for general adult medical examination without abnormal findings: Secondary | ICD-10-CM

## 2018-08-04 NOTE — Progress Notes (Signed)
Subjective:    Patient ID: Steven Herrera, male    DOB: [birth year], 48 y.o.   MRN: [id number]  HPI:  Steven Herrera is here for CPE He reports medication compliance, denies SE He estimates to drink  >60 oz water a day and has been trying to limit saturated fat in his diet He remains active on "on my [place] acres at home" He continues to abstain from tobacco and has dramatically reduced marijuana use- once/month He has finally established with Psychiatrist- medications adjusted and followed monthly currently. He has seen therapist a few times, but "isn't jiving with them". He denies thoughts of harming himself/others  Fasting labs needed- advised to schedule appt ASAP  Healthcare Maintenance: Immunizations-TdaP updated today  Patient Care Team    Relationship Specialty Notifications Start End  Steven Grandchild, NP PCP - General Family Medicine  07/11/17   Steven Fila, MD Consulting Physician Neurology  07/11/17   [facility]    08/25/17     Patient Active Problem List   Diagnosis Date Noted  . Vitamin D deficiency 06/01/2018  . High cholesterol 02/09/2018  . 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 On statin therapy 08/25/2017  . Healthcare maintenance 07/11/2017  . Depression with anxiety 06/27/2017  . Cerebrovascular accident (CVA) ([place]) 06/27/2017  . Acute ischemic stroke ([place]) 06/26/2017  . Elevated blood-pressure reading without diagnosis of hypertension 06/26/2017  . NEVUS 02/13/2007     Past Medical History:  Diagnosis Date  . Depression   . High cholesterol   . PFO (patent foramen ovale)    a. s/p PFO closure by Dr. Burt Herrera on 11/19/17  . Stroke [facility])      Past Surgical History:  Procedure Laterality Date  . big toe amputation Right   . CARPAL TUNNEL RELEASE    . LOOP RECORDER INSERTION N/A 09/25/2017   Procedure: LOOP RECORDER INSERTION;  Surgeon: Steven Grayer, MD;  Location: [facility];  Service: Cardiovascular;  Laterality:  N/A;  . MOUTH SURGERY    . PATENT FORAMEN OVALE(PFO) CLOSURE N/A 11/19/2017   Procedure: PATENT FORAMEN OVALE (PFO) CLOSURE;  Surgeon: Steven Mocha, MD;  Location: [facility];  Service: Cardiovascular;  Laterality: N/A;  . spider bite    . TEE WITHOUT CARDIOVERSION N/A 09/25/2017   Procedure: TRANSESOPHAGEAL ECHOCARDIOGRAM (TEE) WITH LOOP;  Surgeon: Steven Casino, MD;  Location: [facility] ENDOSCOPY;  Service: Cardiovascular;  Laterality: N/A;  . VASECTOMY       Family History  Problem Relation Age of Onset  . Stroke Father        disabling CVA in early-mid 50's  . Hypertension Father   . Alcohol abuse Father   . Heart disease Mother   . Dementia Mother   . Atrial fibrillation Mother   . Drug abuse Sister   . Hypertension Brother   . Healthy Sister      Social History   Substance and Sexual Activity  Drug Use Yes  . Types: Marijuana   Comment: takes daily for pain leisure      Social History   Substance and Sexual Activity  Alcohol Use Yes  . Alcohol/week: 6.0 standard drinks  . Types: 6 Cans of beer per week   Comment: 6 pack nightly     Social History   Tobacco Use  Smoking Status Former Smoker  . Packs/day: 0.25  . Years: 28.00  . Pack years: 7.00  . Types: Cigarettes  .  Last attempt to quit: 07/25/2017  . Years since quitting: 1.0  Smokeless Tobacco Never Used     Outpatient Encounter Medications as of 08/10/2018  Medication Sig  . aspirin EC 81 MG tablet Take 81 mg by mouth daily with lunch.   Marland Kitchen atorvastatin (LIPITOR) 20 MG tablet TAKE 1 TABLET BY MOUTH ONCE DAILY AT 6 PM (Patient taking differently: Take 20 mg by mouth daily with lunch. )  . clopidogrel (PLAVIX) 75 MG tablet Take 1 tablet (75 mg total) by mouth daily.  Marland Kitchen lamoTRIgine (LAMICTAL) 100 MG tablet Take 1 tablet (100 mg total) by mouth daily.  . Menthol, Topical Analgesic, (ICY HOT EX) Apply 1 application topically 3 (three) times daily as needed (pain).   . Multiple Vitamin  (MULTIVITAMIN WITH MINERALS) TABS tablet Take 1 tablet by mouth daily with lunch.  . naproxen sodium (ALEVE) 220 MG tablet Take 220-440 mg by mouth 3 (three) times daily as needed (pain/headaches).  Marland Kitchen omega-3 acid ethyl esters (LOVAZA) 1 g capsule Take 2 capsules (2 g total) by mouth 2 (two) times daily.  . QUEtiapine (SEROQUEL) 100 MG tablet Take 1 tablet (100 mg total) by mouth at bedtime.  . [DISCONTINUED] buPROPion (WELLBUTRIN XL) 150 MG 24 hr tablet TAKE 1 TABLET BY MOUTH ONCE DAILY   No facility-administered encounter medications on file as of 08/10/2018.     Allergies: Ibuprofen and Penicillins  Body mass index is 28.74 kg/m.  Blood pressure 119/79, pulse 73, temperature 98.2 F (36.8 C), temperature source Oral, height 6' (1.829 m), weight 211 lb 14.4 oz (96.1 kg), SpO2 100 %.  Review of Systems  Constitutional: Positive for fatigue. Negative for activity change, appetite change, chills, diaphoresis, fever and unexpected weight change.  HENT: Negative for congestion.   Eyes: Negative for visual disturbance.  Respiratory: Negative for cough, chest tightness, shortness of breath, wheezing and stridor.   Cardiovascular: Negative for chest pain, palpitations and leg swelling.  Gastrointestinal: Negative for abdominal distention, anal bleeding, blood in stool, constipation, diarrhea, nausea and vomiting.  Endocrine: Negative for cold intolerance, heat intolerance, polydipsia and polyuria.  Genitourinary: Negative for difficulty urinating and flank pain.  Musculoskeletal: Negative for arthralgias, back pain, gait problem, joint swelling, myalgias, neck pain and neck stiffness.  Skin: Negative for color change, pallor, rash and wound.  Neurological: Negative for dizziness and headaches.  Hematological: Does not bruise/bleed easily.  Psychiatric/Behavioral: Negative for agitation, behavioral problems, confusion, decreased concentration, dysphoric mood, hallucinations, self-injury, sleep  disturbance and suicidal ideas. The patient is not nervous/anxious and is not hyperactive.        Objective:   Physical Exam Vitals signs and nursing note reviewed.  Constitutional:      General: He is not in acute distress.    Appearance: Normal appearance. He is not ill-appearing, toxic-appearing or diaphoretic.  HENT:     Head: Normocephalic and atraumatic.     Right Ear: Tympanic membrane, ear canal and external ear normal. There is no impacted cerumen.     Left Ear: Tympanic membrane, ear canal and external ear normal. There is no impacted cerumen.     Nose: Nose normal. No congestion or rhinorrhea.     Mouth/Throat:     Mouth: Mucous membranes are moist.     Pharynx: Oropharynx is clear. No oropharyngeal exudate or posterior oropharyngeal erythema.  Eyes:     Extraocular Movements: Extraocular movements intact.     Conjunctiva/sclera: Conjunctivae normal.     Pupils: Pupils are equal, round, and reactive  to light.  Neck:     Musculoskeletal: Normal range of motion.  Cardiovascular:     Rate and Rhythm: Normal rate.     Pulses: Normal pulses.     Heart sounds: Normal heart sounds. No murmur. No friction rub. No gallop.   Pulmonary:     Effort: Pulmonary effort is normal. No respiratory distress.     Breath sounds: No wheezing, rhonchi or rales.  Chest:     Chest wall: No tenderness.  Abdominal:     General: Bowel sounds are normal. There is no distension.     Palpations: There is no mass.     Tenderness: There is no abdominal tenderness. There is no right CVA tenderness, left CVA tenderness, guarding or rebound.     Hernia: No hernia is present.  Genitourinary:    Comments: Declined DRE./gential examination  Musculoskeletal: Normal range of motion.  Skin:    General: Skin is warm.     Capillary Refill: Capillary refill takes less than 2 seconds.  Neurological:     Mental Status: He is alert and oriented to person, place, and time.  Psychiatric:        Mood and  Affect: Mood normal.        Behavior: Behavior normal.        Thought Content: Thought content normal.        Judgment: Judgment normal.       Assessment & Plan:   1. High cholesterol   2. Healthcare maintenance   3. On statin therapy   4. Vitamin D deficiency   5. Need for Tdap vaccination     Healthcare maintenance Continue all medications as directed. Remain well hydrated and follow a heart healthy diet. Please keep alcohol to minimum- no more than 2 drinks/day. Continue to abstain from tobacco. Continue with Psychiatrist, Cardiologist, and Neurologist as directed. Please schedule fasting lab appt in the next 1-2 weeks. Follow-up in 6 months, sooner if needed.   High cholesterol Need fasting labs Currently on Atorvastatin 20mg  QD    FOLLOW-UP:  Return in about 6 months (around 02/08/2019) for Regular Follow Up.

## 2018-08-10 ENCOUNTER — Encounter: Payer: Self-pay | Admitting: Adult Health

## 2018-08-10 ENCOUNTER — Ambulatory Visit (INDEPENDENT_AMBULATORY_CARE_PROVIDER_SITE_OTHER): Payer: 59 | Admitting: Adult Health

## 2018-08-10 VITALS — BP 119/79 | HR 73 | Temp 98.2°F | Ht 72.0 in | Wt 211.9 lb

## 2018-08-10 DIAGNOSIS — E7800 Pure hypercholesterolemia, unspecified: Secondary | ICD-10-CM | POA: Diagnosis not present

## 2018-08-10 DIAGNOSIS — Z0000 Encounter for general adult medical examination without abnormal findings: Secondary | ICD-10-CM | POA: Diagnosis not present

## 2018-08-10 DIAGNOSIS — E559 Vitamin D deficiency, unspecified: Secondary | ICD-10-CM | POA: Diagnosis not present

## 2018-08-10 DIAGNOSIS — Z23 Encounter for immunization: Secondary | ICD-10-CM

## 2018-08-10 DIAGNOSIS — Z79899 Other long term (current) drug therapy: Secondary | ICD-10-CM

## 2018-08-10 NOTE — Patient Instructions (Addendum)

## 2018-08-10 NOTE — Assessment & Plan Note (Signed)
Need fasting labs Currently on Atorvastatin 20mg  QD

## 2018-08-10 NOTE — Assessment & Plan Note (Signed)
Continue all medications as directed. Remain well hydrated and follow a heart healthy diet. Please keep alcohol to minimum- no more than 2 drinks/day. Continue to abstain from tobacco. Continue with Psychiatrist, Cardiologist, and Neurologist as directed. Please schedule fasting lab appt in the next 1-2 weeks. Follow-up in 6 months, sooner if needed.

## 2018-08-17 ENCOUNTER — Other Ambulatory Visit (INDEPENDENT_AMBULATORY_CARE_PROVIDER_SITE_OTHER): Payer: 59

## 2018-08-17 DIAGNOSIS — E7800 Pure hypercholesterolemia, unspecified: Secondary | ICD-10-CM | POA: Diagnosis not present

## 2018-08-17 DIAGNOSIS — E559 Vitamin D deficiency, unspecified: Secondary | ICD-10-CM | POA: Diagnosis not present

## 2018-08-17 DIAGNOSIS — Z79899 Other long term (current) drug therapy: Secondary | ICD-10-CM

## 2018-08-17 DIAGNOSIS — Z0000 Encounter for general adult medical examination without abnormal findings: Secondary | ICD-10-CM | POA: Diagnosis not present

## 2018-08-18 ENCOUNTER — Encounter: Payer: Self-pay | Admitting: Adult Health

## 2018-08-18 ENCOUNTER — Other Ambulatory Visit: Payer: Self-pay | Admitting: Adult Health

## 2018-08-18 ENCOUNTER — Ambulatory Visit (INDEPENDENT_AMBULATORY_CARE_PROVIDER_SITE_OTHER): Payer: 59 | Admitting: *Deleted

## 2018-08-18 DIAGNOSIS — E7800 Pure hypercholesterolemia, unspecified: Secondary | ICD-10-CM

## 2018-08-18 DIAGNOSIS — I639 Cerebral infarction, unspecified: Secondary | ICD-10-CM

## 2018-08-18 DIAGNOSIS — E559 Vitamin D deficiency, unspecified: Secondary | ICD-10-CM

## 2018-08-18 LAB — CBC WITH DIFFERENTIAL/PLATELET
BASOS ABS: 0 10*3/uL (ref 0.0–0.2)
Basos: 1 %
EOS (ABSOLUTE): 0.2 10*3/uL (ref 0.0–0.4)
Eos: 3 %
Hematocrit: 40.6 % (ref 37.5–51.0)
Hemoglobin: 14.5 g/dL (ref 13.0–17.7)
Immature Grans (Abs): 0 10*3/uL (ref 0.0–0.1)
Immature Granulocytes: 0 %
LYMPHS ABS: 2.2 10*3/uL (ref 0.7–3.1)
LYMPHS: 37 %
MCH: 31.9 pg (ref 26.6–33.0)
MCHC: 35.7 g/dL (ref 31.5–35.7)
MCV: 89 fL (ref 79–97)
Monocytes Absolute: 0.7 10*3/uL (ref 0.1–0.9)
Monocytes: 12 %
NEUTROS ABS: 2.8 10*3/uL (ref 1.4–7.0)
Neutrophils: 47 %
PLATELETS: 223 10*3/uL (ref 150–450)
RBC: 4.54 x10E6/uL (ref 4.14–5.80)
RDW: 12.3 % (ref 11.6–15.4)
WBC: 6 10*3/uL (ref 3.4–10.8)

## 2018-08-18 LAB — COMPREHENSIVE METABOLIC PANEL
ALBUMIN: 4 g/dL (ref 4.0–5.0)
ALT: 56 IU/L — AB (ref 0–44)
AST: 41 IU/L — ABNORMAL HIGH (ref 0–40)
Albumin/Globulin Ratio: 1.5 (ref 1.2–2.2)
Alkaline Phosphatase: 102 IU/L (ref 39–117)
BUN / CREAT RATIO: 13 (ref 9–20)
BUN: 13 mg/dL (ref 6–24)
CHLORIDE: 103 mmol/L (ref 96–106)
CO2: 21 mmol/L (ref 20–29)
Calcium: 9.1 mg/dL (ref 8.7–10.2)
Creatinine, Ser: 1.03 mg/dL (ref 0.76–1.27)
GFR calc non Af Amer: 87 mL/min/{1.73_m2} (ref >59)
GFR, EST AFRICAN AMERICAN: 100 mL/min/{1.73_m2} (ref >59)
GLOBULIN, TOTAL: 2.6 g/dL (ref 1.5–4.5)
Glucose: 114 mg/dL — ABNORMAL HIGH (ref 65–99)
POTASSIUM: 4.2 mmol/L (ref 3.5–5.2)
Sodium: 140 mmol/L (ref 134–144)
Total Protein: 6.6 g/dL (ref 6.0–8.5)

## 2018-08-18 LAB — HEMOGLOBIN A1C
Est. average glucose Bld gHb Est-mCnc: 108 mg/dL
Hgb A1c MFr Bld: 5.4 % (ref 4.8–5.6)

## 2018-08-18 LAB — LIPID PANEL
CHOL/HDL RATIO: 9.3 ratio — AB (ref 0.0–5.0)
Cholesterol, Total: 177 mg/dL (ref 100–199)
HDL: 19 mg/dL — AB (ref >39)
TRIGLYCERIDES: 1350 mg/dL — AB (ref 0–149)

## 2018-08-18 LAB — TSH: TSH: 1.27 u[IU]/mL (ref 0.450–4.500)

## 2018-08-18 LAB — VITAMIN D 25 HYDROXY (VIT D DEFICIENCY, FRACTURES): Vit D, 25-Hydroxy: 15.9 ng/mL — ABNORMAL LOW (ref 30.0–100.0)

## 2018-08-18 MED ORDER — VITAMIN D (ERGOCALCIFEROL) 1.25 MG (50000 UNIT) PO CAPS: 50000.0000 [IU] | ORAL_CAPSULE | ORAL | 0 refills | Status: DC

## 2018-08-18 MED ORDER — ATORVASTATIN CALCIUM 40 MG PO TABS: ORAL_TABLET | ORAL | 0 refills | Status: DC

## 2018-08-18 MED FILL — ATORVASTATIN 40 MG TABLET: 40 | 90 days supply | Qty: 90 | Fill #0

## 2018-08-18 MED FILL — VIT D2 1.25 MG (50,000 UNIT: 1.25 MG | 84 days supply | Qty: 12 | Fill #0

## 2018-08-19 LAB — CUP PACEART REMOTE DEVICE CHECK
Date Time Interrogation Session: 20200225230653
MDC IDC PG IMPLANT DT: 20190404

## 2018-08-24 ENCOUNTER — Ambulatory Visit (HOSPITAL_COMMUNITY): Payer: 59 | Admitting: Licensed Clinical Social Worker

## 2018-08-25 NOTE — Progress Notes (Signed)
Carelink Summary Report / Loop Recorder 

## 2018-08-26 MED FILL — SUBVENITE 100 MG TABS: 100 | 30 days supply | Qty: 30 | Fill #1

## 2018-08-26 MED FILL — QUETIAPINE FUMARATE 100 MG: 100 | 30 days supply | Qty: 30 | Fill #1

## 2018-09-21 ENCOUNTER — Other Ambulatory Visit: Payer: Self-pay

## 2018-09-21 ENCOUNTER — Telehealth (INDEPENDENT_AMBULATORY_CARE_PROVIDER_SITE_OTHER): Payer: 59 | Admitting: Psychiatry

## 2018-09-21 ENCOUNTER — Ambulatory Visit (INDEPENDENT_AMBULATORY_CARE_PROVIDER_SITE_OTHER): Payer: 59 | Admitting: *Deleted

## 2018-09-21 DIAGNOSIS — F319 Bipolar disorder, unspecified: Secondary | ICD-10-CM

## 2018-09-21 DIAGNOSIS — F1210 Cannabis abuse, uncomplicated: Secondary | ICD-10-CM | POA: Diagnosis not present

## 2018-09-21 DIAGNOSIS — I639 Cerebral infarction, unspecified: Secondary | ICD-10-CM

## 2018-09-21 LAB — CUP PACEART REMOTE DEVICE CHECK
Date Time Interrogation Session: 20200329234224
MDC IDC PG IMPLANT DT: 20190404

## 2018-09-21 MED ORDER — LAMOTRIGINE 100 MG PO TABS: 100.0000 mg | ORAL_TABLET | Freq: Every day | ORAL | 0 refills | Status: DC

## 2018-09-21 MED ORDER — QUETIAPINE FUMARATE 100 MG PO TABS: 100.0000 mg | ORAL_TABLET | Freq: Every day | ORAL | 0 refills | Status: DC

## 2018-09-21 MED FILL — SUBVENITE 100 MG TABS: 100 | 90 days supply | Qty: 90 | Fill #0

## 2018-09-21 NOTE — Progress Notes (Signed)
Virtual Visit via Telephone Note  I connected with Steven Herrera on 09/21/18 at  3:00 PM EDT by telephone and verified that I am speaking with the correct person using two identifiers.   I discussed the limitations, risks, security and privacy concerns of performing an evaluation and management service by telephone and the availability of in person appointments. I also discussed with the patient that there may be a patient responsible charge related to this service. The patient expressed understanding and agreed to proceed.   History of Present Illness: Patient was evaluated through phone.  He is taking Seroquel 100 mg and Lamictal 100 mg every day.  He is sleeping better and he denies any irritability, anger or any nightmares.  He is pleased that he stop smoking marijuana and he cut down his drinking.  He is currently in quarantine because he cannot work.  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  He reported no tremors, shakes, rash or any itching.  He lives with his wife who is supportive.  He like to increase Lamictal which is helping his mood.  He is not seeing Beth because he does not feel he needed.  He is able to control his anger.  He admitted weight gain but we do not have vitals today.  However he mentioned that he is trying to watch his caloric intake and doing best at his level while staying home to do exercise.  He is hoping once social distancing and he will resume his regular exercise and outside walking.  Like to continue his current medication.  Recently he had blood work and he has mild elevation on AST and ALT.  His blood sugar was also slightly increased.  Past Psychiatric History: Reviewed. History of briefly seeing therapist at [facility] and his young age.  History of mood swing, anger, agitation, hyperactivity and running away.  Used acid, LSD, cocaine, marijuana and heavy drinking.  Tried Zoloft, Lexapro and  Xanax did not like it.  Took Paxil and Klonopin and Wellbutrin from PCP.  Recent Results (from the past 2160 hour(s))  CUP PACEART REMOTE DEVICE CHECK     Status: None   Collection Time: 07/16/18 11:09 PM  Result Value Ref Range   Date Time Interrogation Session [id number]    Pulse Generator Manufacturer MERM    Pulse Gen Model [id number] Reveal LINQ    Pulse Gen Serial Number [id number]    Clinic Name [facility]    Implantable Pulse Generator Type ICM/ILR    Implantable Pulse Generator Implant Date [id number]    Eval Rhythm NSR   VITAMIN D 25 Hydroxy (Vit-D Deficiency, Fractures)     Status: Abnormal   Collection Time: 08/17/18 10:58 AM  Result Value Ref Range   Vit D, 25-Hydroxy 15.9 (L) 30.0 - 100.0 ng/mL    Comment: Vitamin D deficiency has been defined by the Institute of Medicine and an Endocrine Society practice guideline as a level of serum 25-OH vitamin D less than 20 ng/mL (1,2). The Endocrine Society went on to further define vitamin D insufficiency as a level between 21 and 29 ng/mL (2). 1. IOM (Institute of Medicine). 2010. Dietary reference    intakes for calcium and D. [place]: The    [employer]. 2. Holick MF, [place], Bischoff-Ferrari HA, et al.    Evaluation, treatment, and prevention of vitamin D    deficiency: an Endocrine Society clinical practice  guideline. JCEM. 2011 Jul; 96(7):1911-30.   TSH     Status: None   Collection Time: 08/17/18 10:58 AM  Result Value Ref Range   TSH 1.270 0.450 - 4.500 uIU/mL  Lipid panel     Status: Abnormal   Collection Time: 08/17/18 10:58 AM  Result Value Ref Range   Cholesterol, Total 177 100 - 199 mg/dL   Triglycerides 1,350 (HH) 0 - 149 mg/dL    Comment: Results confirmed on dilution.    HDL 19 (L) >39 mg/dL   VLDL Cholesterol Cal Comment 5 - 40 mg/dL    Comment: The calculation for the VLDL cholesterol is not valid when triglyceride level is >400 mg/dL.    LDL Calculated Comment 0 -  99 mg/dL    Comment: Triglyceride result indicated is too high for an accurate LDL cholesterol estimation.    Chol/HDL Ratio 9.3 (H) 0.0 - 5.0 ratio    Comment:                                   T. Chol/HDL Ratio                                             Men  Women                               1/2 Avg.Risk  3.4    3.3                                   Avg.Risk  5.0    4.4                                2X Avg.Risk  9.6    7.1                                3X Avg.Risk 23.4   11.0   Hemoglobin A1c     Status: None   Collection Time: 08/17/18 10:58 AM  Result Value Ref Range   Hgb A1c MFr Bld 5.4 4.8 - 5.6 %    Comment:          Prediabetes: 5.7 - 6.4          Diabetes: >6.4          Glycemic control for adults with diabetes: <7.0    Est. average glucose Bld gHb Est-mCnc 108 mg/dL  Comprehensive metabolic panel     Status: Abnormal   Collection Time: 08/17/18 10:58 AM  Result Value Ref Range   Glucose 114 (H) 65 - 99 mg/dL   BUN 13 6 - 24 mg/dL   Creatinine, Ser 1.03 0.76 - 1.27 mg/dL   GFR calc non Af Amer 87 >59 mL/min/1.73   GFR calc Af Amer 100 >59 mL/min/1.73   BUN/Creatinine Ratio 13 9 - 20   Sodium 140 134 - 144 mmol/L   Potassium 4.2 3.5 - 5.2 mmol/L   Chloride 103 96 - 106 mmol/L   CO2 21 20 - 29 mmol/L   Calcium 9.1 8.7 -  10.2 mg/dL   Total Protein 6.6 6.0 - 8.5 g/dL   Albumin 4.0 4.0 - 5.0 g/dL    Comment:               **Please note reference interval change**   Globulin, Total 2.6 1.5 - 4.5 g/dL   Albumin/Globulin Ratio 1.5 1.2 - 2.2   Bilirubin Total <0.2 0.0 - 1.2 mg/dL    Comment: Sample is lipemic. This may cause spurious increases in TBili, DBili, AST, ALT, and UIBC (if ordered). Clinical correlation indicated.    Alkaline Phosphatase 102 39 - 117 IU/L   AST 41 (H) 0 - 40 IU/L    Comment: The specimen was lipemic.  The lipemia was cleared by ultracentrifugation before testing.  However HDL, direct LDL, cholesterol and triglyceride (if ordered) were  performed prior to ultracentrifugation.    ALT 56 (H) 0 - 44 IU/L  CBC with Differential/Platelet     Status: None   Collection Time: 08/17/18 10:58 AM  Result Value Ref Range   WBC 6.0 3.4 - 10.8 x10E3/uL   RBC 4.54 4.14 - 5.80 x10E6/uL   Hemoglobin 14.5 13.0 - 17.7 g/dL   Hematocrit 40.6 37.5 - 51.0 %   MCV 89 79 - 97 fL   MCH 31.9 26.6 - 33.0 pg   MCHC 35.7 31.5 - 35.7 g/dL   RDW 12.3 11.6 - 15.4 %   Platelets 223 150 - 450 x10E3/uL   Neutrophils 47 Not Estab. %   Lymphs 37 Not Estab. %   Monocytes 12 Not Estab. %   Eos 3 Not Estab. %   Basos 1 Not Estab. %   Neutrophils Absolute 2.8 1.4 - 7.0 x10E3/uL   Lymphocytes Absolute 2.2 0.7 - 3.1 x10E3/uL   Monocytes Absolute 0.7 0.1 - 0.9 x10E3/uL   EOS (ABSOLUTE) 0.2 0.0 - 0.4 x10E3/uL   Basophils Absolute 0.0 0.0 - 0.2 x10E3/uL   Immature Granulocytes 0 Not Estab. %   Immature Grans (Abs) 0.0 0.0 - 0.1 x10E3/uL  CUP PACEART REMOTE DEVICE CHECK     Status: None   Collection Time: 08/18/18 11:06 PM  Result Value Ref Range   Date Time Interrogation Session [id number]    Pulse Generator Manufacturer MERM    Pulse Gen Model WEX93 Reveal LINQ    Pulse Gen Serial Number [id number]    Clinic Name [facility]    Implantable Pulse Generator Type ICM/ILR    Implantable Pulse Generator Implant Date [id number]   CUP PACEART REMOTE DEVICE CHECK     Status: None   Collection Time: 09/20/18 11:42 PM  Result Value Ref Range   Date Time Interrogation Session [id number]    Pulse Generator Manufacturer MERM    Pulse Gen Model IRW43 Reveal LINQ    Pulse Gen Serial Number [id number]    Clinic Name [facility]    Implantable Pulse Generator Type ICM/ILR    Implantable Pulse Generator Implant Date [id number]     Mental status examination: Limited mental status admission none on the phone.  Patient is alert and oriented x3.  He denies any suicidal thoughts homicidal thoughts or any highs and lows.  His attention and concentration  is okay and he does not appear to be distracted on the phone.  He denies any paranoia, hallucination, delusions.  He reported no side effects of the medication.  There were no flight of ideas or any loose hallucination.  There were no grandiosity.  His fund of knowledge is adequate.  His  insight judgment is okay.  Assessment and Plan: Bipolar disorder type I.  Posttraumatic stress disorder by history.  Alcohol abuse.  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  Patient is doing better since dose of Lamictal and Seroquel increase.  He do not recall any side effects.  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  He admitted weight gain because lack of mobilization but hoping to resume his walk outside once things get better.  Like to continue his medication.  He is not taking Wellbutrin.  I will continue Seroquel 100 mg at bedtime and Lamictal 100 mg daily.  Discussed medication side effects and benefits.  Recommended to call us back if is any question or any concern.  I will see him in 3 months.  Follow Up Instructions:    I discussed the assessment and treatment plan with the patient. The patient was provided an opportunity to ask questions and all were answered. The patient agreed with the plan and demonstrated an understanding of the instructions.   The patient was advised to call back or seek an in-person evaluation if the symptoms worsen or if the condition fails to improve as anticipated.  I provided 15 minutes of non-face-to-face time during this encounter.   Kathlee Nations, MD

## 2018-09-28 MED FILL — QUETIAPINE FUMARATE 100 MG: 100 | 90 days supply | Qty: 90 | Fill #0

## 2018-10-01 ENCOUNTER — Telehealth: Payer: Self-pay | Admitting: Internal Medicine

## 2018-10-01 NOTE — Progress Notes (Signed)
Carelink Summary Report / Loop Recorder 

## 2018-10-01 NOTE — Telephone Encounter (Signed)
Will ask billing department to investigate pt's high out of pocket cost.

## 2018-10-01 NOTE — Telephone Encounter (Signed)
Pt states it cost him $200 dollars to do the home remote checks. He states he had the ILR for a year and we have not found any issues with him. I told him I will let Dr.Allred know about this and even though he do not want to do the remote checks to still use the monitor if he feels like he is having an episode. I did checked and made sure his next remote appointment was cancelled.

## 2018-10-01 NOTE — Telephone Encounter (Signed)
New Message:    Pt says he does not want any more Home Remote checks. He says it is too expensive.

## 2018-10-02 NOTE — Telephone Encounter (Signed)
Researched the patient's remote device charges.  He has [facility] [employer] and it appears that the charges are being applied to his annual deductible, so he is correct.  He does have a large balance for each charge.

## 2018-10-05 NOTE — Telephone Encounter (Signed)
Spoke with billing department to clarify monthly monitoring costs. His charges are being applied to his [employer] deductible, approx. $185 each month. Will make Dr. [name] aware.

## 2018-10-12 NOTE — Telephone Encounter (Signed)
LMOVM for patient. Per Dr. [name], okay to d/c home monitoring via [employer]. Plan to have pt unplug monitor, but keep at home in the event he wants to resume monitoring at a later date. Unenrolled from [place], return kit not ordered.

## 2018-10-13 NOTE — Telephone Encounter (Signed)
Spoke with patient. He is aware to call us if he wishes to resume monitoring in the future. Explained LINQ battery typically lasts ~3 years. Pt verbalizes understanding. He will unplug his monitor. He denies questions or concerns at this time and thanked me for my call.

## 2018-10-28 ENCOUNTER — Encounter (HOSPITAL_COMMUNITY): Payer: Self-pay

## 2018-10-28 ENCOUNTER — Ambulatory Visit (HOSPITAL_COMMUNITY): Payer: 59 | Attending: Cardiology

## 2018-11-04 ENCOUNTER — Other Ambulatory Visit: Payer: Self-pay

## 2018-11-04 ENCOUNTER — Ambulatory Visit (HOSPITAL_COMMUNITY): Payer: 59 | Attending: Cardiovascular Disease

## 2018-11-04 DIAGNOSIS — E785 Hyperlipidemia, unspecified: Secondary | ICD-10-CM | POA: Insufficient documentation

## 2018-11-04 DIAGNOSIS — Z8673 Personal history of transient ischemic attack (TIA), and cerebral infarction without residual deficits: Secondary | ICD-10-CM | POA: Insufficient documentation

## 2018-11-04 DIAGNOSIS — Q211 Atrial septal defect: Secondary | ICD-10-CM | POA: Insufficient documentation

## 2018-11-04 DIAGNOSIS — Q2112 Patent foramen ovale: Secondary | ICD-10-CM

## 2018-11-04 NOTE — Progress Notes (Signed)
[facility]   MULTIDISCIPLINARY HEART VALVE TEAM     Virtual Visit via Video Note   This visit type was conducted due to national recommendations for restrictions regarding the COVID-19 Pandemic (e.g. social distancing) in an effort to limit this patient's exposure and mitigate transmission in our community.  Due to his co-morbid illnesses, this patient is at least at moderate risk for complications without adequate follow up.  This format is felt to be most appropriate for this patient at this time.  All issues noted in this document were discussed and addressed.  A limited physical exam was performed with this format.  Please refer to the patient's chart for his consent to telehealth for [facility].   Evaluation Performed:  Follow-up visit  Date:  11/05/2018   ID:  Steven Herrera, DOB 21-Jun-1971, MRN [id number]  Patient Location: Home Provider Location: Office  PCP:  [name], NP  Cardiologist: Dr. Burt Knack  Electrophysiologist:  Dr. [name] (ILR implanted, but now turned off)   Chief Complaint:  1 year s/p PFO closure   History of Present Illness:    Steven Herrera is a 48 y.o. male with a history of HLD, cryptogenic CVA and PFO with atrial septal aneurysm s/p PFO closure (11/19/17) who presents   The patient does not have symptoms concerning for COVID-19 infection (fever, chills, cough, or new shortness of breath).   He presented in January 2019 with clumsiness and confusion.He was noted to have an acute/subacute infarct involving the left lentiform nucleus, left internal capsule, and left caudate head. His symptoms improved and he was not left with any specific residual deficits. His evaluation included a hypercoagulability panel which was negative, he had no evidence of large vessel occlusive disease or heavy aortic atherosclerosis. 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 A  transcranial Doppler study demonstrated a moderate right to left shunt at the intracardiac level. ILR was implanted in 09/2017.   He was seen by Dr Burt Knack who felt he was a suitable candidate for PFO closure which was set up for 11/19/17. He underwent successful transcatheter PFO closure using a 25 mm Amplatzer occluder device. Follow up limited echo showed successful closure of PFO with no color doppler evidence of residual PFO. He was discharged on ASA and plavix x3 months. No afib with detected on routine monitoring of ILR. It was later turned off due to high out of pocket cost to patient.   He was seen in the ER in 03/15/18 for a traumatic toe amputation from a lawn mower accident. Dr. Lucia Gaskins with ortho completed a revision right hallux amputation in the ER.   Today he presents for follow up. 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 No neurologic symptoms. No CP or SOB. No LE edema, orthopnea or PND. No dizziness or syncope. No blood in stool or urine. No palpitations.     Past Medical History:  Diagnosis Date   Depression    High cholesterol    PFO (patent foramen ovale)    a. s/p PFO closure by Dr. Burt Knack on 11/19/17   Stroke [facility])    Past Surgical History:  Procedure Laterality Date   big toe amputation Right    CARPAL TUNNEL RELEASE     LOOP RECORDER INSERTION N/A 09/25/2017   Procedure: LOOP RECORDER INSERTION;  Surgeon:  Grayer, MD;  Location: [place] CV LAB;  Service:  Cardiovascular;  Laterality: N/A;   MOUTH SURGERY     PATENT FORAMEN OVALE(PFO) CLOSURE N/A 11/19/2017   Procedure: PATENT FORAMEN OVALE (PFO) CLOSURE;  Surgeon: [name], MD;  Location: [place] CV LAB;  Service: Cardiovascular;  Laterality: N/A;   spider bite     TEE WITHOUT CARDIOVERSION N/A 09/25/2017   Procedure: TRANSESOPHAGEAL ECHOCARDIOGRAM (TEE) WITH LOOP;  Surgeon: Pixie Casino, MD;  Location: [facility] ENDOSCOPY;  Service: Cardiovascular;  Laterality:  N/A;   VASECTOMY       Current Meds  Medication Sig   aspirin EC 81 MG tablet Take 81 mg by mouth daily with lunch.    atorvastatin (LIPITOR) 40 MG tablet TAKE 1 TABLET BY MOUTH ONCE DAILY AT 6 PM   lamoTRIgine (LAMICTAL) 100 MG tablet Take 1 tablet (100 mg total) by mouth daily.   Menthol, Topical Analgesic, (ICY HOT EX) Apply 1 application topically 3 (three) times daily as needed (pain).    Multiple Vitamin (MULTIVITAMIN WITH MINERALS) TABS tablet Take 1 tablet by mouth daily with lunch.   naproxen sodium (ALEVE) 220 MG tablet Take 220-440 mg by mouth 3 (three) times daily as needed (pain/headaches).   omega-3 acid ethyl esters (LOVAZA) 1 g capsule Take 2 capsules (2 g total) by mouth 2 (two) times daily.   QUEtiapine (SEROQUEL) 100 MG tablet Take 1 tablet (100 mg total) by mouth at bedtime.   Vitamin D, Ergocalciferol, (DRISDOL) 1.25 MG (50000 UT) CAPS capsule Take 1 capsule (50,000 Units total) by mouth every 7 (seven) days.   [DISCONTINUED] clopidogrel (PLAVIX) 75 MG tablet Take 1 tablet (75 mg total) by mouth daily.     Allergies:   Ibuprofen and Penicillins   Social History   Tobacco Use   Smoking status: Former Smoker    Packs/day: 0.25    Years: 28.00    Pack years: 7.00    Types: Cigarettes    Last attempt to quit: 07/25/2017    Years since quitting: 1.2   Smokeless tobacco: Never Used  Substance Use Topics   Alcohol use: Yes    Alcohol/week: 6.0 standard drinks    Types: 6 Cans of beer per week    Comment: 6 pack nightly   Drug use: Yes    Types: Marijuana    Comment: takes daily for pain leisure      Family Hx: The patient's family history includes Alcohol abuse in his father; Atrial fibrillation in his mother; Dementia in his mother; Drug abuse in his sister; Healthy in his sister; Heart disease in his mother; Hypertension in his brother and father; Stroke in his father.  ROS:   Please see the history of present illness.    All other systems  reviewed and are negative.   Prior CV studies:   The following studies were reviewed today:  11/19/17 Conclusion Successful transcatheter PFO closure using a 25 mm Amplatzer PFO occluder device  Recommend:  Limited echo prior to discharge today  ASA/Clopidogrel x 3 months, then ASA alone  SBE prophylaxis when indicated x 6 months   ____________  11/19/17 Study Conclusions - HPI and indications: Limited study for PFO closure. - Left ventricle: Wall thickness was increased in a pattern of mild LVH. Systolic function was normal. The estimated ejection fraction was in the range of 60% to 65%. Wall motion was normal; there were no regional wall motion abnormalities. - Atrial septum: Well-seated Amplatzer device noted on the atrial septum. No PFO noted by color doppler. Saline microbubble contrast  not performed. Impressions: - Successful closure a PFO with Amplatzer occluder. No color doppler evidence of residual PFO.  ____________  Echo 11/04/18 IMPRESSION  1. The left ventricle has normal systolic function, with an ejection fraction of 60-65%. The cavity size was normal. Left ventricular diastolic Doppler parameters are consistent with impaired relaxation.  2. The right ventricle has normal systolc function. The cavity was normal. There is no increase in right ventricular wall thickness. Right ventricular systolic pressure could not be assessed.  3. No ASD or PFO by color flow Doppler or saline microcavitation study.  4. Aortic valve regurgitation was not assessed by color flow Doppler.  5. Pulmonic valve regurgitation was not assessed by color flow Doppler.  6. 25 mm Amplatzer device present. No residual shunt noted.   Labs/Other Tests and Data Reviewed:    EKG:  No ECG reviewed.  Recent Labs: 08/17/2018: ALT 56; BUN 13; Creatinine, Ser 1.03; Hemoglobin 14.5; Platelets 223; Potassium 4.2; Sodium 140; TSH 1.270   Recent Lipid Panel Lab Results  Component  Value Date/Time   CHOL 177 08/17/2018 10:58 AM   TRIG 1,350 (HH) 08/17/2018 10:58 AM   HDL 19 (L) 08/17/2018 10:58 AM   CHOLHDL 9.3 (H) 08/17/2018 10:58 AM   CHOLHDL 8.2 06/27/2017 08:43 AM   LDLCALC Comment 08/17/2018 10:58 AM    Wt Readings from Last 3 Encounters:  08/10/18 211 lb 14.4 oz (96.1 kg)  07/24/18 211 lb (95.7 kg)  06/01/18 216 lb (98 kg)     Objective:    Vital Signs:  There were no vitals taken for this visit.   Well nourished, well developed male in no acute distress.   ASSESSMENT & PLAN:    PFO with atrial septal aneurysm s/p PFO closure: echo w/ bubble yesterday showed normally functioning Amplatzer device with no residual shunt. Continue on baby aspirin.   HLD: continue statin   Cryptogenic CVA: continue aspirin and statin. Now s/p PFO closure   COVID-19 Education: the signs and symptoms of COVID-19 were discussed with the patient and how to seek care for testing (follow up with PCP or arrange E-visit).  The importance of social distancing was discussed today.  Time:   Today, I have spent 7 minutes with the patient with telehealth technology discussing the above problems.     Medication Adjustments/Labs and Tests Ordered: Current medicines are reviewed at length with the patient today.  Concerns regarding medicines are outlined above.   Tests Ordered: No orders of the defined types were placed in this encounter.   Medication Changes: No orders of the defined types were placed in this encounter.   Disposition:  Follow up prn  Signed, Angelena Form, PA-C  11/05/2018 12:08 PM    [place]

## 2018-11-05 ENCOUNTER — Telehealth (INDEPENDENT_AMBULATORY_CARE_PROVIDER_SITE_OTHER): Payer: 59 | Admitting: Physician Assistant

## 2018-11-05 DIAGNOSIS — Z7189 Other specified counseling: Secondary | ICD-10-CM

## 2018-11-05 DIAGNOSIS — E785 Hyperlipidemia, unspecified: Secondary | ICD-10-CM

## 2018-11-05 DIAGNOSIS — Z8774 Personal history of (corrected) congenital malformations of heart and circulatory system: Secondary | ICD-10-CM | POA: Diagnosis not present

## 2018-11-05 DIAGNOSIS — Z8673 Personal history of transient ischemic attack (TIA), and cerebral infarction without residual deficits: Secondary | ICD-10-CM

## 2018-11-05 MED FILL — VIT D2 1.25 MG (50,000 UNIT: 1.25 MG | 27 days supply | Qty: 4 | Fill #1

## 2018-11-05 NOTE — Progress Notes (Signed)
Great - thanks

## 2018-11-22 IMAGING — DX DG FOOT COMPLETE 3+V*R*
2 series · 3 of 3 positions shown · non-contrast
Comparison: None.

CLINICAL DATA: Toe amputation with lawnmower.

EXAM:
RIGHT FOOT COMPLETE - 3+ VIEW

[Series 1: foot · 0.14mm/px · 2 of 2 slices shown]
[im 1/2]
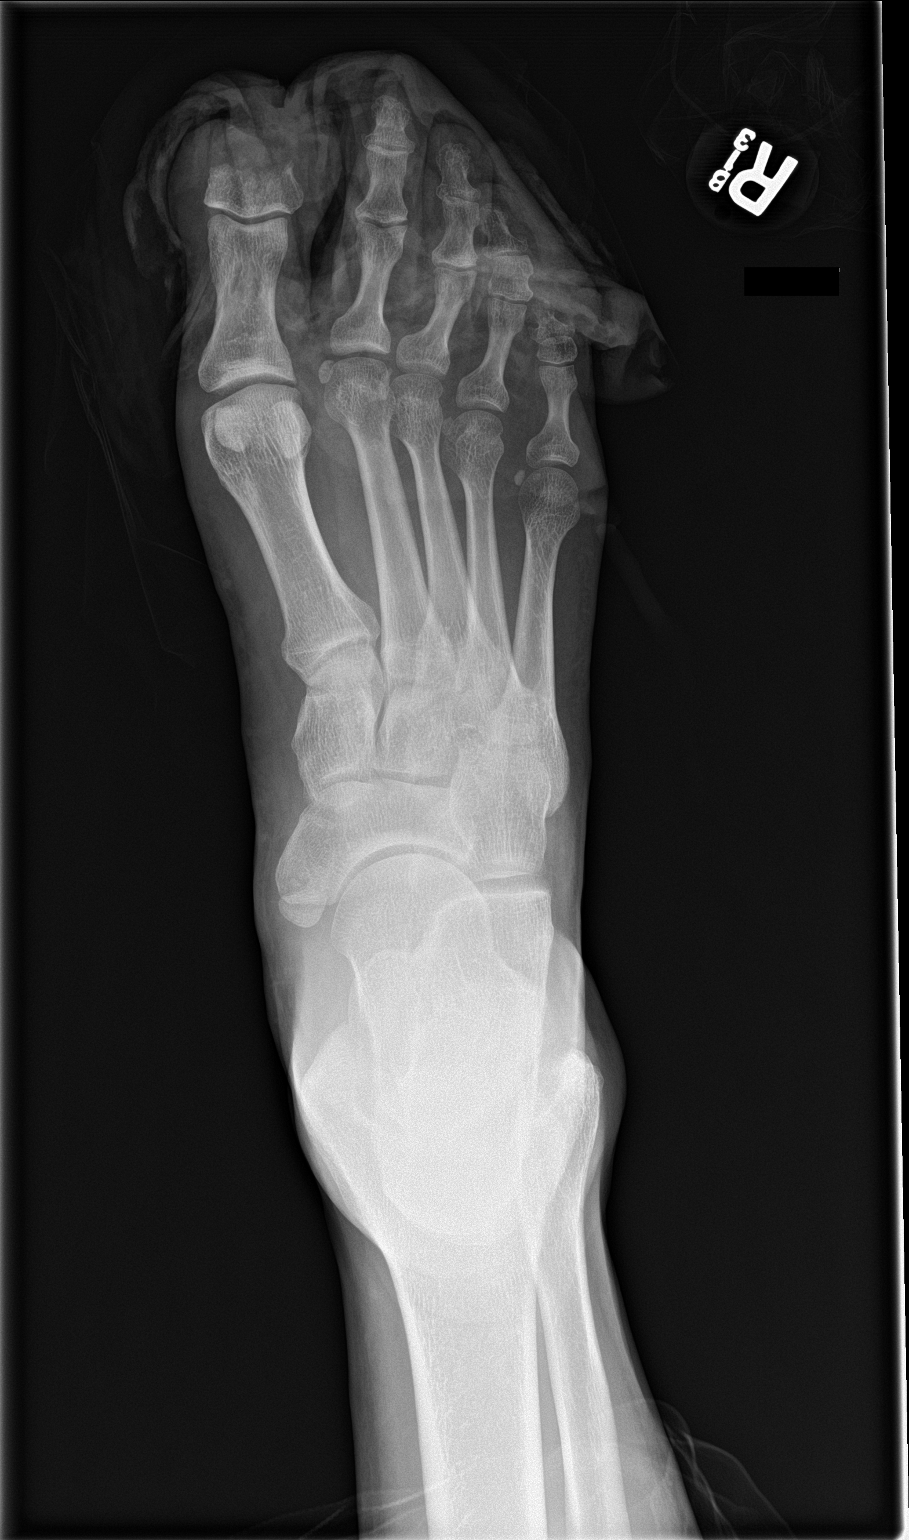
[im 2/2]
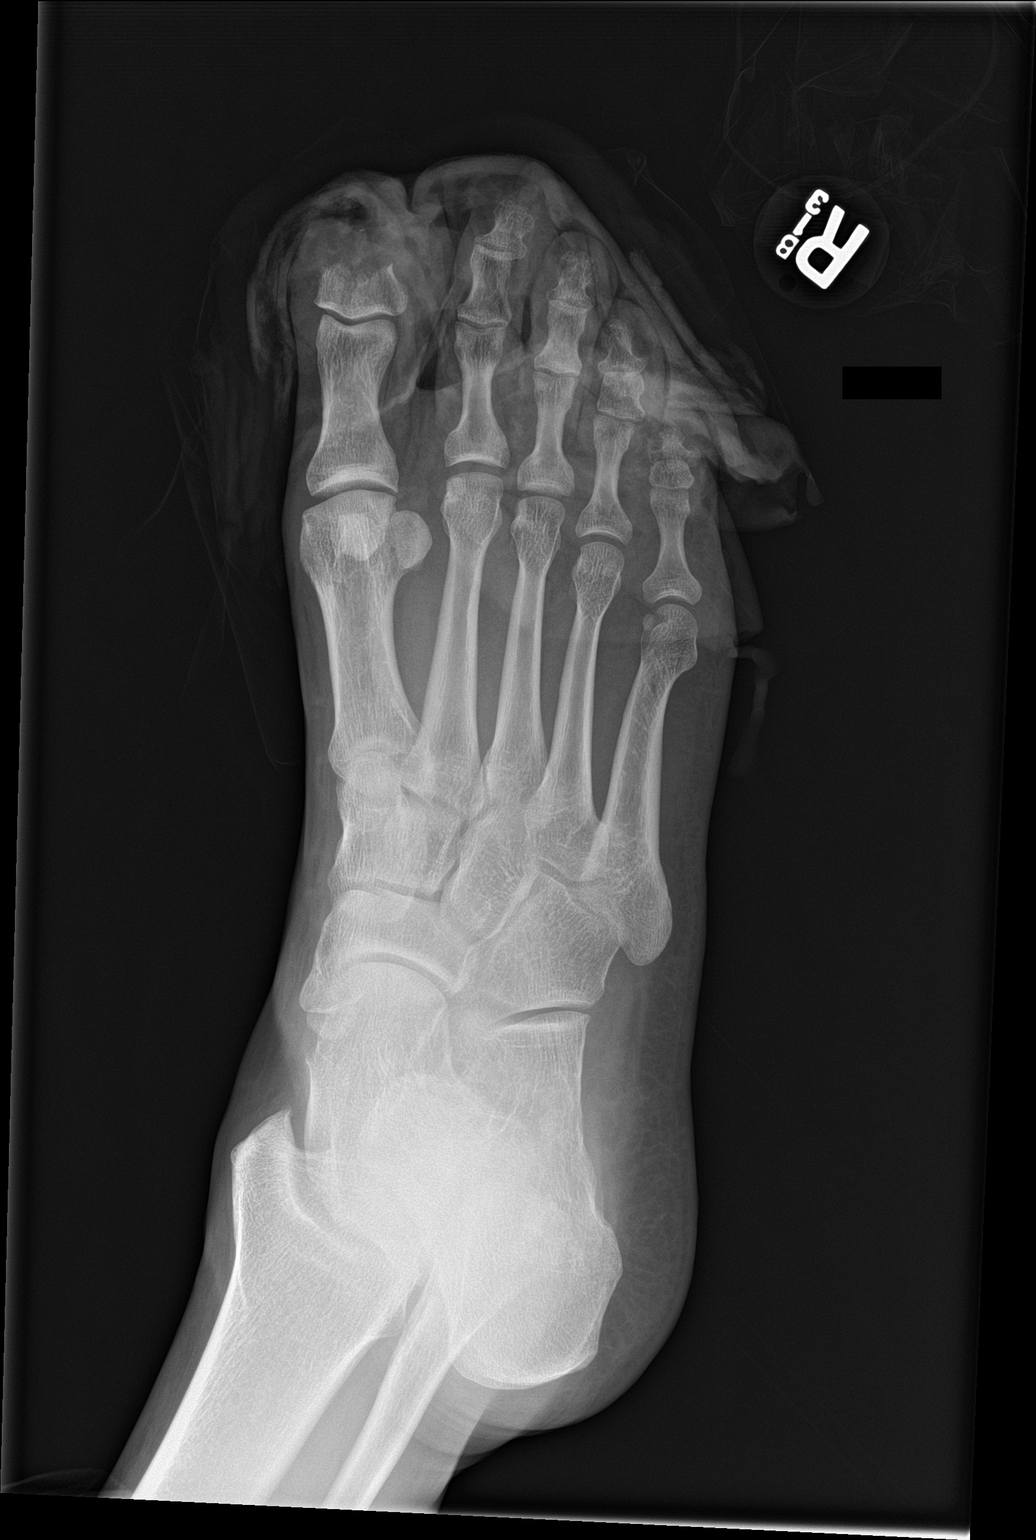

[leg]
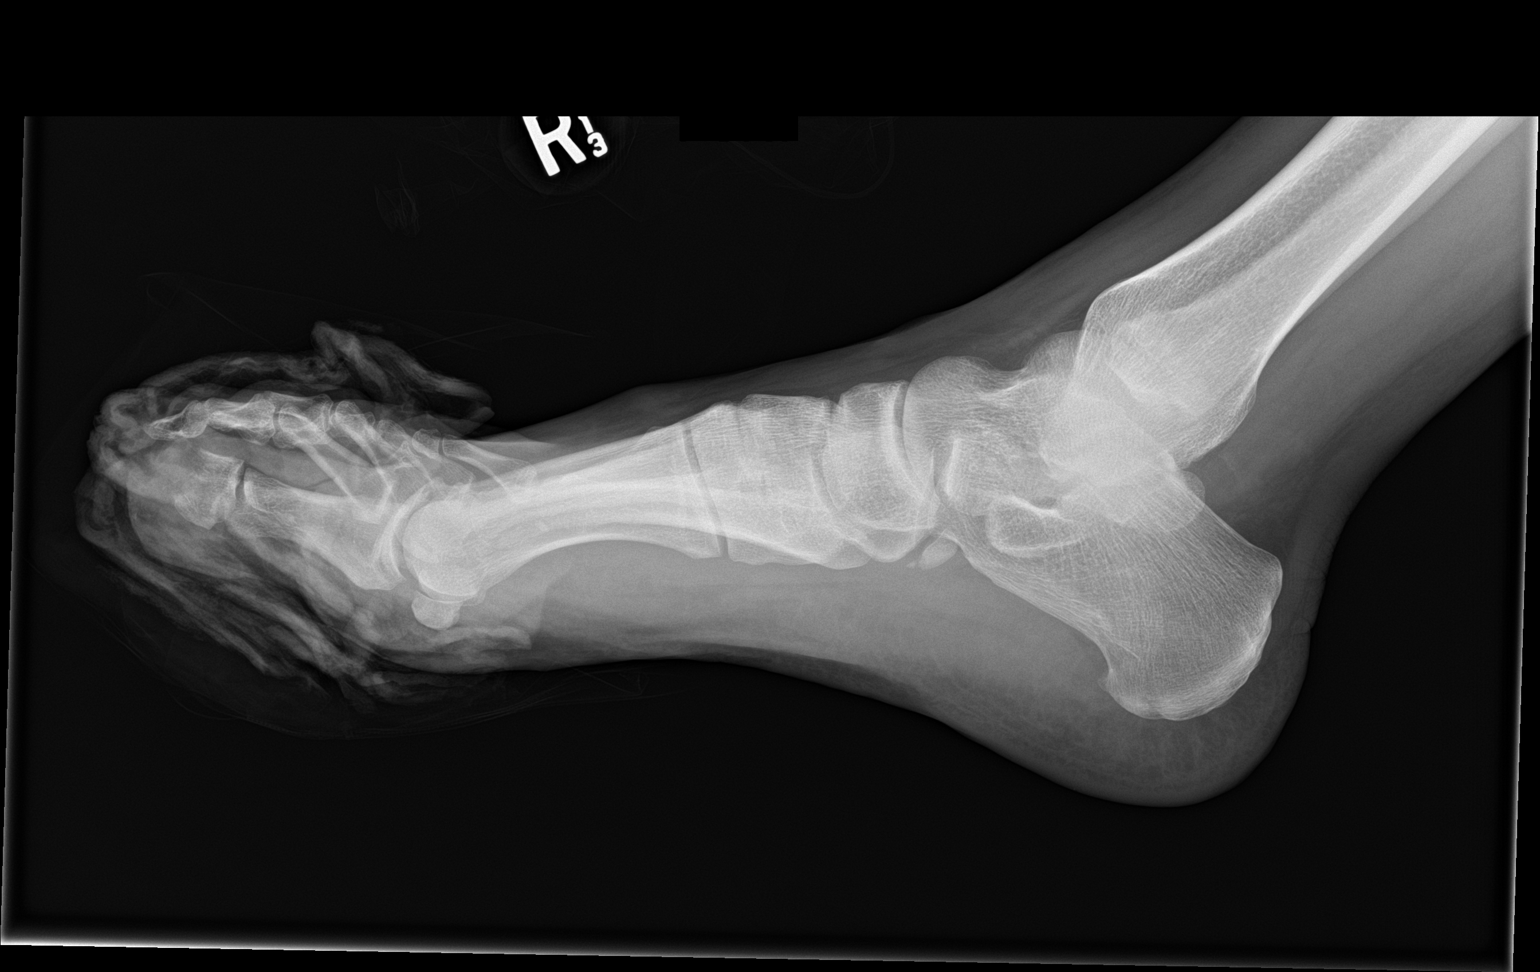

[3 of 3 positions shown; findings below may reference images not displayed]

FINDINGS: Amputation distal great toe identified at the level of the base of
the distal phalanx. Overlying soft tissue irregularity and swelling
is compatible with the amputation. Soft tissue in the toes is
obscured by overlying bandage material. No other fracture evident.
IMPRESSION: Great toe amputation with loss of distal phalanx at the level of the
base, just above the articular surface..

## 2018-11-26 ENCOUNTER — Other Ambulatory Visit: Payer: Self-pay | Admitting: Adult Health

## 2018-11-26 MED FILL — ATORVASTATIN 40 MG TABLET: 40 | 90 days supply | Qty: 90 | Fill #0

## 2018-12-22 ENCOUNTER — Ambulatory Visit (INDEPENDENT_AMBULATORY_CARE_PROVIDER_SITE_OTHER): Payer: 59 | Admitting: Psychiatry

## 2018-12-22 ENCOUNTER — Encounter (HOSPITAL_COMMUNITY): Payer: Self-pay | Admitting: Psychiatry

## 2018-12-22 ENCOUNTER — Other Ambulatory Visit: Payer: Self-pay

## 2018-12-22 DIAGNOSIS — F319 Bipolar disorder, unspecified: Secondary | ICD-10-CM | POA: Diagnosis not present

## 2018-12-22 DIAGNOSIS — F4310 Post-traumatic stress disorder, unspecified: Secondary | ICD-10-CM | POA: Diagnosis not present

## 2018-12-22 MED ORDER — QUETIAPINE FUMARATE 100 MG PO TABS: 100.0000 mg | ORAL_TABLET | Freq: Every day | ORAL | 0 refills | Status: DC

## 2018-12-22 MED ORDER — LAMOTRIGINE 100 MG PO TABS: 100.0000 mg | ORAL_TABLET | Freq: Every day | ORAL | 0 refills | Status: DC

## 2018-12-22 MED FILL — SUBVENITE 100 MG TABS: 100 | 30 days supply | Qty: 30 | Fill #0

## 2018-12-22 MED FILL — QUETIAPINE FUMARATE 100 MG: 100 | 90 days supply | Qty: 90 | Fill #0

## 2018-12-22 NOTE — Progress Notes (Signed)
Virtual Visit via Telephone Note  I connected with Steven Herrera on 12/22/18 at 11:20 AM EDT by telephone and verified that I am speaking with the correct person using two identifiers.   I discussed the limitations, risks, security and privacy concerns of performing an evaluation and management service by telephone and the availability of in person appointments. I also discussed with the patient that there may be a patient responsible charge related to this service. The patient expressed understanding and agreed to proceed.   History of Present Illness: Patient was evaluated by phone session.  He is taking Seroquel and Lamictal.  He admitted his mood swings irritability is much better.  He has mild tremors but he is not sure what causing it since he had it for a long time.  Since he stopped the Wellbutrin his mood is much better.  He has some anxiety due to COVID-19 as his business is slow.  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  He is sleeping good.  He lives with his wife who is very supportive.  He admitted weight gain since the COVID-19 since he is not able to go outside.  He tried to do regular exercise and sometimes outside walking.  Denies any crying spells, feeling of hopelessness or worthlessness.  He like to continue Lamictal and Seroquel but is helping his mood, irritability, nightmares and flashback.  He has no rash or itching.  His energy level is good.  He admitted occasional drinking beer but he had stopped marijuana since he is taking the medication.   Past Psychiatric History:Reviewed. History of briefly seeing therapist at [facility] and his young age. History of mood swing, anger, agitation, hyperactivity and running away. Used acid, LSD, cocaine, marijuana and heavy drinking. Tried Zoloft, Lexapro and Xanax did not like it. Took Paxil and Klonopin and Wellbutrin from PCP.   Psychiatric Specialty Exam: Physical Exam  ROS   There were no vitals taken for this visit.There is no height or weight on file to calculate BMI.  General Appearance: NA  Eye Contact:  NA  Speech:  Clear and Coherent and Normal Rate  Volume:  Normal  Mood:  Euthymic  Affect:  NA  Thought Process:  Coherent and Goal Directed  Orientation:  Full (Time, Place, and Person)  Thought Content:  WDL  Suicidal Thoughts:  No  Homicidal Thoughts:  No  Memory:  Immediate;   Good Recent;   Good Remote;   Good  Judgement:  Good  Insight:  Good  Psychomotor Activity:  Normal  Concentration:  Concentration: Good and Attention Span: Good  Recall:  Good  Fund of Knowledge:  Good  Language:  Good  Akathisia:  No  Handed:  Right  AIMS (if indicated):     Assets:  Communication Skills Desire for Improvement Housing Resilience Social Support Talents/Skills Transportation  ADL's:  Intact  Cognition:  WNL  Sleep:         Assessment and Plan: Bipolar disorder type I.  Posttraumatic stress disorder by history.  Alcohol use.  Patient doing better on his medication.  He has no rash or itching.  He admitted sometimes mild tremors but he recall it is there for a long time and do not feel it is causing by the medication.  We discussed healthy lifestyle and watch his calorie intake and do regular walking.  He agreed with the plan.  He is not interested in therapy.  I will continue Seroquel 100 mg at bedtime and Lamictal 100 mg daily.  Discussed his alcohol use which has cut down significantly from the past.  Recommended to call us back if is any question or any concern.  Follow-up in 3 months.  Follow Up Instructions:    I discussed the assessment and treatment plan with the patient. The patient was provided an opportunity to ask questions and all were answered. The patient agreed with the plan and demonstrated an understanding of the instructions.   The patient was advised to call back or seek an in-person evaluation if the symptoms worsen or  if the condition fails to improve as anticipated.  I provided 20 minutes of non-face-to-face time during this encounter.   Kathlee Nations, MD

## 2019-01-05 NOTE — Progress Notes (Signed)
Subjective:    Patient ID: Steven Herrera, male    DOB: 1970/09/06, 48 y.o.   MRN: [id number]  HPI:  Steven Herrera presents with diffuse rash on  His hands, bil FAs, lower abdomen, bil groin, bil thighs, bil shins, bil feet. He traveled to [place] [facility] 5-10 July, then rash developed 36 hrs later on Sunday. He reports itching and tightness- he has been using OTC Diphenhydramine 25mg  2-3 times/day, and oatmeal bath with only minimal sx relief. He reports the [place] motel they stayed in was meticulously cleaned. He denies starting new medications. He denies changing laundry detergents. He denies eating any new/strange foods. He denies hiking or recent tick bite. He denies sitting in the sand when at the beach- sat in [employer]. He denies fever/night sweats/malaise/change in appetite/change in sense of smell or taste. He denies N/V/D He estimates to drink >60 oz water/day, denies tobacco/vape use   Patient Care Team    Relationship Specialty Notifications Start End  Mina Marble D, NP PCP - General Family Medicine  07/11/17   Garvin Fila, MD Consulting Physician Neurology  07/11/17   Freddie Breech [facility]    08/25/17     Patient Active Problem List   Diagnosis Date Noted  . Contact dermatitis 01/06/2019  . Vitamin D deficiency 06/01/2018  . High cholesterol 02/09/2018  . 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 On statin therapy 08/25/2017  . Healthcare maintenance 07/11/2017  . Depression with anxiety 06/27/2017  . Cerebrovascular accident (CVA) ([place]) 06/27/2017  . Acute ischemic stroke ([place]) 06/26/2017  . Elevated blood-pressure reading without diagnosis of hypertension 06/26/2017  . NEVUS 02/13/2007     Past Medical History:  Diagnosis Date  . Depression   . High cholesterol   . PFO (patent foramen ovale)    a. s/p PFO closure by Dr. Burt Knack on 11/19/17  . Stroke [facility])      Past Surgical History:  Procedure Laterality Date  . big toe  amputation Right   . CARPAL TUNNEL RELEASE    . LOOP RECORDER INSERTION N/A 09/25/2017   Procedure: LOOP RECORDER INSERTION;  Surgeon: [name], MD;  Location: [place] CV LAB;  Service: Cardiovascular;  Laterality: N/A;  . MOUTH SURGERY    . PATENT FORAMEN OVALE(PFO) CLOSURE N/A 11/19/2017   Procedure: PATENT FORAMEN OVALE (PFO) CLOSURE;  Surgeon: [name], MD;  Location: [place] CV LAB;  Service: Cardiovascular;  Laterality: N/A;  . spider bite    . TEE WITHOUT CARDIOVERSION N/A 09/25/2017   Procedure: TRANSESOPHAGEAL ECHOCARDIOGRAM (TEE) WITH LOOP;  Surgeon: Pixie Casino, MD;  Location: [facility] ENDOSCOPY;  Service: Cardiovascular;  Laterality: N/A;  . VASECTOMY       Family History  Problem Relation Age of Onset  . Stroke Father        disabling CVA in early-mid 50's  . Hypertension Father   . Alcohol abuse Father   . Heart disease Mother   . Dementia Mother   . Atrial fibrillation Mother   . Drug abuse Sister   . Hypertension Brother   . Healthy Sister      Social History   Substance and Sexual Activity  Drug Use Yes  . Types: Marijuana   Comment: takes daily for pain leisure      Social History   Substance and Sexual Activity  Alcohol Use Yes  . Alcohol/week: 6.0 standard drinks  . Types: 6 Cans of beer per week  Comment: 6 pack nightly     Social History   Tobacco Use  Smoking Status Former Smoker  . Packs/day: 0.25  . Years: 28.00  . Pack years: 7.00  . Types: Cigarettes  . Quit date: 07/25/2017  . Years since quitting: 1.4  Smokeless Tobacco Never Used     Outpatient Encounter Medications as of 01/06/2019  Medication Sig  . aspirin EC 81 MG tablet Take 81 mg by mouth daily with lunch.   Marland Kitchen atorvastatin (LIPITOR) 40 MG tablet TAKE 1 TABLET BY MOUTH ONCE DAILY AT 6 PM  . lamoTRIgine (LAMICTAL) 100 MG tablet Take 1 tablet (100 mg total) by mouth daily.  . Menthol, Topical Analgesic, (ICY HOT EX) Apply 1 application topically 3 (three)  times daily as needed (pain).   . Multiple Vitamin (MULTIVITAMIN WITH MINERALS) TABS tablet Take 1 tablet by mouth daily with lunch.  . naproxen sodium (ALEVE) 220 MG tablet Take 220-440 mg by mouth 3 (three) times daily as needed (pain/headaches).  Marland Kitchen omega-3 acid ethyl esters (LOVAZA) 1 g capsule Take 2 capsules (2 g total) by mouth 2 (two) times daily.  . QUEtiapine (SEROQUEL) 100 MG tablet Take 1 tablet (100 mg total) by mouth at bedtime.  . Vitamin D, Ergocalciferol, (DRISDOL) 1.25 MG (50000 UT) CAPS capsule Take 1 capsule (50,000 Units total) by mouth every 7 (seven) days.  . predniSONE (STERAPRED UNI-PAK 21 TAB) 10 MG (21) TBPK tablet Take per Pak Instructions   No facility-administered encounter medications on file as of 01/06/2019.     Allergies: Ibuprofen and Penicillins  Body mass index is 29.09 kg/m.  Blood pressure 120/83, pulse 75, temperature 98.4 F (36.9 C), temperature source Oral, height 6' (1.829 m), weight 214 lb 8 oz (97.3 kg), SpO2 99 %.     Review of Systems  Constitutional: Positive for fatigue. Negative for activity change, appetite change, chills, diaphoresis, fever and unexpected weight change.  Eyes: Negative for visual disturbance.  Respiratory: Negative for cough, chest tightness, shortness of breath, wheezing and stridor.   Cardiovascular: Negative for chest pain, palpitations and leg swelling.  Gastrointestinal: Negative for abdominal distention, anal bleeding, blood in stool, constipation, diarrhea, nausea and vomiting.  Endocrine: Negative for cold intolerance, heat intolerance, polydipsia, polyphagia and polyuria.  Skin: Positive for color change and rash. Negative for pallor and wound.  Neurological: Negative for dizziness and headaches.  Hematological: Negative for adenopathy. Does not bruise/bleed easily.  Psychiatric/Behavioral: Negative for agitation, behavioral problems, confusion, decreased concentration, dysphoric mood, hallucinations,  self-injury, sleep disturbance and suicidal ideas. The patient is not nervous/anxious and is not hyperactive.        Objective:   Physical Exam Vitals signs and nursing note reviewed.  Constitutional:      General: He is not in acute distress.    Appearance: Normal appearance. He is not ill-appearing, toxic-appearing or diaphoretic.  HENT:     Head: Normocephalic and atraumatic.  Eyes:     Extraocular Movements: Extraocular movements intact.     Conjunctiva/sclera: Conjunctivae normal.     Pupils: Pupils are equal, round, and reactive to light.  Skin:    General: Skin is warm.     Capillary Refill: Capillary refill takes less than 2 seconds.     Findings: Rash present. Rash is papular.       Neurological:     Mental Status: He is alert and oriented to person, place, and time.  Psychiatric:        Mood and Affect: Mood  normal.        Behavior: Behavior normal.        Thought Content: Thought content normal.        Judgment: Judgment normal.       Assessment & Plan:   1. Contact dermatitis, unspecified contact dermatitis type, unspecified trigger     Contact dermatitis Please take Prednisone Dose Pak per instructions. Please take OTC Claritin or Allegra in am, OTC Benadryl in the evening. OTC Hydrocortisone 1% cream topical as needed. If symptoms persist after Prednisone completed, please call clinic. Continue to social distance and wear a mask when in clinic.    FOLLOW-UP:  Return if symptoms worsen or fail to improve.

## 2019-01-06 ENCOUNTER — Encounter: Payer: Self-pay | Admitting: Adult Health

## 2019-01-06 ENCOUNTER — Other Ambulatory Visit: Payer: Self-pay

## 2019-01-06 ENCOUNTER — Ambulatory Visit (INDEPENDENT_AMBULATORY_CARE_PROVIDER_SITE_OTHER): Payer: 59 | Admitting: Adult Health

## 2019-01-06 DIAGNOSIS — L259 Unspecified contact dermatitis, unspecified cause: Secondary | ICD-10-CM | POA: Diagnosis not present

## 2019-01-06 MED ORDER — PREDNISONE 10 MG (21) PO TBPK: ORAL_TABLET | ORAL | 0 refills | Status: DC

## 2019-01-06 MED FILL — predniSONE 10 MG (21) TBPK: 10 | 6 days supply | Qty: 21 | Fill #0

## 2019-01-06 NOTE — Assessment & Plan Note (Signed)
Please take Prednisone Dose Pak per instructions. Please take OTC Claritin or Allegra in am, OTC Benadryl in the evening. OTC Hydrocortisone 1% cream topical as needed. If symptoms persist after Prednisone completed, please call clinic. Continue to social distance and wear a mask when in clinic.

## 2019-01-06 NOTE — Patient Instructions (Addendum)
Contact Dermatitis Dermatitis is redness, soreness, and swelling (inflammation) of the skin. Contact dermatitis is a reaction to certain substances that touch the skin. Many different substances can cause contact dermatitis. There are two types of contact dermatitis:  Irritant contact dermatitis. This type is caused by something that irritates your skin, such as having dry hands from washing them too often with soap. This type does not require previous exposure to the substance for a reaction to occur. This is the most common type.  Allergic contact dermatitis. This type is caused by a substance that you are allergic to, such as poison ivy. This type occurs when you have been exposed to the substance (allergen) and develop a sensitivity to it. Dermatitis may develop soon after your first exposure to the allergen, or it may not develop until the next time you are exposed and every time thereafter. What are the causes? Irritant contact dermatitis is most commonly caused by exposure to:  Makeup.  Soaps.  Detergents.  Bleaches.  Acids.  Metal salts, such as nickel. Allergic contact dermatitis is most commonly caused by exposure to:  Poisonous plants.  Chemicals.  Jewelry.  Latex.  Medicines.  Preservatives in products, such as clothing. What increases the risk? You are more likely to develop this condition if you have:  A job that exposes you to irritants or allergens.  Certain medical conditions, such as asthma or eczema. What are the signs or symptoms? Symptoms of this condition may occur on your body anywhere the irritant has touched you or is touched by you.  Symptoms include: ? Dryness or flaking. ? Redness. ? Cracks. ? Itching. ? Pain or a burning feeling. ? Blisters. ? Drainage of small amounts of blood or clear fluid from skin cracks. With allergic contact dermatitis, there may also be swelling in areas such as the eyelids, mouth, or genitals. How is this  diagnosed? This condition is diagnosed with a medical history and physical exam.  A patch skin test may be performed to help determine the cause.  If the condition is related to your job, you may need to see an occupational medicine specialist. How is this treated? This condition is treated by checking for the cause of the reaction and protecting your skin from further contact. Treatment may also include:  Steroid creams or ointments. Oral steroid medicines may be needed in more severe cases.  Antibiotic medicines or antibacterial ointments, if a skin infection is present.  Antihistamine lotion or an antihistamine taken by mouth to ease itching.  A bandage (dressing). Follow these instructions at home: Skin care  Moisturize your skin as needed.  Apply cool compresses to the affected areas.  Try applying baking soda paste to your skin. Stir water into baking soda until it reaches a paste-like consistency.  Do not scratch your skin, and avoid friction to the affected area.  Avoid the use of soaps, perfumes, and dyes. Medicines  Take or apply over-the-counter and prescription medicines only as told by your health care provider.  If you were prescribed an antibiotic medicine, take or apply the antibiotic as told by your health care provider. Do not stop using the antibiotic even if your condition improves. Bathing  Try taking a bath with: ? Epsom salts. Follow the instructions on the packaging. You can get these at your local pharmacy or grocery store. ? Baking soda. Pour a small amount into the bath as directed by your health care provider. ? Colloidal oatmeal. Follow the instructions on the   packaging. You can get this at your local pharmacy or grocery store.  Bathe less frequently, such as every other day.  Bathe in lukewarm water. Avoid using hot water. Bandage care  If you were given a bandage (dressing), change it as told by your health care provider.  Wash your hands  with soap and water before and after you change your dressing. If soap and water are not available, use hand sanitizer. General instructions  Avoid the substance that caused your reaction. If you do not know what caused it, keep a journal to try to track what caused it. Write down: ? What you eat. ? What cosmetic products you use. ? What you drink. ? What you wear in the affected area. This includes jewelry.  More redness, swelling, or pain.  More fluid or blood.  Warmth.  Pus or a bad smell.  Keep all follow-up visits as told by your health care provider. This is important. Contact a health care provider if:  Your condition does not improve with treatment.  Your condition gets worse.  You have signs of infection such as swelling, tenderness, redness, soreness, or warmth in the affected area.  You have a fever.  You have new symptoms. Get help right away if:  You have a severe headache, neck pain, or neck stiffness.  You vomit.  You feel very sleepy.  You notice red streaks coming from the affected area.  Your bone or joint underneath the affected area becomes painful after the skin has healed.  The affected area turns darker.  You have difficulty breathing. Summary  Dermatitis is redness, soreness, and swelling (inflammation) of the skin. Contact dermatitis is a reaction to certain substances that touch the skin.  Symptoms of this condition may occur on your body anywhere the irritant has touched you or is touched by you.  This condition is treated by figuring out what caused the reaction and protecting your skin from further contact. Treatment may also include medicines and skin care.  Avoid the substance that caused your reaction. If you do not know what caused it, keep a journal to try to track what caused it.  Contact a health care provider if your condition gets worse or you have signs of infection such as swelling, tenderness, redness, soreness, or warmth  in the affected area. This information is not intended to replace advice given to you by your health care provider. Make sure you discuss any questions you have with your health care provider. Document Released: 06/07/2000 Document Revised: 09/30/2018 Document Reviewed: 12/24/2017 Elsevier Patient Education  [place].  Please take Prednisone Dose Pak per instructions. Please take OTC Claritin or Allegra in am, OTC Benadryl in the evening. OTC Hydrocortisone 1% cream topical as needed. If symptoms persist after Prednisone completed, please call clinic. Continue to social distance and wear a mask when in clinic. FEEL BETTER!

## 2019-01-07 ENCOUNTER — Emergency Department (HOSPITAL_COMMUNITY)
Admission: EM | Admit: 2019-01-07 | Discharge: 2019-01-07 | Disposition: A | Discharge status: Home / Self Care | Payer: 59 | Attending: Emergency Medicine | Admitting: Emergency Medicine

## 2019-01-07 ENCOUNTER — Other Ambulatory Visit: Payer: Self-pay | Admitting: Internal Medicine

## 2019-01-07 ENCOUNTER — Emergency Department (HOSPITAL_COMMUNITY): Payer: 59

## 2019-01-07 ENCOUNTER — Encounter (HOSPITAL_COMMUNITY): Payer: Self-pay | Admitting: Emergency Medicine

## 2019-01-07 ENCOUNTER — Other Ambulatory Visit: Payer: Self-pay

## 2019-01-07 ENCOUNTER — Telehealth: Payer: Self-pay | Admitting: Adult Health

## 2019-01-07 DIAGNOSIS — Z87891 Personal history of nicotine dependence: Secondary | ICD-10-CM | POA: Insufficient documentation

## 2019-01-07 DIAGNOSIS — F1210 Cannabis abuse, uncomplicated: Secondary | ICD-10-CM | POA: Diagnosis not present

## 2019-01-07 DIAGNOSIS — R0600 Dyspnea, unspecified: Secondary | ICD-10-CM | POA: Diagnosis not present

## 2019-01-07 DIAGNOSIS — R0602 Shortness of breath: Secondary | ICD-10-CM

## 2019-01-07 DIAGNOSIS — R079 Chest pain, unspecified: Secondary | ICD-10-CM | POA: Diagnosis not present

## 2019-01-07 DIAGNOSIS — Z20828 Contact with and (suspected) exposure to other viral communicable diseases: Secondary | ICD-10-CM | POA: Diagnosis not present

## 2019-01-07 DIAGNOSIS — R05 Cough: Secondary | ICD-10-CM | POA: Insufficient documentation

## 2019-01-07 DIAGNOSIS — Z8673 Personal history of transient ischemic attack (TIA), and cerebral infarction without residual deficits: Secondary | ICD-10-CM | POA: Insufficient documentation

## 2019-01-07 DIAGNOSIS — Z20822 Contact with and (suspected) exposure to covid-19: Secondary | ICD-10-CM

## 2019-01-07 DIAGNOSIS — Z79899 Other long term (current) drug therapy: Secondary | ICD-10-CM | POA: Insufficient documentation

## 2019-01-07 DIAGNOSIS — R6889 Other general symptoms and signs: Secondary | ICD-10-CM | POA: Diagnosis not present

## 2019-01-07 LAB — BASIC METABOLIC PANEL
Anion gap: 11 (ref 5–15)
BUN: 11 mg/dL (ref 6–20)
CO2: 21 mmol/L — ABNORMAL LOW (ref 22–32)
Calcium: 9.4 mg/dL (ref 8.9–10.3)
Chloride: 106 mmol/L (ref 98–111)
Creatinine, Ser: 1.01 mg/dL (ref 0.61–1.24)
GFR calc Af Amer: >60 mL/min (ref >60)
GFR calc non Af Amer: >60 mL/min (ref >60)
Glucose, Bld: 140 mg/dL — ABNORMAL HIGH (ref 70–99)
Potassium: 4.1 mmol/L (ref 3.5–5.1)
Sodium: 138 mmol/L (ref 135–145)

## 2019-01-07 LAB — CBC
HCT: 42.7 % (ref 39.0–52.0)
Hemoglobin: 14.8 g/dL (ref 13.0–17.0)
MCH: 31.4 pg (ref 26.0–34.0)
MCHC: 34.7 g/dL (ref 30.0–36.0)
MCV: 90.5 fL (ref 80.0–100.0)
Platelets: 241 10*3/uL (ref 150–400)
RBC: 4.72 MIL/uL (ref 4.22–5.81)
RDW: 11.7 % (ref 11.5–15.5)
WBC: 10 10*3/uL (ref 4.0–10.5)
nRBC: 0 % (ref 0.0–0.2)

## 2019-01-07 LAB — TROPONIN I (HIGH SENSITIVITY)
Troponin I (High Sensitivity): <2 ng/L (ref <18)
Troponin I (High Sensitivity): <2 ng/L (ref <18)

## 2019-01-07 LAB — SARS CORONAVIRUS 2 BY RT PCR (HOSPITAL ORDER, PERFORMED IN ~~LOC~~ HOSPITAL LAB): SARS Coronavirus 2: NEGATIVE

## 2019-01-07 MED ORDER — OMEPRAZOLE 20 MG PO CPDR: 20.0000 mg | DELAYED_RELEASE_CAPSULE | Freq: Every day | ORAL | 0 refills | Status: DC

## 2019-01-07 NOTE — Telephone Encounter (Signed)
Patient was seen yesterday for acute OV, he called today and left a VM complaining of chest tightness from the chronic cough (which has not gotten any better) and wants to know if there is something he should be doing to help. Please advise

## 2019-01-07 NOTE — ED Triage Notes (Signed)
Per pt, states he woke up this am with chest tightness and SOB-states he has a "patch" between the 2 walls of his heart-CVA last year-PCP tod him to come to ED for evaluation

## 2019-01-07 NOTE — ED Notes (Signed)
Pt d/c home per MD order. Discharge summary reviewed, pt verbalizes understanding. RX provided. Ambulatory, no s/s acute distress noted. Pt voicing no complaints at discharge.

## 2019-01-07 NOTE — Discharge Instructions (Signed)
Take the antacid medications as prescribed.  Follow-up with your primary care doctor.  Return to the ER for worsening symptoms.

## 2019-01-07 NOTE — ED Provider Notes (Signed)
[facility] DEPT Provider Note   CSN: [id number] Arrival date & time: 01/07/19  1128    History   Chief Complaint Chief Complaint  Patient presents with  . Chest Pain    HPI NATANIEL GASPER is a 48 y.o. male.  HPI: A 48 year old patient with a history of CVA and hypertension presents for evaluation of chest pain. Initial onset of pain was approximately 3-6 hours ago. The patient's chest pain is described as heaviness/pressure/tightness and is not worse with exertion. The patient's chest pain is middle- or left-sided, is not well-localized, is not sharp and does not radiate to the arms/jaw/neck. The patient does not complain of nausea and denies diaphoresis. The patient has no history of peripheral artery disease, has not smoked in the past 90 days, denies any history of treated diabetes, has no relevant family history of coronary artery disease (first degree relative at less than age 29), has no history of hypercholesterolemia and does not have an elevated BMI (>=30).   HPI Patient has a history of a patent foramen ovale and stroke.  Patient states yesterday he saw his doctor for a rash that he developed recently.  He was diagnosed with a contact dermatitis and was started on prednisone and over-the-counter antihistamines.  Patient states when he woke up this morning he was having some tightness in his chest as well as difficulty breathing.  He has had a chronic cough recently.  Patient decided to go for COVID testing but after speaking with his doctor they suggested he go to the emergency room for evaluation.  He denies any fevers.  He denies any myalgias.  No vomiting or diarrhea.  Patient does not have a history of coronary artery disease. Past Medical History:  Diagnosis Date  . Depression   . High cholesterol   . PFO (patent foramen ovale)    a. s/p PFO closure by Dr. Burt Knack on 11/19/17  . Stroke [facility])     Patient Active Problem List   Diagnosis  Date Noted  . Contact dermatitis 01/06/2019  . Vitamin D deficiency 06/01/2018  . High cholesterol 02/09/2018  . 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 On statin therapy 08/25/2017  . Healthcare maintenance 07/11/2017  . Depression with anxiety 06/27/2017  . Cerebrovascular accident (CVA) ([place]) 06/27/2017  . Acute ischemic stroke ([place]) 06/26/2017  . Elevated blood-pressure reading without diagnosis of hypertension 06/26/2017  . NEVUS 02/13/2007    Past Surgical History:  Procedure Laterality Date  . big toe amputation Right   . CARPAL TUNNEL RELEASE    . LOOP RECORDER INSERTION N/A 09/25/2017   Procedure: LOOP RECORDER INSERTION;  Surgeon: [name], MD;  Location: [place] [facility] LAB;  Service: Cardiovascular;  Laterality: N/A;  . MOUTH SURGERY    . PATENT FORAMEN OVALE(PFO) CLOSURE N/A 11/19/2017   Procedure: PATENT FORAMEN OVALE (PFO) CLOSURE;  Surgeon: [name], MD;  Location: [place] [facility] LAB;  Service: Cardiovascular;  Laterality: N/A;  . spider bite    . TEE WITHOUT CARDIOVERSION N/A 09/25/2017   Procedure: TRANSESOPHAGEAL ECHOCARDIOGRAM (TEE) WITH LOOP;  Surgeon: Pixie Casino, MD;  Location: [facility] ENDOS[facility];  Service: Cardiovascular;  Laterality: N/A;  . VASECTOMY          Home Medications    Prior to Admission medications   Medication Sig Start Date End Date Taking? Authorizing Provider  aspirin EC 81 MG tablet Take 81 mg by mouth daily with lunch.     [provider]  atorvastatin (LIPITOR) 40 MG tablet TAKE 1 TABLET BY MOUTH ONCE DAILY AT 6 PM Patient taking differently: Take 40 mg by mouth daily at 6 PM.  11/26/18   Danford, Valetta Fuller D, NP  lamoTRIgine (LAMICTAL) 100 MG tablet Take 1 tablet (100 mg total) by mouth daily. 12/22/18   Arfeen, Arlyce Harman, MD  Menthol, Topical Analgesic, (ICY HOT EX) Apply 1 application topically 3 (three) times daily as needed (pain).     [provider]  Multiple Vitamin (MULTIVITAMIN WITH MINERALS) TABS tablet  Take 1 tablet by mouth daily with lunch.    [provider]  naproxen sodium (ALEVE) 220 MG tablet Take 220-440 mg by mouth 3 (three) times daily as needed (pain/headaches).    [provider]  omega-3 acid ethyl esters (LOVAZA) 1 g capsule Take 2 capsules (2 g total) by mouth 2 (two) times daily. 11/03/17   Danford, Valetta Fuller D, NP  omeprazole (PRILOSEC) 20 MG capsule Take 1 capsule (20 mg total) by mouth daily. 01/07/19   Dorie Rank, MD  predniSONE (STERAPRED UNI-PAK 21 TAB) 10 MG (21) TBPK tablet Take per [facility] Instructions 01/06/19   Danford, Valetta Fuller D, NP  QUEtiapine (SEROQUEL) 100 MG tablet Take 1 tablet (100 mg total) by mouth at bedtime. 12/22/18   Arfeen, Arlyce Harman, MD  Vitamin D, Ergocalciferol, (DRISDOL) 1.25 MG (50000 UT) CAPS capsule Take 1 capsule (50,000 Units total) by mouth every 7 (seven) days. 08/18/18   [name], NP    Family History Family History  Problem Relation Age of Onset  . Stroke Father        disabling CVA in early-mid 50's  . Hypertension Father   . Alcohol abuse Father   . Heart disease Mother   . Dementia Mother   . Atrial fibrillation Mother   . Drug abuse Sister   . Hypertension Brother   . Healthy Sister     Social History Social History   Tobacco Use  . Smoking status: Former Smoker    Packs/day: 0.25    Years: 28.00    Pack years: 7.00    Types: Cigarettes    Quit date: 07/25/2017    Years since quitting: 1.4  . Smokeless tobacco: Never Used  Substance Use Topics  . Alcohol use: Yes    Alcohol/week: 6.0 standard drinks    Types: 6 Cans of beer per week    Comment: 6 pack nightly  . Drug use: Yes    Types: Marijuana    Comment: takes daily for pain leisure      Allergies   Ibuprofen and Penicillins   Review of Systems Review of Systems  All other systems reviewed and are negative.    Physical Exam Updated Vital Signs BP 129/80   Pulse 79   Temp 98.3 F (36.8 C) (Oral)   Resp 15   SpO2 97%   Physical Exam  Vitals signs and nursing note reviewed.  Constitutional:      General: He is not in acute distress.    Appearance: He is well-developed.  HENT:     Head: Normocephalic and atraumatic.     Right Ear: External ear normal.     Left Ear: External ear normal.  Eyes:     General: No scleral icterus.       Right eye: No discharge.        Left eye: No discharge.     Conjunctiva/sclera: Conjunctivae normal.  Neck:     Musculoskeletal:  Neck supple.     Trachea: No tracheal deviation.  Cardiovascular:     Rate and Rhythm: Normal rate and regular rhythm.  Pulmonary:     Effort: Pulmonary effort is normal. No respiratory distress.     Breath sounds: Normal breath sounds. No stridor. No wheezing or rales.  Abdominal:     General: Bowel sounds are normal. There is no distension.     Palpations: Abdomen is soft.     Tenderness: There is no abdominal tenderness. There is no guarding or rebound.  Musculoskeletal:        General: No tenderness.  Skin:    General: Skin is warm and dry.     Coloration: Skin is not jaundiced or pale.     Findings: Rash present. Rash is macular and papular. Rash is not urticarial.  Neurological:     Mental Status: He is alert.     Cranial Nerves: No cranial nerve deficit (no facial droop, extraocular movements intact, no slurred speech).     Sensory: No sensory deficit.     Motor: No abnormal muscle tone or seizure activity.     Coordination: Coordination normal.      ED Treatments / Results  Labs (all labs ordered are listed, but only abnormal results are displayed) Labs Reviewed  BASIC METABOLIC PANEL - Abnormal; Notable for the following components:      Result Value   CO2 21 (*)    Glucose, Bld 140 (*)    All other components within normal limits  SARS CORONAVIRUS 2 (HOSPITAL ORDER, [place] [facility])  CBC  TROPONIN I (HIGH SENSITIVITY)  TROPONIN I (HIGH SENSITIVITY)    EKG EKG Interpretation  Date/Time:  Thursday January 07 2019 12:03:47 EDT Ventricular Rate:  74 PR Interval:    QRS Duration: 107 QT Interval:  404 QTC Calculation: 449 R Axis:   97 Text Interpretation:  Sinus rhythm Borderline right axis deviation No STEMI  Confirmed by Nanda Quinton [phone]) on 01/07/2019 12:14:34 PM   Radiology Dg Chest Port 1 View  Result Date: 01/07/2019 CLINICAL DATA:  Short of breath EXAM: PORTABLE CHEST 1 VIEW COMPARISON:  None. FINDINGS: Heart size and vascularity normal.  Loop recorder noted. Lungs well aerated without infiltrate effusion or mass. IMPRESSION: No active disease. Electronically Signed   By: Franchot Gallo M.D.   On: 01/07/2019 13:33    Procedures Procedures (including critical care time)  Medications Ordered in ED Medications - No data to display   Initial Impression / Assessment and Plan / ED Course  I have reviewed the triage vital signs and the nursing notes.  Pertinent labs & imaging results that were available during my care of the patient were reviewed by me and considered in my medical decision making (see chart for details).     HEAR Score: 4 Patient presented to the emergency room for chest discomfort.  Has a history of patent foramen ovale but no known history of coronary artery disease.  Patient symptoms all started after taking a course of steroids.  His EKG and serial troponins are reassuring.  Heart score is in the moderate range but I overall low suspicion for acute coronary syndrome.  He isnot tachypneic and is PERC negative.  His symptoms all started after a course of steroids.  It is possible that has induced some gastroesophageal reflux.  I will have him start on some antacids and I feel he is stable for close outpatient follow-up.   Legrand Como  STANFORD STRAUCH was evaluated in Emergency Department on 01/07/2019 for the symptoms described in the history of present illness. He was evaluated in the context of the global COVID-19 pandemic, which necessitated consideration that the patient  might be at risk for infection with the SARS-CoV-2 virus that causes COVID-19. Institutional protocols and algorithms that pertain to the evaluation of patients at risk for COVID-19 are in a state of rapid change based on information released by regulatory bodies including the CDC and federal and state organizations. These policies and algorithms were followed during the patient's care in the ED.   Final Clinical Impressions(s) / ED Diagnoses   Final diagnoses:  Chest pain, unspecified type    ED Discharge Orders         Ordered    omeprazole (PRILOSEC) 20 MG capsule  Daily     01/07/19 1652           Dorie Rank, MD 01/07/19 1655

## 2019-01-07 NOTE — Telephone Encounter (Signed)
Pt c/o CP and SOB, but denies arm pain or neck pain.  Pt is being tested for COVID today and is currently sitting in line at [facility], however given his significant cardiovascular history, advised pt that he needs to be seen at the ED for evaluation immediately after being tested.  Advised pt to go to [facility].  Pt expressed understanding and is agreeable.  Charyl Bigger, CMA

## 2019-01-08 MED FILL — OMEPRAZOLE 20 MG CPDR: 20 | 14 days supply | Qty: 14 | Fill #0

## 2019-01-13 LAB — NOVEL CORONAVIRUS, NAA: SARS-CoV-2, NAA: NOT DETECTED

## 2019-01-21 MED FILL — SUBVENITE 100 MG TABS: 100 | 30 days supply | Qty: 30 | Fill #1

## 2019-02-08 ENCOUNTER — Encounter: Payer: Self-pay | Admitting: Adult Health

## 2019-02-08 ENCOUNTER — Ambulatory Visit (INDEPENDENT_AMBULATORY_CARE_PROVIDER_SITE_OTHER): Payer: 59 | Admitting: Adult Health

## 2019-02-08 ENCOUNTER — Other Ambulatory Visit: Payer: Self-pay

## 2019-02-08 DIAGNOSIS — E782 Mixed hyperlipidemia: Secondary | ICD-10-CM

## 2019-02-08 DIAGNOSIS — E781 Pure hyperglyceridemia: Secondary | ICD-10-CM | POA: Diagnosis not present

## 2019-02-08 DIAGNOSIS — E559 Vitamin D deficiency, unspecified: Secondary | ICD-10-CM

## 2019-02-08 DIAGNOSIS — F418 Other specified anxiety disorders: Secondary | ICD-10-CM | POA: Diagnosis not present

## 2019-02-08 DIAGNOSIS — Z0000 Encounter for general adult medical examination without abnormal findings: Secondary | ICD-10-CM

## 2019-02-08 DIAGNOSIS — Z79899 Other long term (current) drug therapy: Secondary | ICD-10-CM | POA: Diagnosis not present

## 2019-02-08 NOTE — Assessment & Plan Note (Signed)
Atorvastatin 40mg  QD, Lovaza 1g-2 capsules BID REDUCE TO STOP ETOH USE MAX 2 Drinks/day  Fasting labs this week- Lipids, Direct LDL, hepatic fx, Vit D

## 2019-02-08 NOTE — Assessment & Plan Note (Signed)
Assessment and Plan: Continue all medications as directed. Strongly advised to reduce ETOH use: 0-2 drinks/day Follow Heart Healthy Diet Continue to abstain from tobacco/marijuana use Continue close follow-up with Psychiatry Continue to social distance and wear a mask when in public  Follow Up Instructions: Fasting labs this week- Lipids, Direct LDL, Vit D OV 4 months   I discussed the assessment and treatment plan with the patient. The patient was provided an opportunity to ask questions and all were answered. The patient agreed with the plan and demonstrated an understanding of the instructions.

## 2019-02-08 NOTE — Assessment & Plan Note (Signed)
He is followed by Psychiatry Q3M He reports stable mood, denies SI/HI He is currently on Seroquel 100mg  QD, Lamictal 100mg  QD- he has been weaned off Clonazepam

## 2019-02-08 NOTE — Progress Notes (Signed)
Virtual Visit via Telephone Note  I connected with Steven Herrera on 02/08/19 at  1:45 PM EDT by telephone and verified that I am speaking with the correct person using two identifiers.  Location: Patient: Home Provider: In Clinic   I discussed the limitations, risks, security and privacy concerns of performing an evaluation and management service by telephone and the availability of in person appointments. I also discussed with the patient that there may be a patient responsible charge related to this service. The patient expressed understanding and agreed to proceed.   History of Present Illness: 08/10/2018 OV: Steven Herrera is here for CPE He reports medication compliance, denies SE He estimates to drink  >60 oz water a day and has been trying to limit saturated fat in his diet He remains active on "on my 32 acres at home" He continues to abstain from tobacco and has dramatically reduced marijuana use- once/month He has finally established with Psychiatrist- medications adjusted and followed monthly currently. He has seen therapist a few times, but "isn't jiving with them". He denies thoughts of harming himself/others  02/08/2019 OV: Steven Herrera calls in today regular f/u: HLD, Depression/Anxiety, Vit D Def He is followed by Psychiatry Q3M He reports stable mood, denies SI/HI He is currently on Seroquel 100mg  QD, Lamictal 100mg  QD- he has been weaned off Clonazepam  He has been taking Atorvastatin and Lovaza as directed He continues to drink ETOH: 3-6 beers/night- advised that he needs to keep ETOH use to 0-2/night He denies RUQ pain He states "I try to eat healthy", and remains active with house/yard work He denies acute complaints today  He was seen at local ED 01/07/2019- CP EKG- SR R Axis Deviation, No STEMI CXR- No Active Disease Troponins-negative SARS-CoV-2 virus Negative   Needs fasting labs   Patient Care Team    Relationship Specialty Notifications Start End   Mina Marble D, NP PCP - General Family Medicine  07/11/17   Garvin Fila, MD Consulting Physician Neurology  07/11/17   [facility]    08/25/17     Patient Active Problem List   Diagnosis Date Noted  . Hypertriglyceridemia 02/08/2019  . Contact dermatitis 01/06/2019  . Vitamin D deficiency 06/01/2018  . High cholesterol 02/09/2018  . 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 On statin therapy 08/25/2017  . Healthcare maintenance 07/11/2017  . Depression with anxiety 06/27/2017  . Cerebrovascular accident (CVA) ([place]) 06/27/2017  . Acute ischemic stroke ([place]) 06/26/2017  . Elevated blood-pressure reading without diagnosis of hypertension 06/26/2017  . NEVUS 02/13/2007     Past Medical History:  Diagnosis Date  . Depression   . High cholesterol   . PFO (patent foramen ovale)    a. s/p PFO closure by Dr. Burt Knack on 11/19/17  . Stroke [facility])      Past Surgical History:  Procedure Laterality Date  . big toe amputation Right   . CARPAL TUNNEL RELEASE    . LOOP RECORDER INSERTION N/A 09/25/2017   Procedure: LOOP RECORDER INSERTION;  Surgeon: [name], MD;  Location: [place] [facility];  Service: Cardiovascular;  Laterality: N/A;  . MOUTH SURGERY    . PATENT FORAMEN OVALE(PFO) CLOSURE N/A 11/19/2017   Procedure: PATENT FORAMEN OVALE (PFO) CLOSURE;  Surgeon: [name], MD;  Location: [place] [facility];  Service: Cardiovascular;  Laterality: N/A;  . spider bite    . TEE WITHOUT CARDIOVERSION N/A 09/25/2017   Procedure: TRANSESOPHAGEAL ECHOCARDIOGRAM (TEE) WITH LOOP;  Surgeon: Pixie Casino, MD;  Location: [facility] ENDOSCOPY;  Service: Cardiovascular;  Laterality: N/A;  . VASECTOMY       Family History  Problem Relation Age of Onset  . Stroke Father        disabling CVA in early-mid 50's  . Hypertension Father   . Alcohol abuse Father   . Heart disease Mother   . Dementia Mother   . Atrial fibrillation Mother   . Drug abuse Sister   .  Hypertension Brother   . Healthy Sister      Social History   Substance and Sexual Activity  Drug Use Yes  . Types: Marijuana   Comment: takes daily for pain leisure      Social History   Substance and Sexual Activity  Alcohol Use Yes  . Alcohol/week: 6.0 standard drinks  . Types: 6 Cans of beer per week   Comment: 6 pack nightly     Social History   Tobacco Use  Smoking Status Former Smoker  . Packs/day: 0.25  . Years: 28.00  . Pack years: 7.00  . Types: Cigarettes  . Quit date: 07/25/2017  . Years since quitting: 1.5  Smokeless Tobacco Never Used     Outpatient Encounter Medications as of 02/08/2019  Medication Sig  . aspirin EC 81 MG tablet Take 81 mg by mouth daily with lunch.   Marland Kitchen atorvastatin (LIPITOR) 40 MG tablet TAKE 1 TABLET BY MOUTH ONCE DAILY AT 6 PM (Patient taking differently: Take 40 mg by mouth daily at 6 PM. )  . lamoTRIgine (LAMICTAL) 100 MG tablet Take 1 tablet (100 mg total) by mouth daily.  . Menthol, Topical Analgesic, (ICY HOT EX) Apply 1 application topically 3 (three) times daily as needed (pain).   . Multiple Vitamin (MULTIVITAMIN WITH MINERALS) TABS tablet Take 1 tablet by mouth daily with lunch.  . naproxen sodium (ALEVE) 220 MG tablet Take 220-440 mg by mouth 3 (three) times daily as needed (pain/headaches).  Marland Kitchen omega-3 acid ethyl esters (LOVAZA) 1 g capsule Take 2 capsules (2 g total) by mouth 2 (two) times daily.  Marland Kitchen omeprazole (PRILOSEC) 20 MG capsule Take 1 capsule (20 mg total) by mouth daily.  . QUEtiapine (SEROQUEL) 100 MG tablet Take 1 tablet (100 mg total) by mouth at bedtime.  . [DISCONTINUED] predniSONE (STERAPRED UNI-PAK 21 TAB) 10 MG (21) TBPK tablet Take per [employer]  . [DISCONTINUED] Vitamin D, Ergocalciferol, (DRISDOL) 1.25 MG (50000 UT) CAPS capsule Take 1 capsule (50,000 Units total) by mouth every 7 (seven) days.   No facility-administered encounter medications on file as of 02/08/2019.     Allergies: Ibuprofen  and Penicillins  There is no height or weight on file to calculate BMI.  There were no vitals taken for this visit. Review of Systems: General:   Denies fever, chills, unexplained weight loss.  Optho/Auditory:   Denies visual changes, blurred vision/LOV Respiratory:   Denies SOB, DOE more than baseline levels.  Cardiovascular:   Denies chest pain, palpitations, new onset peripheral edema  Gastrointestinal:   Denies nausea, vomiting, diarrhea.  Genitourinary: Denies dysuria, freq/ urgency, flank pain or discharge from genitals.  Endocrine:     Denies hot or cold intolerance, polyuria, polydipsia. Musculoskeletal:   Denies unexplained myalgias, joint swelling, unexplained arthralgias, gait problems.  Skin:  Denies rash, suspicious lesions Neurological:     Denies dizziness, unexplained weakness, numbness  Psychiatric/Behavioral:   Denies mood changes, suicidal or homicidal ideations, hallucinations    Observations/Objective: No acute  distress noted distress during the telephone conversation  Assessment and Plan: Continue all medications as directed. Strongly advised to reduce ETOH use: 0-2 drinks/day Follow Heart Healthy Diet Continue to abstain from tobacco/marijuana use Continue close follow-up with Psychiatry Continue to social distance and wear a mask when in public  Follow Up Instructions: Fasting labs this week- Lipids, Direct LDL, hepatic fx, Vit D OV 4 months   I discussed the assessment and treatment plan with the patient. The patient was provided an opportunity to ask questions and all were answered. The patient agreed with the plan and demonstrated an understanding of the instructions.   The patient was advised to call back or seek an in-person evaluation if the symptoms worsen or if the condition fails to improve as anticipated.  I provided 15 minutes of non-face-to-face time during this encounter.   [name], NP

## 2019-02-08 NOTE — Assessment & Plan Note (Signed)
Atorvastatin 40mg  QD Fasting labs this week- Lipids, Direct LDL, hepatic fx, Vit D

## 2019-02-10 ENCOUNTER — Other Ambulatory Visit: Payer: Self-pay

## 2019-02-10 ENCOUNTER — Other Ambulatory Visit: Payer: 59

## 2019-02-10 DIAGNOSIS — E7800 Pure hypercholesterolemia, unspecified: Secondary | ICD-10-CM

## 2019-02-10 DIAGNOSIS — E559 Vitamin D deficiency, unspecified: Secondary | ICD-10-CM | POA: Diagnosis not present

## 2019-02-10 DIAGNOSIS — E782 Mixed hyperlipidemia: Secondary | ICD-10-CM

## 2019-02-10 DIAGNOSIS — Z79899 Other long term (current) drug therapy: Secondary | ICD-10-CM | POA: Diagnosis not present

## 2019-02-11 LAB — LIPID PANEL
Chol/HDL Ratio: 5.4 ratio — ABNORMAL HIGH (ref 0.0–5.0)
Cholesterol, Total: 145 mg/dL (ref 100–199)
HDL: 27 mg/dL — ABNORMAL LOW (ref >39)
LDL Calculated: 40 mg/dL (ref 0–99)
Triglycerides: 392 mg/dL — ABNORMAL HIGH (ref 0–149)
VLDL Cholesterol Cal: 78 mg/dL — ABNORMAL HIGH (ref 5–40)

## 2019-02-11 LAB — HEPATIC FUNCTION PANEL
ALT: 44 IU/L (ref 0–44)
AST: 30 IU/L (ref 0–40)
Albumin: 4 g/dL (ref 4.0–5.0)
Alkaline Phosphatase: 80 IU/L (ref 39–117)
Bilirubin Total: 0.4 mg/dL (ref 0.0–1.2)
Bilirubin, Direct: 0.13 mg/dL (ref 0.00–0.40)
Total Protein: 6.7 g/dL (ref 6.0–8.5)

## 2019-02-11 LAB — LDL CHOLESTEROL, DIRECT: LDL Direct: 57 mg/dL (ref 0–99)

## 2019-02-11 LAB — VITAMIN D 25 HYDROXY (VIT D DEFICIENCY, FRACTURES): Vit D, 25-Hydroxy: 30.2 ng/mL (ref 30.0–100.0)

## 2019-02-24 ENCOUNTER — Other Ambulatory Visit: Payer: Self-pay | Admitting: Adult Health

## 2019-02-24 MED FILL — ATORVASTATIN 40 MG TABLET: 40 | 90 days supply | Qty: 90 | Fill #0

## 2019-02-24 MED FILL — LAMOTRIGINE 100 MG TABS: 100 | 30 days supply | Qty: 30 | Fill #2

## 2019-02-25 ENCOUNTER — Other Ambulatory Visit: Payer: Self-pay | Admitting: Adult Health

## 2019-02-26 MED FILL — OMEGA-3 ETHYL ESTERS 1 GM C: 1 | 60 days supply | Qty: 240 | Fill #0

## 2019-03-08 ENCOUNTER — Other Ambulatory Visit: Payer: Self-pay

## 2019-03-08 NOTE — Telephone Encounter (Signed)
We have not prescribed these medications for the patient previously.  Please review and refill if appropriate.  T. Brylea Pita, CMA  

## 2019-03-09 MED ORDER — OMEPRAZOLE 20 MG PO CPDR: 20.0000 mg | DELAYED_RELEASE_CAPSULE | Freq: Every day | ORAL | 0 refills | Status: DC

## 2019-03-09 MED FILL — OMEPRAZOLE 20 MG CAP: 20 | 90 days supply | Qty: 90 | Fill #0

## 2019-03-19 ENCOUNTER — Encounter: Payer: Self-pay | Admitting: Adult Health

## 2019-03-24 ENCOUNTER — Encounter (HOSPITAL_COMMUNITY): Payer: Self-pay | Admitting: Psychiatry

## 2019-03-24 ENCOUNTER — Other Ambulatory Visit: Payer: Self-pay

## 2019-03-24 ENCOUNTER — Ambulatory Visit (INDEPENDENT_AMBULATORY_CARE_PROVIDER_SITE_OTHER): Payer: 59 | Admitting: Psychiatry

## 2019-03-24 VITALS — Wt 214.0 lb

## 2019-03-24 DIAGNOSIS — F1210 Cannabis abuse, uncomplicated: Secondary | ICD-10-CM | POA: Diagnosis not present

## 2019-03-24 DIAGNOSIS — F4310 Post-traumatic stress disorder, unspecified: Secondary | ICD-10-CM | POA: Diagnosis not present

## 2019-03-24 DIAGNOSIS — F1010 Alcohol abuse, uncomplicated: Secondary | ICD-10-CM

## 2019-03-24 DIAGNOSIS — F319 Bipolar disorder, unspecified: Secondary | ICD-10-CM | POA: Diagnosis not present

## 2019-03-24 MED ORDER — QUETIAPINE FUMARATE 100 MG PO TABS: 100.0000 mg | ORAL_TABLET | Freq: Every day | ORAL | 0 refills | Status: DC

## 2019-03-24 MED ORDER — LAMOTRIGINE 100 MG PO TABS: 100.0000 mg | ORAL_TABLET | Freq: Every day | ORAL | 0 refills | Status: DC

## 2019-03-24 MED FILL — QUETIAPINE FUMARATE 100 MG: 100 | 90 days supply | Qty: 90 | Fill #0

## 2019-03-24 MED FILL — SUBVENITE 100 MG TABS: 100 | 90 days supply | Qty: 90 | Fill #0

## 2019-03-24 NOTE — Progress Notes (Signed)
Virtual Visit via Telephone Note  I connected with Steven Herrera on 03/24/19 at 11:20 AM EDT by telephone and verified that I am speaking with the correct person using two identifiers.   I discussed the limitations, risks, security and privacy concerns of performing an evaluation and management service by telephone and the availability of in person appointments. I also discussed with the patient that there may be a patient responsible charge related to this service. The patient expressed understanding and agreed to proceed.   History of Present Illness: Patient was evaluated by phone session.  He is compliant with Seroquel and Lamictal.  He was seen in the emergency room few weeks ago because of the chest pain but find out that he has acid reflux.  He is taking now acid reflux medication.  He has mild tremors but they are not as bad.  Patient also recall few weeks ago had a rash in his hand but it went away on his own.  Patient lives with his wife who is supportive.  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  He has been very busy and he feels good about it.  He admitted continues to drink beer on the weekend and now started smoke marijuana again at night to help his pain.  However he denies any intoxication, binge or any blackouts.  He feels the current medicine is working.  He has no nightmares or flashbacks.  He reported his mood is stable and denies any agitation, anger, mania or any psychosis.  He is not interested in therapy.  Denies any suicidal thoughts.  Since taking the medication he has no major outbursts or anger.  His appetite is okay.  He reported his weight is stable.   Past Psychiatric History:Reviewed. H/O briefly seeing therapist at [facility] in young age. H/O mood swing, anger, agitation, hyperactivity and running away. Used acid, LSD, cocaine, marijuana and heavy drinking. Tried Zoloft, Lexapro and Xanax did not like it. Took Paxil and Klonopin  and Wellbutrin from PCP.  Recent Results (from the past 2160 hour(s))  Novel Coronavirus, NAA ([facility])  Drive up testing site only     Status: None   Collection Time: 01/07/19 10:52 AM  Result Value Ref Range   SARS-CoV-2, NAA Not Detected Not Detected    Comment: This test was developed and its performance characteristics determined by [employer]. This test has not been FDA cleared or approved. This test has been authorized by FDA under an Emergency Use Authorization (EUA). This test is only authorized for the duration of time the declaration that circumstances exist justifying the authorization of the emergency use of in vitro diagnostic tests for detection of SARS-CoV-2 virus and/or diagnosis of COVID-19 infection under section 564(b)(1) of the Act, 21 U.S.C. [id number]), unless the authorization is terminated or revoked sooner. When diagnostic testing is negative, the possibility of a false negative result should be considered in the context of a patient's recent exposures and the presence of clinical signs and symptoms consistent with COVID-19. An individual without symptoms of COVID-19 and who is not shedding SARS-CoV-2 virus would expect to have a negative (not detected) result in this assay.   Basic metabolic panel     Status: Abnormal   Collection Time: 01/07/19 12:25 PM  Result Value Ref Range   Sodium 138 135 - 145 mmol/L   Potassium 4.1 3.5 - 5.1 mmol/L   Chloride 106 98 - 111 mmol/L   CO2 21 (  L) 22 - 32 mmol/L   Glucose, Bld 140 (H) 70 - 99 mg/dL   BUN 11 6 - 20 mg/dL   Creatinine, Ser 1.01 0.61 - 1.24 mg/dL   Calcium 9.4 8.9 - 10.3 mg/dL   GFR calc non Af Amer >60 >60 mL/min   GFR calc Af Amer >60 >60 mL/min   Anion gap 11 5 - 15    Comment: Performed at [facility], [place] [address] [place], [place] [address]  CBC     Status: None   Collection Time: 01/07/19 12:25 PM  Result Value Ref Range   WBC 10.0 4.0 - 10.5 K/uL   RBC 4.72  4.22 - 5.81 MIL/uL   Hemoglobin 14.8 13.0 - 17.0 g/dL   HCT 42.7 39.0 - 52.0 %   MCV 90.5 80.0 - 100.0 fL   MCH 31.4 26.0 - 34.0 pg   MCHC 34.7 30.0 - 36.0 g/dL   RDW 11.7 11.5 - 15.5 %   Platelets 241 150 - 400 K/uL   nRBC 0.0 0.0 - 0.2 %    Comment: Performed at [facility], [place] [address] [place], [place] [address]  Troponin I (High Sensitivity)     Status: None   Collection Time: 01/07/19 12:25 PM  Result Value Ref Range   Troponin I (High Sensitivity) <2 <18 ng/L    Comment: Performed at [facility], [place] [address], [place],  [address]  SARS Coronavirus 2 (CEPHEID - Performed in [place] hospital lab), Hosp Order     Status: None   Collection Time: 01/07/19  2:31 PM   Specimen: Nasopharyngeal Swab  Result Value Ref Range   SARS Coronavirus 2 NEGATIVE NEGATIVE    Comment: (NOTE) If result is NEGATIVE SARS-CoV-2 target nucleic acids are NOT DETECTED. The SARS-CoV-2 RNA is generally detectable in upper and lower  respiratory specimens during the acute phase of infection. The lowest  concentration of SARS-CoV-2 viral copies this assay can detect is 250  copies / mL. A negative result does not preclude SARS-CoV-2 infection  and should not be used as the sole basis for treatment or other  patient management decisions.  A negative result may occur with  improper specimen collection / handling, submission of specimen other  than nasopharyngeal swab, presence of viral mutation(s) within the  areas targeted by this assay, and inadequate number of viral copies  (<250 copies / mL). A negative result must be combined with clinical  observations, patient history, and epidemiological information. If result is POSITIVE SARS-CoV-2 target nucleic acids are DETECTED. The SARS-CoV-2 RNA is generally detectable in upper and lower  respiratory specimens dur ing the acute phase of infection.  Positive  results are indicative of active infection  with SARS-CoV-2.  Clinical  correlation with patient history and other diagnostic information is  necessary to determine patient infection status.  Positive results do  not rule out bacterial infection or co-infection with other viruses. If result is PRESUMPTIVE POSTIVE SARS-CoV-2 nucleic acids MAY BE PRESENT.   A presumptive positive result was obtained on the submitted specimen  and confirmed on repeat testing.  While 2019 novel coronavirus  (SARS-CoV-2) nucleic acids may be present in the submitted sample  additional confirmatory testing may be necessary for epidemiological  and / or clinical management purposes  to differentiate between  SARS-CoV-2 and other Sarbecovirus currently known to infect humans.  If clinically indicated additional testing with an alternate test  methodology [phone]) is advised.  The SARS-CoV-2 RNA is generally  detectable in upper and lower respiratory sp ecimens during the acute  phase of infection. The expected result is Negative. Fact Sheet for Patients:  StrictlyIdeas.no Fact Sheet for Healthcare Providers: BankingDealers.co.za This test is not yet approved or cleared by the [place] FDA and has been authorized for detection and/or diagnosis of SARS-CoV-2 by FDA under an Emergency Use Authorization (EUA).  This EUA will remain in effect (meaning this test can be used) for the duration of the COVID-19 declaration under Section 564(b)(1) of the Act, 21 U.S.C. section 360bbb-3(b)(1), unless the authorization is terminated or revoked sooner. Performed at [facility], [place] [address] [place], [place] [address]   Troponin I (High Sensitivity)     Status: None   Collection Time: 01/07/19  2:47 PM  Result Value Ref Range   Troponin I (High Sensitivity) <2.00 <18 ng/L    Comment: (NOTE) Elevated high sensitivity troponin I (hsTnI) values and significant  changes across serial  measurements may suggest ACS but many other  chronic and acute conditions are known to elevate hsTnI results.  Refer to the Links section for chest pain algorithms and additional  guidance. Performed at [facility], [place] [address], [place], [place] [address]   Hepatic function panel     Status: None   Collection Time: 02/10/19 11:14 AM  Result Value Ref Range   Total Protein 6.7 6.0 - 8.5 g/dL   Albumin 4.0 4.0 - 5.0 g/dL   Bilirubin Total 0.4 0.0 - 1.2 mg/dL   Bilirubin, Direct 0.13 0.00 - 0.40 mg/dL   Alkaline Phosphatase 80 39 - 117 IU/L   AST 30 0 - 40 IU/L   ALT 44 0 - 44 IU/L  VITAMIN D 25 Hydroxy (Vit-D Deficiency, Fractures)     Status: None   Collection Time: 02/10/19 11:14 AM  Result Value Ref Range   Vit D, 25-Hydroxy 30.2 30.0 - 100.0 ng/mL    Comment: Vitamin D deficiency has been defined by the [place] and an Endocrine Society practice guideline as a level of serum 25-OH vitamin D less than 20 ng/mL (1,2). The Endocrine Society went on to further define vitamin D insufficiency as a level between 21 and 29 ng/mL (2). 1. IOM (Institute of Medicine). 2010. Dietary reference    intakes for calcium and D. [place]: The    [employer]. 2. Holick MF, Binkley [place], Bischoff-Ferrari HA, et al.    Evaluation, treatment, and prevention of vitamin D    deficiency: an Endocrine Society clinical practice    guideline. JCEM. 2011 Jul; 96(7):1911-30.   Direct LDL     Status: None   Collection Time: 02/10/19 11:14 AM  Result Value Ref Range   LDL Direct 57 0 - 99 mg/dL  Lipid panel     Status: Abnormal   Collection Time: 02/10/19 11:14 AM  Result Value Ref Range   Cholesterol, Total 145 100 - 199 mg/dL   Triglycerides 392 (H) 0 - 149 mg/dL   HDL 27 (L) >39 mg/dL   VLDL Cholesterol Cal 78 (H) 5 - 40 mg/dL   LDL Calculated 40 0 - 99 mg/dL   Chol/HDL Ratio 5.4 (H) 0.0 - 5.0 ratio    Comment:                                    T. Chol/HDL  Ratio                                             Men  Women                               1/2 Avg.Risk  3.4    3.3                                   Avg.Risk  5.0    4.4                                2X Avg.Risk  9.6    7.1                                3X Avg.Risk 23.4   11.0       Psychiatric Specialty Exam: Physical Exam  ROS  There were no vitals taken for this visit.There is no height or weight on file to calculate BMI.  General Appearance: NA  Eye Contact:  NA  Speech:  Clear and Coherent and Normal Rate  Volume:  Normal  Mood:  Euthymic  Affect:  NA  Thought Process:  Goal Directed  Orientation:  Full (Time, Place, and Person)  Thought Content:  WDL  Suicidal Thoughts:  No  Homicidal Thoughts:  No  Memory:  Immediate;   Good Recent;   Good Remote;   Fair  Judgement:  Fair  Insight:  Fair  Psychomotor Activity:  NA  Concentration:  Concentration: Good and Attention Span: Good  Recall:  [place] of Knowledge:  Good  Language:  Good  Akathisia:  No  Handed:  Right  AIMS (if indicated):     Assets:  Communication Skills Desire for Improvement Housing Resilience Talents/Skills Transportation  ADL's:  Intact  Cognition:  WNL  Sleep:   Okay      Assessment and Plan: Bipolar disorder type I.  Posttraumatic stress disorder.  Mild cannabis use and alcohol abuse.  I reviewed blood work results from recent emergency room visit.  He is not taking acid reflux medication.  I discussed and reminded that anytime he has a rash then he should call us immediately because Lamictal rash can be dangerous and lethal.  Patient agreed that next time if he had any rash then he will call us but he also mentioned that his rash resolved very quickly.  He did not believe it was due to Lamictal.  He like to continue medicine but is not interested in therapy.  I will continue Seroquel 100 mg at bedtime and Lamictal 100 mg daily.  We discussed stopping alcohol and cannabis  but patient minimizes impact of alcohol and cannabis on his mental health and he feels it helps to calm him down and joint pain.  Recommended to call us back if he has any question or any concern.  Follow-up in 3 months.  Follow Up Instructions:    I discussed the assessment and treatment plan with the patient. The patient was provided an opportunity to ask questions and all were answered. The patient agreed with the  plan and demonstrated an understanding of the instructions.   The patient was advised to call back or seek an in-person evaluation if the symptoms worsen or if the condition fails to improve as anticipated.  I provided 20 minutes of non-face-to-face time during this encounter.   Kathlee Nations, MD

## 2019-05-24 ENCOUNTER — Encounter: Payer: Self-pay | Admitting: Neurology

## 2019-05-24 ENCOUNTER — Other Ambulatory Visit: Payer: Self-pay

## 2019-05-24 ENCOUNTER — Ambulatory Visit (INDEPENDENT_AMBULATORY_CARE_PROVIDER_SITE_OTHER): Payer: 59 | Admitting: Neurology

## 2019-05-24 VITALS — BP 137/88 | HR 89 | Temp 98.0°F | Ht 72.0 in | Wt 216.8 lb

## 2019-05-24 DIAGNOSIS — Q211 Atrial septal defect: Secondary | ICD-10-CM

## 2019-05-24 DIAGNOSIS — R413 Other amnesia: Secondary | ICD-10-CM | POA: Diagnosis not present

## 2019-05-24 DIAGNOSIS — G3184 Mild cognitive impairment, so stated: Secondary | ICD-10-CM

## 2019-05-24 DIAGNOSIS — I82403 Acute embolism and thrombosis of unspecified deep veins of lower extremity, bilateral: Secondary | ICD-10-CM

## 2019-05-24 DIAGNOSIS — Q2112 Patent foramen ovale: Secondary | ICD-10-CM

## 2019-05-24 NOTE — Progress Notes (Signed)
[facility] [address] [place]. [place] [address] [phone]       OFFICE FOLLOW-UP NOTE  Mr. JOESEPH GLAD Date of Birth:  Jul 14, 1970 Medical Record Number:  [id number]   HPI: Mr. Silvestro is a 48 year old Caucasian male seen today for the first office follow-up visit following hospital admission for stroke in January 2019.history is obtained from the patient and review of electronic medical records. I have personally reviewed imaging films.[name] an 48 y.o.malewith history of hypercholesterolemia, carpal tunnel,depression and anxiety. Patient presented to the emergency department on 3 January of 2018 for difficulty with hand eye coordination. She apparently took a nap on 25 June 2017 but upon waking he had some confusion, he noticed some difficulty using the cell phone as far as difficulty pushing the numbers and using it correctly. However this did make him nervous as his family has a history of strokes that she reported to the emergency department. Upon entering the patient's blood pressure was stable, chemistry panel and CBC were stable however CT of the head demonstrated a low-attenuation in the left basal ganglia which was suspicious for a possible acute ischemic infarct. This is followed up by an MRI of the brain which did not show any acute/subacute nonhemorrhagic infarct involving the leftlentiform nucleus and internal capsule along with a left caudate head. MRA was nonrevealing. Patient's LDL was unable to be calculated secondary to elevated triglycerides.  Date last known well:Date:06/25/2017 Time last known well:Time:10:00am tPA Given:No:Out of the window. Patient had been noncompliant with his medications and was smoking and drinking heavily. MRI scan of the brain showed left basal gangliaand subinsular large subcortical infarct. MRA of the brain showed no significant large vessel stenosis. Carotid ultrasound showed no  significant extracranial stenosis. Transthoracic echo showed normal ejection fraction. LDL could not be calculated as his triglycerides were quite high. Patient was not on aspirin. A single physical occupational therapy. He is very minimum deficits. He was discharged home and came back for an outpatient transesophageal echocardiogram which was done on 06/27/17 and showed a atrial septal aneurysm and patent foramen ovale. He subsequently also had outpatient loop recorder inserted on 09/25/17 by Dr. [name] insofar paroxysmal A. Fib has not yet been found. The patient does give a family history of multiple strokes in his father which began in the 38s. He denies any history of deep vein thrombosis, pulmonary embolism or migraines. 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 There is no family history of strokes at a young age. Update 05/24/2019 ; he returns for follow-up after last visit a year and half ago.  He underwent successful endovascular PFO closure by Dr. Burt Knack on 11/19/2017.  He has done well post closure.  He had follow-up 2D echo with bubble study done confirming closure but did not have a TCD bubble study as of yet.  Is tolerating aspirin well without bleeding or bruising.  Is also on Lipitor and tolerating well without muscle aches and pains.  Patient has been complaining of progressive memory difficulties for the last few months.  He is forgets recent information and often misplaces objects and has to spend a lot of time trying to find them.  However he seems to be able to focus and work quite well as a English as a second language teacher.  He has had no problems on his work and  his work quality has not suffered.  He denies feeling depressed.  He denies any headaches, focal gait balance or vision problems.  He is on lamotrigine for bipolar disorder and sees a psychiatrist.   ROS:   14 system review of  systems is positive for memory loss, word finding difficulty,neck pain and stiffness and all other systems negative  PMH:  Past Medical History:  Diagnosis Date   Depression    High cholesterol    PFO (patent foramen ovale)    a. s/p PFO closure by Dr. Burt Knack on 11/19/17   Stroke [facility])     Social History:  Social History   Socioeconomic History   Marital status: Married    Spouse name: Not on file   Number of children: 2   Years of education: Not on file   Highest education level: 8th grade  Occupational History   Not on file  Social Needs   Financial resource strain: Somewhat hard   Food insecurity    Worry: Never true    Inability: Never true   Transportation needs    Medical: No    Non-medical: No  Tobacco Use   Smoking status: Former Smoker    Packs/day: 0.25    Years: 28.00    Pack years: 7.00    Types: Cigarettes    Quit date: 07/25/2017    Years since quitting: 1.8   Smokeless tobacco: Never Used  Substance and Sexual Activity   Alcohol use: Yes    Alcohol/week: 6.0 standard drinks    Types: 6 Cans of beer per week    Comment: 6 pack nightly   Drug use: Yes    Types: Marijuana    Comment: takes daily for pain leisure    Sexual activity: Yes    Birth control/protection: Surgical  Lifestyle   Physical activity    Days per week: 2 days    Minutes per session: 30 min   Stress: To some extent  Relationships   Social connections    Talks on phone: More than three times a week    Gets together: More than three times a week    Attends religious service: Never    Active member of club or organization: No    Attends meetings of clubs or organizations: Never    Relationship status: Married   Intimate partner violence    Fear of current or ex partner: No    Emotionally abused: No    Physically abused: No    Forced sexual activity: No  Other Topics Concern   Not on file  Social History Narrative   Not on file    Medications:     Current Outpatient Medications on File Prior to Visit  Medication Sig Dispense Refill   aspirin EC 81 MG tablet Take 81 mg by mouth daily with lunch.      atorvastatin (LIPITOR) 40 MG tablet TAKE 1 TABLET BY MOUTH ONCE DAILY AT 6 PM 90 tablet 0   lamoTRIgine (LAMICTAL) 100 MG tablet Take 1 tablet (100 mg total) by mouth daily. 90 tablet 0   Menthol, Topical Analgesic, (ICY HOT EX) Apply 1 application topically 3 (three) times daily as needed (pain).      Multiple Vitamin (MULTIVITAMIN WITH MINERALS) TABS tablet Take 1 tablet by mouth daily with lunch.     naproxen sodium (ALEVE) 220 MG tablet Take 220-440 mg by mouth 3 (three) times daily as needed (pain/headaches).     omega-3 acid ethyl esters (LOVAZA)  1 g capsule TAKE 2 CAPSULES BY MOUTH 2 TIMES DAILY. 240 capsule 2   omeprazole (PRILOSEC) 20 MG capsule Take 1 capsule (20 mg total) by mouth daily. 90 capsule 0   QUEtiapine (SEROQUEL) 100 MG tablet Take 1 tablet (100 mg total) by mouth at bedtime. 90 tablet 0   No current facility-administered medications on file prior to visit.     Allergies:   Allergies  Allergen Reactions   Ibuprofen Other (See Comments)    Bloody stools   Penicillins Hives    Has patient had a PCN reaction causing immediate rash, facial/tongue/throat swelling, SOB or lightheadedness with hypotension: No Has patient had a PCN reaction causing severe rash involving mucus membranes or skin necrosis: Yes Has patient had a PCN reaction that required hospitalization: no Has patient had a PCN reaction occurring within the last 10 years: no If all of the above answers are "NO", then may proceed with Cephalosporin use.    Physical Exam General: well developed, well nourished heavily body tattooed young Caucasian male, seated, in no evident distress Head: head normocephalic and atraumatic.  Neck: supple with no carotid or supraclavicular bruits Cardiovascular: regular rate and rhythm, no  murmurs Musculoskeletal: no deformity Skin:  no rash/petichiae multiple body tattoos all over Vascular:  Normal pulses all extremities Vitals:   05/24/19 1433  BP: 137/88  Pulse: 89  Temp: 98 F (36.7 C)   Neurologic Exam Mental Status: Awake and fully alert. Oriented to place and time. Recent and remote memory intact. Attention span, concentration and fund of knowledge appropriate. Mood and affect appropriate.  Diminished recall 1/3.  Able to name 11 animals which can walk on 4 legs.  Does not appear to be depressed Cranial Nerves: Fundoscopic exam not done. Pupils equal, briskly reactive to light. Extraocular movements full without nystagmus. Visual fields full to confrontation. Hearing intact. Facial sensation intact. Face, tongue, palate moves normally and symmetrically.  Motor: Normal bulk and tone. Normal strength in all tested extremity muscles. Sensory.: intact to touch ,pinprick .position and vibratory sensation.  Coordination: Rapid alternating movements normal in all extremities. Finger-to-nose and heel-to-shin performed accurately bilaterally. Gait and Station: Arises from chair without difficulty. Stance is normal. Gait demonstrates normal stride length and balance . Able to heel, toe and tandem walk without difficulty.  Reflexes: 1+ and symmetric. Toes downgoing.   NIHSS  0 Modified Rankin  1   ASSESSMENT: 48 year old Caucasian male with cryptogenic left basal ganglia infarct and January 2019 with vascular risk factors of tobacco abuse, hyperlipidemia, heavy alcohol intake and patent foramen ovale.  New complaints of memory and cognitive difficulties likely due to mild cognitive impairment.  Etiology unclear    PLAN: I had a long discussion with the patient regarding his new complaints of memory difficulties and recommend further evaluation by checking memory panel labs, EEG and MRI scan of the brain.  Also check transcranial Doppler bubble study for adequacy of PFO  closure.  He was encouraged to increase participate in activities which are cognitively challenging like solving crossword puzzles, playing bridge and sudoku.  We also discussed memory compensation strategies.  I also recommend he continue to take aspirin daily for stroke prevention and maintain aggressive risk factor modification with strict control of lipids with LDL cholesterol goal below 70 mg percent.  He was encouraged to eat a healthy diet and to be active and not gain weight.  He will return for follow-up in the future in 3 months or call earlier if necessary.  Greater than 50% of time during this 25 minute visit was spent on counseling,explanation of diagnosis of cryptogenic stroke and PFO, planning of further management, discussion with patient and family and coordination of care Antony Contras, MD  [facility] [address] [place], [place] [address]  Phone [phone] Fax [phone] Note: This document was prepared with digital dictation and possible smart phrase technology. Any transcriptional errors that result from this process are unintentional

## 2019-05-24 NOTE — Patient Instructions (Addendum)
I had a long discussion with the patient regarding his new complaints of memory difficulties and recommend further evaluation by checking memory panel labs, EEG and MRI scan of the brain.  Also check transcranial Doppler bubble study for adequacy of PFO closure.  He was encouraged to increase participate in activities which are cognitively challenging like solving crossword puzzles, playing bridge and sudoku.  We also discussed memory compensation strategies.  I also recommend he continue to take aspirin daily for stroke prevention and maintain aggressive risk factor modification with strict control of lipids with LDL cholesterol goal below 70 mg percent.  He was encouraged to eat a healthy diet and to be active and not gain weight.  He will return for follow-up in the future in 3 months or call earlier if necessary. Memory Compensation Strategies  1. Use "WARM" strategy.  W= write it down  A= associate it  R= repeat it  M= make a mental note  2.   You can keep a Social worker.  Use a 3-ring notebook with sections for the following: calendar, important names and phone numbers,  medications, doctors' names/phone numbers, lists/reminders, and a section to journal what you did  each day.   3.    Use a calendar to write appointments down.  4.    Write yourself a schedule for the day.  This can be placed on the calendar or in a separate section of the Memory Notebook.  Keeping a  regular schedule can help memory.  5.    Use medication organizer with sections for each day or morning/evening pills.  You may need help loading it  6.    Keep a basket, or pegboard by the door.  Place items that you need to take out with you in the basket or on the pegboard.  You may also want to  include a message board for reminders.  7.    Use sticky notes.  Place sticky notes with reminders in a place where the task is performed.  For example: " turn off the  stove" placed by the stove, "lock the door" placed on  the door at eye level, " take your medications" on  the bathroom mirror or by the place where you normally take your medications.  8.    Use alarms/timers.  Use while cooking to remind yourself to check on food or as a reminder to take your medicine, or as a  reminder to make a call, or as a reminder to perform another task, etc.

## 2019-05-31 ENCOUNTER — Other Ambulatory Visit: Payer: Self-pay | Admitting: Adult Health

## 2019-06-01 ENCOUNTER — Other Ambulatory Visit: Payer: Self-pay | Admitting: Adult Health

## 2019-06-01 MED FILL — OMEPRAZOLE 20 MG CAP: 20 | 30 days supply | Qty: 30 | Fill #0

## 2019-06-01 MED FILL — ATORVASTATIN 40 MG TABLET: 40 | 30 days supply | Qty: 30 | Fill #0

## 2019-06-07 ENCOUNTER — Other Ambulatory Visit: Payer: Self-pay

## 2019-06-07 ENCOUNTER — Ambulatory Visit (INDEPENDENT_AMBULATORY_CARE_PROVIDER_SITE_OTHER): Payer: 59 | Admitting: Psychiatry

## 2019-06-07 ENCOUNTER — Encounter (HOSPITAL_COMMUNITY): Payer: Self-pay | Admitting: Psychiatry

## 2019-06-07 DIAGNOSIS — F1210 Cannabis abuse, uncomplicated: Secondary | ICD-10-CM | POA: Diagnosis not present

## 2019-06-07 DIAGNOSIS — F4310 Post-traumatic stress disorder, unspecified: Secondary | ICD-10-CM

## 2019-06-07 DIAGNOSIS — F319 Bipolar disorder, unspecified: Secondary | ICD-10-CM

## 2019-06-07 DIAGNOSIS — F1010 Alcohol abuse, uncomplicated: Secondary | ICD-10-CM | POA: Diagnosis not present

## 2019-06-07 NOTE — Progress Notes (Signed)
Virtual Visit via Telephone Note  I connected with Steven Herrera on 06/07/19 at  9:40 AM EST by telephone and verified that I am speaking with the correct person using two identifiers.   I discussed the limitations, risks, security and privacy concerns of performing an evaluation and management service by telephone and the availability of in person appointments. I also discussed with the patient that there may be a patient responsible charge related to this service. The patient expressed understanding and agreed to proceed.   History of Present Illness: Patient was evaluated by phone session.  He had decided to come off from the medication because he feels he is doing fine and he wants to see if he needs medication.  He reported no irritability, anger, mania or any psychosis.  He still struggle with insomnia but he does not want any new medication for that.  He recall that he had tried multiple sleep medication and that did not work for him.  He continues to smoke marijuana once a week and drinks 2-3 beer every day.  He denies any intoxication, binge or any blackouts.  He lives with his wife who is supportive.  Recently he saw neurology with complaint of memory impairment but no new medication added.  He reported no nightmares flashback but sometimes does get dreams.  He like to try coming off from the medication because he feel he could do it.  Denies any suicidal thoughts or homicidal thoughts.   Past Psychiatric History:Reviewed. H/O briefly seeing therapist at [facility] in young age. H/O mood swing, anger, agitation, hyperactivity and running away. Used acid, LSD, cocaine, marijuana and heavy drinking. Tried Zoloft, Lexapro and Xanax did not like it. Took Paxil and Klonopin and Wellbutrin from PCP.    Psychiatric Specialty Exam: Physical Exam  Review of Systems  There were no vitals taken for this visit.There is no height or weight on file to calculate BMI.  General Appearance: NA  Eye  Contact:  NA  Speech:  Clear and Coherent  Volume:  Normal  Mood:  Euthymic  Affect:  NA  Thought Process:  Goal Directed  Orientation:  Full (Time, Place, and Person)  Thought Content:  WDL  Suicidal Thoughts:  No  Homicidal Thoughts:  No  Memory:  Immediate;   Good Recent;   Fair Remote;   Good  Judgement:  Intact  Insight:  Fair  Psychomotor Activity:  NA  Concentration:  Concentration: Good and Attention Span: Good  Recall:  Good  Fund of Knowledge:  Good  Language:  Good  Akathisia:  No  Handed:  Right  AIMS (if indicated):     Assets:  Communication Skills Desire for Improvement Housing Resilience Talents/Skills  ADL's:  Intact  Cognition:  WNL  Sleep:   fair      Assessment and Plan: Bipolar disorder type I.  PTSD.  Mild cannabis use.  Alcohol abuse.  I reviewed notes from neurology and his current medication.  I talked to him in length about his underlying diagnosis.  He realized that he was diagnosed with bipolar PTSD but also feel that he does not need the medication anymore.  We talked about stopping his cannabis and alcohol and he realized that he has to stop if he is trying to get a fresh scar.  He promised that if symptoms started to come back then he will resume his medication and call us back.  I discussed slow titration as per his request to come off from  the medication.  He will take Lamictal and Seroquel half dose for 2 weeks and stop but he realized that if symptoms started to get worse then he will call us back immediately.  Discussed safety concerns and anytime having active suicidal thoughts or homicidal thought that he need to call 911 or go to local emergency room.  He like to schedule appointment in 6 weeks about his progress without medication.  No new medication given at this visit.  Follow Up Instructions:    I discussed the assessment and treatment plan with the patient. The patient was provided an opportunity to ask questions and all were  answered. The patient agreed with the plan and demonstrated an understanding of the instructions.   The patient was advised to call back or seek an in-person evaluation if the symptoms worsen or if the condition fails to improve as anticipated.  I provided 20 minutes of non-face-to-face time during this encounter.   Kathlee Nations, MD

## 2019-06-09 ENCOUNTER — Ambulatory Visit (HOSPITAL_COMMUNITY): Payer: 59 | Admitting: Psychiatry

## 2019-06-11 ENCOUNTER — Ambulatory Visit (HOSPITAL_COMMUNITY)
Admission: RE | Admit: 2019-06-11 | Discharge: 2019-06-11 | Disposition: A | Discharge status: Home / Self Care | Payer: 59 | Source: Ambulatory Visit | Attending: Neurology | Admitting: Neurology

## 2019-06-11 ENCOUNTER — Other Ambulatory Visit: Payer: Self-pay

## 2019-06-11 DIAGNOSIS — Q211 Atrial septal defect: Secondary | ICD-10-CM | POA: Insufficient documentation

## 2019-06-11 DIAGNOSIS — Q2112 Patent foramen ovale: Secondary | ICD-10-CM

## 2019-06-11 NOTE — Progress Notes (Signed)
TCD bubble study completed with Dr. Erlinda Hong. Refer to "CV Proc" under chart review to view preliminary results.  06/11/2019 2:09 PM Kelby Aline., MHA, RVT, RDCS, RDMS

## 2019-06-14 ENCOUNTER — Ambulatory Visit (HOSPITAL_COMMUNITY): Payer: 59 | Admitting: Psychiatry

## 2019-06-14 ENCOUNTER — Other Ambulatory Visit: Payer: 59

## 2019-06-21 DIAGNOSIS — Z0289 Encounter for other administrative examinations: Secondary | ICD-10-CM

## 2019-07-05 ENCOUNTER — Other Ambulatory Visit: Payer: Self-pay

## 2019-07-05 ENCOUNTER — Ambulatory Visit (INDEPENDENT_AMBULATORY_CARE_PROVIDER_SITE_OTHER): Payer: 59 | Admitting: Neurology

## 2019-07-05 DIAGNOSIS — G3184 Mild cognitive impairment, so stated: Secondary | ICD-10-CM

## 2019-07-05 DIAGNOSIS — R410 Disorientation, unspecified: Secondary | ICD-10-CM | POA: Diagnosis not present

## 2019-07-05 DIAGNOSIS — R413 Other amnesia: Secondary | ICD-10-CM

## 2019-07-07 NOTE — Progress Notes (Signed)
Kindly inform the patient that EEG study was normal

## 2019-07-09 ENCOUNTER — Other Ambulatory Visit: Payer: Self-pay | Admitting: Adult Health

## 2019-07-12 ENCOUNTER — Ambulatory Visit (INDEPENDENT_AMBULATORY_CARE_PROVIDER_SITE_OTHER): Payer: 59 | Admitting: Psychiatry

## 2019-07-12 ENCOUNTER — Encounter (HOSPITAL_COMMUNITY): Payer: Self-pay | Admitting: Psychiatry

## 2019-07-12 ENCOUNTER — Other Ambulatory Visit: Payer: Self-pay

## 2019-07-12 ENCOUNTER — Telehealth: Payer: Self-pay

## 2019-07-12 DIAGNOSIS — F1290 Cannabis use, unspecified, uncomplicated: Secondary | ICD-10-CM | POA: Diagnosis not present

## 2019-07-12 DIAGNOSIS — F1210 Cannabis abuse, uncomplicated: Secondary | ICD-10-CM

## 2019-07-12 DIAGNOSIS — F319 Bipolar disorder, unspecified: Secondary | ICD-10-CM | POA: Diagnosis not present

## 2019-07-12 DIAGNOSIS — F325 Major depressive disorder, single episode, in full remission: Secondary | ICD-10-CM | POA: Diagnosis not present

## 2019-07-12 DIAGNOSIS — E559 Vitamin D deficiency, unspecified: Secondary | ICD-10-CM | POA: Diagnosis not present

## 2019-07-12 MED FILL — OMEPRAZOLE 20 MG CAP: 20 | 15 days supply | Qty: 15 | Fill #0

## 2019-07-12 MED FILL — ATORVASTATIN 40 MG TABLET: 40 | 15 days supply | Qty: 15 | Fill #0

## 2019-07-12 NOTE — Telephone Encounter (Signed)
Please call pt to schedule f/u.  No further refills until pt is seen.  Charyl Bigger, CMA

## 2019-07-12 NOTE — Progress Notes (Signed)
Virtual Visit via Telephone Note  I connected with Steven Herrera on 07/12/19 at 11:40 AM EST by telephone and verified that I am speaking with the correct person using two identifiers.   I discussed the limitations, risks, security and privacy concerns of performing an evaluation and management service by telephone and the availability of in person appointments. I also discussed with the patient that there may be a patient responsible charge related to this service. The patient expressed understanding and agreed to proceed.   History of Present Illness: Patient was evaluated by phone session.  He is no longer taking Lamictal and Seroquel.  He was hoping that he will see improvement in his attention concentration after stopping the medication.  However he is concerned about fogginess and sometimes he feels twitching and tremors.  He recall these twitching and tremors for a long time and did not increase or decrease during the medication or after discontinuing the medication.  He is also concerned about his sleep.  He only sleeps a few hours.  He reported his sleep is a chronic issue and has been struggling for many years.  Recently he seen neurology and he had EEG and had upcoming appointment to see and discuss the results.  He admitted cutting down his marijuana and only smoked once a week but lately he has not done that.  He also cut down his drinking and he drinks occasional beer but denies any intoxication, binge or blackout.  He lives with his wife is very supportive.  He has his own business of tattoo and he is very concerned because sometimes he has difficulty working and does not want to mess up his business because of fogginess, twitching and shakes.  He wanted to stop the medicine to give a fresh start to his brain.  Denies any anger, highs and lows, mania, psychosis or any hallucination.  We have discussed that stopping medication may cause relapse into illness and he is aware about it.  He  had informed his coworker and his wife that if they notice any changes in his mood then he will call us immediately.  At this time he wants to still away from the medication since he has not noticed a big difference in his behavior and mood.  Elect to follow-up with neurology to address his brain fogginess, attention, insomnia and twitching.     Past Psychiatric History:Reviewed. H/[facility]rief[facility] in[facility] age. H/Omood swing, anger, agitation, hyperactivity and running away. Used acid, LSD, cocaine, marijuana and heavy drinking. Tried Zoloft, Lexapro, melatonin and Xanax did not like it. Took Paxil and Klonopin and Wellbutrin from PCP.    Psychiatric Specialty Exam: Physical Exam  Review of Systems  There were no vitals taken for this visit.There is no height or weight on file to calculate BMI.  General Appearance: NA  Eye Contact:  NA  Speech:  Normal Rate  Volume:  Normal  Mood:  Euthymic  Affect:  NA  Thought Process:  Goal Directed  Orientation:  Full (Time, Place, and Person)  Thought Content:  WDL  Suicidal Thoughts:  No  Homicidal Thoughts:  No  Memory:  Immediate;   Good Recent;   Fair Remote;   Fair  Judgement:  Intact  Insight:  Fair  Psychomotor Activity:  NA and Some time twitching  Concentration:  Concentration: Fair and Attention Span: Fair  Recall:  Good  Fund of Knowledge:  Good  Language:  Good  Akathisia:  No  Handed:  Right  AIMS (if indicated):     Assets:  Communication Skills Desire for Improvement Housing Resilience Social Support  ADL's:  Intact  Cognition:  WNL  Sleep:   fair. Few hrs      Assessment and Plan: Bipolar disorder type I.  Mild cannabis use.  Patient is not taking Seroquel and Lamictal as he wanted to get a fresh start with his brain.  His chronic complaint of fogginess, twitching, insomnia still there.  I discussed that he should discuss about sleep study and neurocognitive testing to help establish the  diagnosis.  Currently he does not have any symptoms of mania and depression and he is aware that if his symptoms appears then he will call us immediately.  He had informed his wife and coworker if his mood changes.  He like to have a follow-up in 3 months.  He also wants his records sent to neurology.  Follow Up Instructions:    I discussed the assessment and treatment plan with the patient. The patient was provided an opportunity to ask questions and all were answered. The patient agreed with the plan and demonstrated an understanding of the instructions.   The patient was advised to call back or seek an in-person evaluation if the symptoms worsen or if the condition fails to improve as anticipated.  I provided 20 minutes of non-face-to-face time during this encounter.   Kathlee Nations, MD

## 2019-07-28 ENCOUNTER — Ambulatory Visit (INDEPENDENT_AMBULATORY_CARE_PROVIDER_SITE_OTHER): Payer: 59 | Admitting: Adult Health

## 2019-07-28 ENCOUNTER — Other Ambulatory Visit: Payer: Self-pay

## 2019-07-28 ENCOUNTER — Encounter: Payer: Self-pay | Admitting: Adult Health

## 2019-07-28 VITALS — BP 107/69 | HR 75 | Temp 98.4°F | Ht 72.0 in | Wt 217.4 lb

## 2019-07-28 DIAGNOSIS — Z0000 Encounter for general adult medical examination without abnormal findings: Secondary | ICD-10-CM | POA: Diagnosis not present

## 2019-07-28 DIAGNOSIS — F418 Other specified anxiety disorders: Secondary | ICD-10-CM | POA: Diagnosis not present

## 2019-07-28 DIAGNOSIS — E559 Vitamin D deficiency, unspecified: Secondary | ICD-10-CM

## 2019-07-28 DIAGNOSIS — R030 Elevated blood-pressure reading, without diagnosis of hypertension: Secondary | ICD-10-CM

## 2019-07-28 DIAGNOSIS — Z79899 Other long term (current) drug therapy: Secondary | ICD-10-CM

## 2019-07-28 DIAGNOSIS — E781 Pure hyperglyceridemia: Secondary | ICD-10-CM

## 2019-07-28 DIAGNOSIS — E7800 Pure hypercholesterolemia, unspecified: Secondary | ICD-10-CM

## 2019-07-28 NOTE — Assessment & Plan Note (Signed)
Has been off Seroquel and Lamictal >1 month Will f/u with BH/psychaitry/Dr. Adele Schilder next month

## 2019-07-28 NOTE — Assessment & Plan Note (Signed)
BP excellent today- 107/69, HR 75 Not on anti-hypertensives

## 2019-07-28 NOTE — Progress Notes (Signed)
Subjective:    Patient ID: Steven Herrera, male    DOB: 03-Jul-1970, 49 y.o.   MRN: [id number]  HPI: This note is not being shared with the patient for the following reason: To respect privacy (The patient or proxy has requested that the information not be shared).   08/10/2018 OV: Steven Herrera is here for CPE He reports medication compliance, denies SE He estimates to drink >60 oz water a day and has been trying to limit saturated fat in his diet He remains active on "on my 32 acres at home" He continues to abstain from tobacco and has dramatically reduced marijuana use- once/month He has finally established with Psychiatrist- medications adjusted and followed monthly currently. He has seen therapist a few times, but "isn't jiving with them". He denies thoughts of harming himself/others  02/08/2019 OV: Steven Herrera calls in today regular f/u: HLD, Depression/Anxiety, Vit D Def He is followed by Psychiatry Q3M He reports stable mood, denies SI/HI He is currently on Seroquel 153m QD, Lamictal 102mQD- he has been weaned off Clonazepam  He has been taking Atorvastatin and Lovaza as directed He continues to drink ETOH: 3-6 beers/night- advised that he needs to keep ETOH use to 0-2/night He denies RUQ pain He states "I try to eat healthy", and remains active with house/yard work He denies acute complaints today  He was seen at local ED 01/07/2019- CP EKG- SR R Axis Deviation, No STEMI CXR- No Active Disease Troponins-negative SARS-CoV-2 virus Negative  07/28/2019 OV: Steven Herrera here for regular f/u: HLD and Bipolar Disorder Type 1 Followed closely by BH/Psychiatry- Dr. ArAdele Schilderlast OV 07/12/2019 Assessment and Plan: Bipolar disorder type I.  Mild cannabis use.  Patient is not taking Seroquel and Lamictal as he wanted to get a fresh start with his brain.  His chronic complaint of fogginess, twitching, insomnia still there.  I discussed that he should discuss about sleep  study and neurocognitive testing to help establish the diagnosis.  Currently he does not have any symptoms of mania and depression and he is aware that if his symptoms appears then he will call usKoreammediately.  He had informed his wife and coworker if his mood changes.  He like to have a follow-up in 3 months.  He also wants his records sent to neurology.  He denies SI/HI, reports stable mood.  He will f/u with Dr. ArAdele Schildern a few weeks via TeleMedicine.  He had EEG 07/07/2019 Recent EEG- normal 06/2019  Summary  Normal electroencephalogram, awake, asleep and with activation procedures. There are no focal lateralizing or epileptiform features.  He reports drinking 1-3 Bug Lights/night and "smoking one bowl of weed" a night to "help unwind from the day". He denies chest pain/tightness with exertion. He denies exercise during winter months due to cold. He reports working 50-60 hrs/week at his [employer] He has been off statin and lovaza for weeks-ran out of Rx  Patient Care Team    Relationship Specialty Notifications Start End  DaMina Marble, NP PCP - General Family Medicine  07/11/17   SeGarvin FilaMD Consulting Physician Neurology  07/11/17   [facility]    08/25/17     Patient Active Problem List   Diagnosis Date Noted  . Deep vein [place] (DVT) of both lower [place]  . Hypertriglyceridemia 02/08/2019  . Contact dermatitis 01/06/2019  . Vitamin D deficiency 06/01/2018  . High cholesterol 02/09/2018  . PFO with  atrial septal aneurysm 11/19/2017  . On statin therapy 08/25/2017  . Healthcare maintenance 07/11/2017  . Depression with anxiety 06/27/2017  . Cerebrovascular accident (CVA) (HCC) 06/27/2017  . Acute ischemic stroke (HCC) 06/26/2017  . Elevated blood-pressure reading without diagnosis of hypertension 06/26/2017  . NEVUS 02/13/2007     Past Medical History:  Diagnosis Date  . Depression   . High cholesterol   . PFO  (patent foramen ovale)    a. s/p PFO closure by Dr. Cooper on 11/19/17  . Stroke (HCC)      Past Surgical History:  Procedure Laterality Date  . big toe amputation Right   . CARPAL TUNNEL RELEASE    . LOOP RECORDER INSERTION N/A 09/25/2017   Procedure: LOOP RECORDER INSERTION;  Surgeon: Allred, James, MD;  Location: [facility];  Service: Cardiovascular;  Laterality: N/A;  . MOUTH SURGERY    . PATENT FORAMEN OVALE(PFO) CLOSURE N/A 11/19/2017   Procedure: PATENT FORAMEN OVALE (PFO) CLOSURE;  Surgeon: Cooper, Logen, MD;  Location: [facility]ASIVE CV [facility];  Service: Cardiovascular;  Laterality: N/A;  . spider bite    . TEE WITHOUT CARDIOVERSION N/A 09/25/2017   Procedure: TRANSESOPHAGEAL ECHOCARDIOGRAM (TEE) WITH LOOP;  Surgeon: Hilty, Kenneth C, MD;  Location: [facility] ENDOSCOPY;  Service: Cardiovascular;  Laterality: N/A;  . VASECTOMY       Family History  Problem Relation Age of Onset  . Stroke Father        disabling CVA in early-mid 50's  . Hypertension Father   . Alcohol abuse Father   . Heart disease Mother   . Dementia Mother   . Atrial fibrillation Mother   . Drug abuse Sister   . Hypertension Brother   . Healthy Sister      Social History   Substance and Sexual Activity  Drug Use Yes  . Types: Marijuana   Comment: takes daily for pain leisure      Social History   Substance and Sexual Activity  Alcohol Use Yes  . Alcohol/week: 6.0 standard drinks  . Types: 6 Cans of beer per week   Comment: 6 pack nightly     Social History   Tobacco Use  Smoking Status Former Smoker  . Packs/day: 0.25  . Years: 28.00  . Pack years: 7.00  . Types: Cigarettes  . Quit date: 07/25/2017  . Years since quitting: 2.0  Smokeless Tobacco Never Used     Outpatient Encounter Medications as of 07/28/2019  Medication Sig  . aspirin EC 81 MG tablet Take 81 mg by mouth daily with lunch.   . atorvastatin (LIPITOR) 40 MG tablet TAKE 1 TABLET BY MOUTH ONCE DAILY AT 6 PM **NEEDS  APPOINTMENT FOR FURTHER REFILLS**  . Menthol, Topical Analgesic, (ICY HOT EX) Apply 1 application topically 3 (three) times daily as needed (pain).   . Multiple Vitamin (MULTIVITAMIN WITH MINERALS) TABS tablet Take 1 tablet by mouth daily with lunch.  . naproxen sodium (ALEVE) 220 MG tablet Take 220-440 mg by mouth 3 (three) times daily as needed (pain/headaches).  . omega-3 acid ethyl esters (LOVAZA) 1 g capsule TAKE 2 CAPSULES BY MOUTH 2 TIMES DAILY.  . omeprazole (PRILOSEC) 20 MG capsule TAKE 1 CAPSULE BY MOUTH ONCE DAILY **NEEDS APPOINTMENT FOR FURTHER REFILLS**  . [DISCONTINUED] lamoTRIgine (LAMICTAL) 100 MG tablet Take 1 tablet (100 mg total) by mouth daily. (Patient not taking: Reported on 07/12/2019)  . [DISCONTINUED] QUEtiapine (SEROQUEL) 100 MG tablet Take 1 tablet (100 mg total) by mouth at   bedtime. (Patient not taking: Reported on 07/12/2019)   No facility-administered encounter medications on file as of 07/28/2019.    Allergies: Ibuprofen and Penicillins  Body mass index is 29.48 kg/m.  Blood pressure 107/69, pulse 75, temperature 98.4 F (36.9 C), temperature source Oral, height 6' (1.829 m), weight 217 lb 6.4 oz (98.6 kg), SpO2 99 %. Review of Systems  Constitutional: Positive for fatigue. Negative for activity change, appetite change, chills, diaphoresis, fever and unexpected weight change.  Eyes: Negative for visual disturbance.  Respiratory: Negative for cough, chest tightness, shortness of breath, wheezing and stridor.   Cardiovascular: Negative for chest pain, palpitations and leg swelling.  Gastrointestinal: Negative for abdominal distention, abdominal pain, blood in stool, constipation, diarrhea, nausea and vomiting.  Endocrine: Negative for polydipsia, polyphagia and polyuria.  Genitourinary: Negative for difficulty urinating.  Musculoskeletal: Negative for arthralgias and myalgias.  Neurological: Negative for dizziness and headaches.  Hematological: Negative for  adenopathy. Does not bruise/bleed easily.  Psychiatric/Behavioral: Negative for agitation, behavioral problems, confusion, decreased concentration, dysphoric mood, hallucinations, self-injury, sleep disturbance and suicidal ideas. The patient is not nervous/anxious and is not hyperactive.        Objective:   Physical Exam Vitals and nursing note reviewed.  Constitutional:      General: He is not in acute distress.    Appearance: Normal appearance. He is normal weight. He is not ill-appearing or toxic-appearing.  HENT:     Head: Normocephalic and atraumatic.  Eyes:     Extraocular Movements: Extraocular movements intact.     Conjunctiva/sclera: Conjunctivae normal.     Pupils: Pupils are equal, round, and reactive to light.  Cardiovascular:     Rate and Rhythm: Normal rate and regular rhythm.     Pulses: Normal pulses.     Heart sounds: Normal heart sounds.  Pulmonary:     Effort: Pulmonary effort is normal. No respiratory distress.     Breath sounds: Normal breath sounds. No stridor. No wheezing, rhonchi or rales.  Chest:     Chest wall: No tenderness.  Skin:    Capillary Refill: Capillary refill takes less than 2 seconds.  Neurological:     Mental Status: He is alert and oriented to person, place, and time.  Psychiatric:        Mood and Affect: Mood normal.        Behavior: Behavior normal.        Thought Content: Thought content normal.        Judgment: Judgment normal.        Assessment & Plan:   1. Healthcare maintenance   2. On statin therapy   3. Vitamin D deficiency   4. Hypertriglyceridemia   5. Elevated blood-pressure reading without diagnosis of hypertension   6. High cholesterol   7. Depression with anxiety     Healthcare maintenance We will contact you with lab results and send in refill on statin at appropriate dose. Remain well hydrated and follow heart healthy diet. Continue close follow-up with Neurology and Pyschiatry. Follow-up with primary care  in 6 months for physical. Continue to social distance and wear a mask when in public. Basic information for mRNA COVID-19 vaccines Please note that these CDC guidelines can change from week to week, and even day to day.  Refer to the CDC website for the most current recommendations for any given day at https://www.cdc.gov/vaccines/covid-19   1) Under the EUAs (emergency use authorization), the following age groups are authorized to receive the COVID-19 vaccination: .   Pfizer-BioNTech: ages ?16 years . Moderna: ages ?18 years . Children and adolescents outside of these authorized age groups should not receive COVID-19 vaccination at this time   2) You are not eligible to receive a COVID-19 vaccine if: . You have received another vaccine in the last 14-days (example: flu shot, shingles, tetanus, etc.) . You currently have a positive test for COVID-19. The vaccine must be deferred until you have recovered from the acute illness and the criteria has been met for you to discontinue isolation. . You have received passive antibody therapy (monoclonal antibodies or convalescent serum) as treatment for COVID-19 within the past 90 days   3) Currently COVID-19 vaccines are contraindicated in patients who have had an anaphylactic reaction to polyethylene glycol ( ie. GoLYTELY bowel prep or MiraLAX ) or polysorbate products (often used as a thickening agent for many ice creams, frozen custard and whipped dessert products etc).   Also, if you had an anaphylactic reaction to your first COVID-19 vaccination, you cannot receive your second.     - Also if you are currently pregnant please check with your obstetrician to see what their recommendations are regarding vaccination.   - Also, if you have a chronic condition such as CLL, RA or other immunocompromising conditions, please know that no data are currently available (or very limited data) on the safety and efficacy of mRNA COVID-19 vaccines in persons with  autoimmune/ immunocompromising conditions.   Please seek the advice of your specialist regarding whether or not you should receive your COVID-19 vaccination.  Vitamin D deficiency Vit D level drawn today   Elevated blood-pressure reading without diagnosis of hypertension BP excellent today- 107/69, HR 75 Not on anti-hypertensives  High cholesterol Lipid panel and CMP drawn today LDL goal <70 Has been off Atorvastatin 25m for weeks-ran out   Hypertriglyceridemia Lipid panel and CMP drawn today Has been off Lovaza for weeks-ran out   Depression with anxiety Has been off Seroquel and Lamictal >1 month Will f/u with BH/psychaitry/Dr. AAdele Schildernext month    FOLLOW-UP:  Return in about 6 months (around 01/25/2020) for CPE.

## 2019-07-28 NOTE — Assessment & Plan Note (Signed)
We will contact you with lab results and send in refill on statin at appropriate dose. Remain well hydrated and follow heart healthy diet. Continue close follow-up with Neurology and Pyschiatry. Follow-up with primary care in 6 months for physical. Continue to social distance and wear a mask when in public. Basic information for mRNA COVID-19 vaccines Please note that these CDC guidelines can change from week to week, and even day to day.  Refer to the [facility] website for the most current recommendations for any given day at [email or web address]   1) Under the EUAs (emergency use authorization), the following age groups are authorized to receive the COVID-19 vaccination: . Pfizer-BioNTech: ages ?16 years . Moderna: ages ?18 years . Children and adolescents outside of these authorized age groups should not receive COVID-19 vaccination at this time   2) You are not eligible to receive a COVID-19 vaccine if: . You have received another vaccine in the last 14-days (example: flu shot, shingles, tetanus, etc.) . You currently have a positive test for COVID-19. The vaccine must be deferred until you have recovered from the acute illness and the criteria has been met for you to discontinue isolation. . You have received passive antibody therapy (monoclonal antibodies or convalescent serum) as treatment for COVID-19 within the past 90 days   3) Currently COVID-19 vaccines are contraindicated in patients who have had an anaphylactic reaction to polyethylene glycol ( ie. GoLYTELY bowel prep or MiraLAX ) or polysorbate products (often used as a thickening agent for many ice creams, frozen custard and whipped dessert products etc).   Also, if you had an anaphylactic reaction to your first COVID-19 vaccination, you cannot receive your second.     - Also if you are currently pregnant please check with your obstetrician to see what their recommendations are regarding vaccination.   - Also, if  you have a chronic condition such as CLL, RA or other immunocompromising conditions, please know that no data are currently available (or very limited data) on the safety and efficacy of mRNA COVID-19 vaccines in persons with autoimmune/ immunocompromising conditions.   Please seek the advice of your specialist regarding whether or not you should receive your COVID-19 vaccination.

## 2019-07-28 NOTE — Assessment & Plan Note (Signed)
Lipid panel and CMP drawn today Has been off Lovaza for weeks-ran out

## 2019-07-28 NOTE — Assessment & Plan Note (Addendum)
Lipid panel and CMP drawn today LDL goal <70 Has been off Atorvastatin 40mg  for weeks-ran out

## 2019-07-28 NOTE — Patient Instructions (Signed)
Mediterranean Diet A Mediterranean diet refers to food and lifestyle choices that are based on the traditions of countries located on the [employer]. This way of eating has been shown to help prevent certain conditions and improve outcomes for people who have chronic diseases, like kidney disease and heart disease. What are tips for following this plan? Lifestyle  Cook and eat meals together with your family, when possible.  Drink enough fluid to keep your urine clear or pale yellow.  Be physically active every day. This includes: ? Aerobic exercise like running or swimming. ? Leisure activities like gardening, walking, or housework.  Get 7-8 hours of sleep each night.  Reading food labels   Check the serving size of packaged foods. For foods such as rice and pasta, the serving size refers to the amount of cooked product, not dry.  Check the total fat in packaged foods. Avoid foods that have saturated fat or trans fats.  Check the ingredients list for added sugars, such as corn syrup. Shopping  At the grocery store, buy most of your food from the areas near the walls of the store. This includes: ? Fresh fruits and vegetables (produce). ? Grains, beans, nuts, and seeds. Some of these may be available in unpackaged forms or large amounts (in bulk). ? Fresh seafood. ? Poultry and eggs. ? Low-fat dairy products.  Buy whole ingredients instead of prepackaged foods.  Buy fresh fruits and vegetables in-season from local farmers markets.  Buy frozen fruits and vegetables in resealable bags.  If you do not have access to quality fresh seafood, buy precooked frozen shrimp or canned fish, such as tuna, salmon, or sardines.  Buy small amounts of raw or cooked vegetables, salads, or olives from the deli or salad bar at your store.  Stock your pantry so you always have certain foods on hand, such as olive oil, canned tuna, canned tomatoes, rice, pasta, and beans. Cooking  Cook  foods with extra-virgin olive oil instead of using butter or other vegetable oils.  Have meat as a side dish, and have vegetables or grains as your main dish. This means having meat in small portions or adding small amounts of meat to foods like pasta or stew.  Use beans or vegetables instead of meat in common dishes like chili or lasagna.  Experiment with different cooking methods. Try roasting or broiling vegetables instead of steaming or sauteing them.  Add frozen vegetables to soups, stews, pasta, or rice.  Add nuts or seeds for added healthy fat at each meal. You can add these to yogurt, salads, or vegetable dishes.  Marinate fish or vegetables using olive oil, lemon juice, garlic, and fresh herbs. Meal planning   Plan to eat 1 vegetarian meal one day each week. Try to work up to 2 vegetarian meals, if possible.  Eat seafood 2 or more times a week.  Have healthy snacks readily available, such as: ? Vegetable sticks with hummus. ? [place] yogurt. ? Fruit and nut trail mix.  Eat balanced meals throughout the week. This includes: ? Fruit: 2-3 servings a day ? Vegetables: 4-5 servings a day ? Low-fat dairy: 2 servings a day ? Fish, poultry, or lean meat: 1 serving a day ? Beans and legumes: 2 or more servings a week ? Nuts and seeds: 1-2 servings a day ? Whole grains: 6-8 servings a day ? Extra-virgin olive oil: 3-4 servings a day  Limit red meat and sweets to only a few servings a month What  are my food choices?  Mediterranean diet ? Recommended  Grains: Whole-grain pasta. Brown rice. Bulgar wheat. Polenta. Couscous. Whole-wheat bread. [place].  Vegetables: Artichokes. Beets. Broccoli. Cabbage. Carrots. Eggplant. Green beans. Chard. Kale. Spinach. Onions. Leeks. Peas. Squash. Tomatoes. Peppers. Radishes.  Fruits: Apples. Apricots. Avocado. Berries. Bananas. Cherries. Dates. Figs. Grapes. Lemons. Melon. Oranges. Peaches. Plums. Pomegranate.  Meats and other  protein foods: Beans. Almonds. Sunflower seeds. Pine nuts. Peanuts. [place]. Salmon. Scallops. Shrimp. [place]. Tilapia. Clams. Oysters. Eggs.  Dairy: Low-fat milk. Cheese. Greek yogurt.  Beverages: Water. Red wine. Herbal tea.  Fats and oils: Extra virgin olive oil. Avocado oil. Grape seed oil.  Sweets and desserts: [place] yogurt with honey. Baked apples. Poached pears. Trail mix.  Seasoning and other foods: Basil. Cilantro. Coriander. Cumin. Mint. Parsley. Sage. Rosemary. Tarragon. Garlic. Oregano. Thyme. Pepper. Balsalmic vinegar. Tahini. Hummus. Tomato sauce. Olives. Mushrooms. ? Limit these  Grains: Prepackaged pasta or rice dishes. Prepackaged cereal with added sugar.  Vegetables: Deep fried potatoes (french fries).  Fruits: Fruit canned in syrup.  Meats and other protein foods: Beef. Pork. Lamb. Poultry with skin. Hot dogs. [place] Salines.  Dairy: Ice cream. Sour cream. Whole milk.  Beverages: Juice. Sugar-sweetened soft drinks. Beer. Liquor and spirits.  Fats and oils: Butter. Canola oil. Vegetable oil. Beef fat (tallow). Lard.  Sweets and desserts: Cookies. Cakes. Pies. Candy.  Seasoning and other foods: Mayonnaise. Premade sauces and marinades. The items listed may not be a complete list. Talk with your dietitian about what dietary choices are right for you. Summary  The Mediterranean diet includes both food and lifestyle choices.  Eat a variety of fresh fruits and vegetables, beans, nuts, seeds, and whole grains.  Limit the amount of red meat and sweets that you eat.  This information is not intended to replace advice given to you by your health care provider. Make sure you discuss any questions you have with your health care provider. Document Revised: 02/08/2016 Document Reviewed: 02/01/2016 Elsevier Patient Education  [place].  We will contact you with lab results and send in refill on statin at appropriate dose. Remain well hydrated and follow heart healthy diet.  Continue close follow-up with Neurology and Pyschiatry. Follow-up with primary care in 6 months for physical. Continue to social distance and wear a mask when in public. Basic information for mRNA COVID-19 vaccines Please note that these CDC guidelines can change from week to week, and even day to day.  Refer to the [facility] website for the most current recommendations for any given day at GeekRegister.com.ee   1) Under the EUAs (emergency use authorization), the following age groups are authorized to receive the COVID-19 vaccination: . Pfizer-BioNTech: ages ?16 years . Moderna: ages ?18 years . Children and adolescents outside of these authorized age groups should not receive COVID-19 vaccination at this time   2) You are not eligible to receive a COVID-19 vaccine if: . You have received another vaccine in the last 14-days (example: flu shot, shingles, tetanus, etc.) . You currently have a positive test for COVID-19. The vaccine must be deferred until you have recovered from the acute illness and the criteria has been met for you to discontinue isolation. . You have received passive antibody therapy (monoclonal antibodies or convalescent serum) as treatment for COVID-19 within the past 90 days   3) Currently COVID-19 vaccines are contraindicated in patients who have had an anaphylactic reaction to polyethylene glycol ( ie. GoLYTELY bowel prep or MiraLAX ) or polysorbate products (often used  as a thickening agent for many ice creams, frozen custard and whipped dessert products etc).   Also, if you had an anaphylactic reaction to your first COVID-19 vaccination, you cannot receive your second.     - Also if you are currently pregnant please check with your obstetrician to see what their recommendations are regarding vaccination.   - Also, if you have a chronic condition such as CLL, RA or other immunocompromising conditions, please know that no data are currently available (or  very limited data) on the safety and efficacy of mRNA COVID-19 vaccines in persons with autoimmune/ immunocompromising conditions.   Please seek the advice of your specialist regarding whether or not you should receive your COVID-19 vaccination.   GREAT TO SEE YOU!

## 2019-07-28 NOTE — Assessment & Plan Note (Signed)
Vit D level drawn today

## 2019-07-29 ENCOUNTER — Other Ambulatory Visit: Payer: Self-pay | Admitting: Adult Health

## 2019-07-29 LAB — CBC WITH DIFFERENTIAL/PLATELET
Basophils Absolute: 0 10*3/uL (ref 0.0–0.2)
Basos: 1 %
EOS (ABSOLUTE): 0.1 10*3/uL (ref 0.0–0.4)
Eos: 2 %
Hematocrit: 46.8 % (ref 37.5–51.0)
Hemoglobin: 16.4 g/dL (ref 13.0–17.7)
Immature Grans (Abs): 0 10*3/uL (ref 0.0–0.1)
Immature Granulocytes: 0 %
Lymphocytes Absolute: 1.9 10*3/uL (ref 0.7–3.1)
Lymphs: 26 %
MCH: 31.7 pg (ref 26.6–33.0)
MCHC: 35 g/dL (ref 31.5–35.7)
MCV: 91 fL (ref 79–97)
Monocytes Absolute: 0.8 10*3/uL (ref 0.1–0.9)
Monocytes: 11 %
Neutrophils Absolute: 4.4 10*3/uL (ref 1.4–7.0)
Neutrophils: 60 %
Platelets: 257 10*3/uL (ref 150–450)
RBC: 5.17 x10E6/uL (ref 4.14–5.80)
RDW: 12.4 % (ref 11.6–15.4)
WBC: 7.4 10*3/uL (ref 3.4–10.8)

## 2019-07-29 LAB — COMPREHENSIVE METABOLIC PANEL
ALT: 49 IU/L — ABNORMAL HIGH (ref 0–44)
AST: 31 IU/L (ref 0–40)
Albumin/Globulin Ratio: 1.7 (ref 1.2–2.2)
Albumin: 4.6 g/dL (ref 4.0–5.0)
Alkaline Phosphatase: 89 IU/L (ref 39–117)
BUN/Creatinine Ratio: 13 (ref 9–20)
BUN: 12 mg/dL (ref 6–24)
Bilirubin Total: 0.7 mg/dL (ref 0.0–1.2)
CO2: 22 mmol/L (ref 20–29)
Calcium: 10.1 mg/dL (ref 8.7–10.2)
Chloride: 102 mmol/L (ref 96–106)
Creatinine, Ser: 0.93 mg/dL (ref 0.76–1.27)
GFR calc Af Amer: 113 mL/min/{1.73_m2} (ref >59)
GFR calc non Af Amer: 97 mL/min/{1.73_m2} (ref >59)
Globulin, Total: 2.7 g/dL (ref 1.5–4.5)
Glucose: 108 mg/dL — ABNORMAL HIGH (ref 65–99)
Potassium: 4.5 mmol/L (ref 3.5–5.2)
Sodium: 140 mmol/L (ref 134–144)
Total Protein: 7.3 g/dL (ref 6.0–8.5)

## 2019-07-29 LAB — HEMOGLOBIN A1C
Est. average glucose Bld gHb Est-mCnc: 108 mg/dL
Hgb A1c MFr Bld: 5.4 % (ref 4.8–5.6)

## 2019-07-29 LAB — TSH: TSH: 1.05 u[IU]/mL (ref 0.450–4.500)

## 2019-07-29 LAB — LIPID PANEL
Chol/HDL Ratio: 4.3 ratio (ref 0.0–5.0)
Cholesterol, Total: 141 mg/dL (ref 100–199)
HDL: 33 mg/dL — ABNORMAL LOW (ref >39)
LDL Chol Calc (NIH): 63 mg/dL (ref 0–99)
Triglycerides: 281 mg/dL — ABNORMAL HIGH (ref 0–149)
VLDL Cholesterol Cal: 45 mg/dL — ABNORMAL HIGH (ref 5–40)

## 2019-07-29 LAB — VITAMIN D 25 HYDROXY (VIT D DEFICIENCY, FRACTURES): Vit D, 25-Hydroxy: 19 ng/mL — ABNORMAL LOW (ref 30.0–100.0)

## 2019-07-29 MED ORDER — VITAMIN D (ERGOCALCIFEROL) 1.25 MG (50000 UNIT) PO CAPS: 50000.0000 [IU] | ORAL_CAPSULE | ORAL | 0 refills | Status: DC

## 2019-07-29 MED ORDER — OMEGA-3-ACID ETHYL ESTERS 1 G PO CAPS: ORAL_CAPSULE | ORAL | 0 refills | Status: DC

## 2019-07-29 MED ORDER — ATORVASTATIN CALCIUM 40 MG PO TABS: ORAL_TABLET | ORAL | 0 refills | Status: DC

## 2019-07-29 MED FILL — ATORVASTATIN 40 MG TABLET: 40 | 90 days supply | Qty: 90 | Fill #0

## 2019-07-29 MED FILL — OMEGA-3 ETHYL ESTERS 1 GM C: 1 | 60 days supply | Qty: 240 | Fill #0

## 2019-07-29 MED FILL — VIT D2 1.25 MG (50,000 UNIT: 1.25 MG | 84 days supply | Qty: 12 | Fill #0

## 2019-08-02 ENCOUNTER — Other Ambulatory Visit: Payer: Self-pay | Admitting: Adult Health

## 2019-08-02 MED FILL — OMEPRAZOLE 20 MG CAP: 20 | 90 days supply | Qty: 90 | Fill #0

## 2019-08-19 NOTE — Telephone Encounter (Signed)
Spoke with patient to assist with Carelink re-enrollment and monitor setup. Confirmed that he would like to resume monitoring (including monthly billing), pt in agreement. 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 However, he would like to rule out cardiac issues.  Monitor setup. Reader needs to charge. Transmission instructions provided. Pt agrees to try transmitting in 20-30 minutes. Will call back later today if not received. Pt in agreement with plan.

## 2019-08-30 ENCOUNTER — Ambulatory Visit (INDEPENDENT_AMBULATORY_CARE_PROVIDER_SITE_OTHER): Payer: 59 | Admitting: Neurology

## 2019-08-30 ENCOUNTER — Other Ambulatory Visit: Payer: Self-pay

## 2019-08-30 ENCOUNTER — Encounter: Payer: Self-pay | Admitting: Neurology

## 2019-08-30 VITALS — BP 151/86 | Temp 97.8°F | Ht 72.0 in | Wt 210.6 lb

## 2019-08-30 DIAGNOSIS — G3184 Mild cognitive impairment, so stated: Secondary | ICD-10-CM

## 2019-08-30 NOTE — Progress Notes (Signed)
[facility] [address] [place]. [place] [address] [phone]       OFFICE FOLLOW-UP NOTE  Mr. Steven Herrera Date of Birth:  11/18/1970 Medical Record Number:  [id number]   HPI: Steven Herrera is a 49 year old Caucasian male seen today for the first office follow-up visit following hospital admission for stroke in January 2019.history is obtained from the patient and review of electronic medical records. I have personally reviewed imaging films.Steven Herrera an 49 y.o.malewith history of hypercholesterolemia, carpal tunnel,depression and anxiety. Patient presented to the emergency department on 3 January of 2018 for difficulty with hand eye coordination. She apparently took a nap on 25 June 2017 but upon waking he had some confusion, he noticed some difficulty using the cell phone as far as difficulty pushing the numbers and using it correctly. However this did make him nervous as his family has a history of strokes that she reported to the emergency department. Upon entering the patient's blood pressure was stable, chemistry panel and CBC were stable however CT of the head demonstrated a low-attenuation in the left basal ganglia which was suspicious for a possible acute ischemic infarct. This is followed up by an MRI of the brain which did not show any acute/subacute nonhemorrhagic infarct involving the leftlentiform nucleus and internal capsule along with a left caudate head. MRA was nonrevealing. Patient's LDL was unable to be calculated secondary to elevated triglycerides.  Date last known well:Date:06/25/2017 Time last known well:Time:10:00am tPA Given:No:Out of the window. Patient had been noncompliant with his medications and was smoking and drinking heavily. MRI scan of the brain showed left basal gangliaand subinsular large subcortical infarct. MRA of the brain showed no significant large vessel stenosis. Carotid ultrasound showed no  significant extracranial stenosis. Transthoracic echo showed normal ejection fraction. LDL could not be calculated as his triglycerides were quite high. Patient was not on aspirin. A single physical occupational therapy. He is very minimum deficits. He was discharged home and came back for an outpatient transesophageal echocardiogram which was done on 06/27/17 and showed a atrial septal aneurysm and patent foramen ovale. He subsequently also had outpatient loop recorder inserted on 09/25/17 by Dr. Rayann Herrera insofar paroxysmal A. Fib has not yet been found. The patient does give a family history of multiple strokes in his father which began in the 68s. He denies any history of deep vein thrombosis, pulmonary embolism or migraines. 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 There is no family history of strokes at a young age. Update 05/24/2019 ; he returns for follow-up after last visit a year and half ago.  He underwent successful endovascular PFO closure by Dr. Burt Herrera on 11/19/2017.  He has done well post closure.  He had follow-up 2D echo with bubble study done confirming closure but did not have a TCD bubble study as of yet.  Is tolerating aspirin well without bleeding or bruising.  Is also on Lipitor and tolerating well without muscle aches and pains.  Patient has been complaining of progressive memory difficulties for the last few months.  He is forgets recent information and often misplaces objects and has to spend a lot of time trying to find them.  However he seems to be able to focus and work quite well as a English as a second language teacher.  He has had no problems on his work and  his work quality has not suffered.  He denies feeling depressed.  He denies any headaches, focal gait balance or vision problems.  He is on lamotrigine for bipolar disorder and sees a psychiatrist.  Update 08/30/2019 ; He returns for  follow-up after last visit 3 months ago.  He continues to have short-term memory and cognitive difficulties.  He finds it difficult to stay in conversations and loses train of thought and has problems with multitasking and having sustained attention.  He also complains of his hand being tremulous and shaky all over to the point that he has stopped working as a English as a second language teacher anymore.  He also feels that at times his balance is off though is had no falls or injuries.  Is also noticed that his vision is not the same and he has trouble seeing.  He has not seen an eye doctor to see if he has any refractive correction or not.  Patient also tapered and stopped his bipolar medications after discussion with his therapist over the last 4 months.  However he does admit that he may be feeling little depressed and antsy and plans to see him again next week to discuss restarting some of his medicines.  I had advised the patient to do lab work for reversible causes of memory loss and MRI scan at last visit but he has not done so and is not able to tell me why not.  He did however undergo an EEG on 07/05/2019 which I have read and was normal.  His primary care physician ordered some labs on 07/28/2019 which showed low vitamin D of 119.0 for which she has been started on replacement.  TSH, hemoglobin A1c and LDL were normal.  Triglycerides were elevated at 281 mg percent.  Patient also had a transcranial Doppler bubble study to check adequacy of PFO closure on 06/11/2019 which was negative. ROS:   14 system review of systems is positive for memory loss, word finding difficulty,neck pain and stiffness and all other systems negative  PMH:  Past Medical History:  Diagnosis Date  . Depression   . High cholesterol   . PFO (patent foramen ovale)    a. s/p PFO closure by Dr. Burt Herrera on 11/19/17  . Stroke [facility])     Social History:  Social History   Socioeconomic History  . Marital status: Married    Spouse name: Not on file  .  Number of children: 2  . Years of education: Not on file  . Highest education level: 8th grade  Occupational History  . Not on file  Tobacco Use  . Smoking status: Former Smoker    Packs/day: 0.25    Years: 28.00    Pack years: 7.00    Types: Cigarettes    Quit date: 07/25/2017    Years since quitting: 2.0  . Smokeless tobacco: Never Used  Substance and Sexual Activity  . Alcohol use: Yes    Alcohol/week: 6.0 standard drinks    Types: 6 Cans of beer per week    Comment: 6 pack nightly  . Drug use: Yes    Types: Marijuana    Comment: takes daily for pain leisure   . Sexual activity: Yes    Birth control/protection: Surgical  Other Topics Concern  . Not on file  Social History Narrative  . Not on file   Social Determinants of Health   Financial Resource Strain:   . Difficulty of Paying Living Expenses: Not on file  Food Insecurity:   .  Worried About Charity fundraiser in the Last Year: Not on file  . Ran Out of Food in the Last Year: Not on file  Transportation Needs:   . Lack of Transportation (Medical): Not on file  . Lack of Transportation (Non-Medical): Not on file  Physical Activity:   . Days of Exercise per Week: Not on file  . Minutes of Exercise per Session: Not on file  Stress:   . Feeling of Stress : Not on file  Social Connections:   . Frequency of Communication with Friends and Family: Not on file  . Frequency of Social Gatherings with Friends and Family: Not on file  . Attends Religious Services: Not on file  . Active Member of Clubs or Organizations: Not on file  . Attends Archivist Meetings: Not on file  . Marital Status: Not on file  Intimate Partner Violence:   . Fear of Current or Ex-Partner: Not on file  . Emotionally Abused: Not on file  . Physically Abused: Not on file  . Sexually Abused: Not on file    Medications:   Current Outpatient Medications on File Prior to Visit  Medication Sig Dispense Refill  . aspirin EC 81 MG  tablet Take 81 mg by mouth daily with lunch.     Marland Kitchen atorvastatin (LIPITOR) 40 MG tablet TAKE 1 TABLET BY MOUTH ONCE DAILY AT 6 PM **NEEDS APPOINTMENT FOR FURTHER REFILLS** 90 tablet 0  . Menthol, Topical Analgesic, (ICY HOT EX) Apply 1 application topically 3 (three) times daily as needed (pain).     . Multiple Vitamin (MULTIVITAMIN WITH MINERALS) TABS tablet Take 1 tablet by mouth daily with lunch.    . naproxen sodium (ALEVE) 220 MG tablet Take 220-440 mg by mouth 3 (three) times daily as needed (pain/headaches).    Marland Kitchen omega-3 acid ethyl esters (LOVAZA) 1 g capsule TAKE 2 CAPSULES BY MOUTH 2 TIMES DAILY. 240 capsule 0  . omeprazole (PRILOSEC) 20 MG capsule Take 1 capsule (20 mg total) by mouth daily. 90 capsule 0  . Vitamin D, Ergocalciferol, (DRISDOL) 1.25 MG (50000 UNIT) CAPS capsule Take 1 capsule (50,000 Units total) by mouth every 7 (seven) days. 16 capsule 0   No current facility-administered medications on file prior to visit.    Allergies:   Allergies  Allergen Reactions  . Ibuprofen Other (See Comments)    Bloody stools  . Penicillins Hives    Has patient had a PCN reaction causing immediate rash, facial/tongue/throat swelling, SOB or lightheadedness with hypotension: No Has patient had a PCN reaction causing severe rash involving mucus membranes or skin necrosis: Yes Has patient had a PCN reaction that required hospitalization: no Has patient had a PCN reaction occurring within the last 10 years: no If all of the above answers are "NO", then may proceed with Cephalosporin use.    Physical Exam Herrera: well developed, well nourished heavily body tattooed young Caucasian male, seated, in no evident distress Head: head normocephalic and atraumatic.  Neck: supple with no carotid or supraclavicular bruits Cardiovascular: regular rate and rhythm, no murmurs Musculoskeletal: no deformity Skin:  no rash/petichiae multiple body tattoos all over Vascular:  Normal pulses all  extremities Vitals:   08/30/19 1444  BP: (!) 151/86  Temp: 97.8 F (36.6 C)   Neurologic Exam Mental Status: Awake and fully alert. Oriented to place and time. Recent and remote memory intact. Attention span, concentration and fund of knowledge appropriate. Mood and affect appropriate.  Diminished recall 2/3.  Able to name 13 animals which can walk on 4 legs.  Clock drawing 4/4 does not appear to be depressed Cranial Nerves: Fundoscopic exam not done. Pupils equal, briskly reactive to light. Extraocular movements full without nystagmus. Visual fields full to confrontation. Hearing intact. Facial sensation intact. Face, tongue, palate moves normally and symmetrically.  Motor: Normal bulk and tone. Normal strength in all tested extremity muscles. Sensory.: intact to touch ,pinprick .position and vibratory sensation.  Coordination: Rapid alternating movements normal in all extremities. Finger-to-nose and heel-to-shin performed accurately bilaterally. Gait and Station: Arises from chair without difficulty. Stance is normal. Gait demonstrates normal stride length and balance . Able to heel, toe and tandem walk without difficulty.  Reflexes: 1+ and symmetric. Toes downgoing.       ASSESSMENT: 49 year old Caucasian male with cryptogenic left basal ganglia infarct and January 2019 with vascular risk factors of tobacco abuse, hyperlipidemia, heavy alcohol intake and patent foramen ovale.  New complaints of memory and cognitive difficulties likely due to mild cognitive impairment.    PLAN: I had a long discussion with the patient regarding his mild memory and cognitive difficulties and recommend he do memory panel labs today as well as MRI scan of the brain to rule out interval new strokes or other structural lesions.  We also discussed memory compensation strategies as well as I recommend he increase participation in cognitively challenging activities like solving crossword puzzles, playing bridge and  sodoku.  He will stay on aspirin for stroke prevention and maintain aggressive risk factor modification with strict control of hypertension with blood pressure goal below 130/90 and lipids with LDL cholesterol goal below 40 mg percent.  I also advised him to see his therapist and consider possibly restarting his psychiatric medications if necessary.  He will return for follow-up in the future in 3 months or call earlier if necessary. Greater than 50% of time during this 30 minute visit was spent on counseling,explanation of diagnosis of cryptogenic stroke and PFO, planning of further management, discussion with patient and family and coordination of care Antony Contras, MD  [facility] [address] [place], [place] [address]  Phone [phone] Fax [phone] Note: This document was prepared with digital dictation and possible smart phrase technology. Any transcriptional errors that result from this process are unintentional

## 2019-08-30 NOTE — Patient Instructions (Signed)
I had a long discussion with the patient regarding his mild memory and cognitive difficulties and recommend he do memory panel labs today as well as MRI scan of the brain to rule out interval new strokes or other structural lesions.  We also discussed memory compensation strategies as well as I recommend he increase participation in cognitively challenging activities like solving crossword puzzles, playing bridge and sodoku.  He will stay on aspirin for stroke prevention and maintain aggressive risk factor modification with strict control of hypertension with blood pressure goal below 130/90 and lipids with LDL cholesterol goal below 40 mg percent.  I also advised him to see his therapist and consider possibly restarting his psychiatric medications if necessary.  He will return for follow-up in the future in 3 months or call earlier if necessary. Memory Compensation Strategies  1. Use "WARM" strategy.  W= write it down  A= associate it  R= repeat it  M= make a mental note  2.   You can keep a Social worker.  Use a 3-ring notebook with sections for the following: calendar, important names and phone numbers,  medications, doctors' names/phone numbers, lists/reminders, and a section to journal what you did  each day.   3.    Use a calendar to write appointments down.  4.    Write yourself a schedule for the day.  This can be placed on the calendar or in a separate section of the Memory Notebook.  Keeping a  regular schedule can help memory.  5.    Use medication organizer with sections for each day or morning/evening pills.  You may need help loading it  6.    Keep a basket, or pegboard by the door.  Place items that you need to take out with you in the basket or on the pegboard.  You may also want to  include a message board for reminders.  7.    Use sticky notes.  Place sticky notes with reminders in a place where the task is performed.  For example: " turn off the  stove" placed by the  stove, "lock the door" placed on the door at eye level, " take your medications" on  the bathroom mirror or by the place where you normally take your medications.  8.    Use alarms/timers.  Use while cooking to remind yourself to check on food or as a reminder to take your medicine, or as a  reminder to make a call, or as a reminder to perform another task, etc.

## 2019-08-31 LAB — DEMENTIA PANEL
Homocysteine: 9.3 umol/L (ref 0.0–14.5)
RPR Ser Ql: NONREACTIVE
TSH: 0.653 u[IU]/mL (ref 0.450–4.500)
Vitamin B-12: 547 pg/mL (ref 232–1245)

## 2019-08-31 NOTE — Progress Notes (Signed)
Kindly inform the patient that lab work for reversible causes of memory loss was normal

## 2019-09-06 ENCOUNTER — Telehealth: Payer: Self-pay | Admitting: Neurology

## 2019-09-06 NOTE — Telephone Encounter (Signed)
spoke to pt he is going to give me a call back  [facility] Auth: Amber Ref # [id number]

## 2019-09-08 NOTE — Telephone Encounter (Signed)
Patient called back she is scheduled at [facility] for 09/14/19.

## 2019-09-14 ENCOUNTER — Ambulatory Visit: Payer: 59

## 2019-09-14 ENCOUNTER — Other Ambulatory Visit: Payer: Self-pay

## 2019-09-14 DIAGNOSIS — G3184 Mild cognitive impairment, so stated: Secondary | ICD-10-CM | POA: Diagnosis not present

## 2019-09-14 MED ORDER — GADOBENATE DIMEGLUMINE 529 MG/ML IV SOLN
20.0000 mL | Freq: Once | INTRAVENOUS | Status: AC | PRN
  Administered 2019-09-14: 20 mL via INTRAVENOUS

## 2019-09-14 NOTE — Progress Notes (Signed)
Please call patient and advise him that his recent brain MRI did not show any new findings, chronic left stroke was demonstrated which is not a new finding.

## 2019-09-15 ENCOUNTER — Telehealth: Payer: Self-pay | Admitting: *Deleted

## 2019-09-15 NOTE — Telephone Encounter (Signed)
-----   Message from Star Age, MD sent at 09/14/2019  5:01 PM EDT ----- Please call patient and advise him that his recent brain MRI did not show any new findings, chronic left stroke was demonstrated which is not a new finding.

## 2019-09-15 NOTE — Telephone Encounter (Signed)
I called the patient. He verbalized understanding of his MRI brain results.

## 2019-09-16 ENCOUNTER — Ambulatory Visit (INDEPENDENT_AMBULATORY_CARE_PROVIDER_SITE_OTHER): Payer: 59 | Admitting: Psychiatry

## 2019-09-16 ENCOUNTER — Other Ambulatory Visit: Payer: Self-pay

## 2019-09-16 ENCOUNTER — Encounter (HOSPITAL_COMMUNITY): Payer: Self-pay | Admitting: Psychiatry

## 2019-09-16 DIAGNOSIS — F319 Bipolar disorder, unspecified: Secondary | ICD-10-CM | POA: Diagnosis not present

## 2019-09-16 DIAGNOSIS — F419 Anxiety disorder, unspecified: Secondary | ICD-10-CM | POA: Diagnosis not present

## 2019-09-16 DIAGNOSIS — F0630 Mood disorder due to known physiological condition, unspecified: Secondary | ICD-10-CM | POA: Diagnosis not present

## 2019-09-16 DIAGNOSIS — G3184 Mild cognitive impairment, so stated: Secondary | ICD-10-CM | POA: Diagnosis not present

## 2019-09-16 IMAGING — DX PORTABLE CHEST - 1 VIEW
1 series · 1 of 1 positions shown · non-contrast
Comparison: None.

CLINICAL DATA: Short of breath

EXAM:
PORTABLE CHEST 1 VIEW

[chest ap]
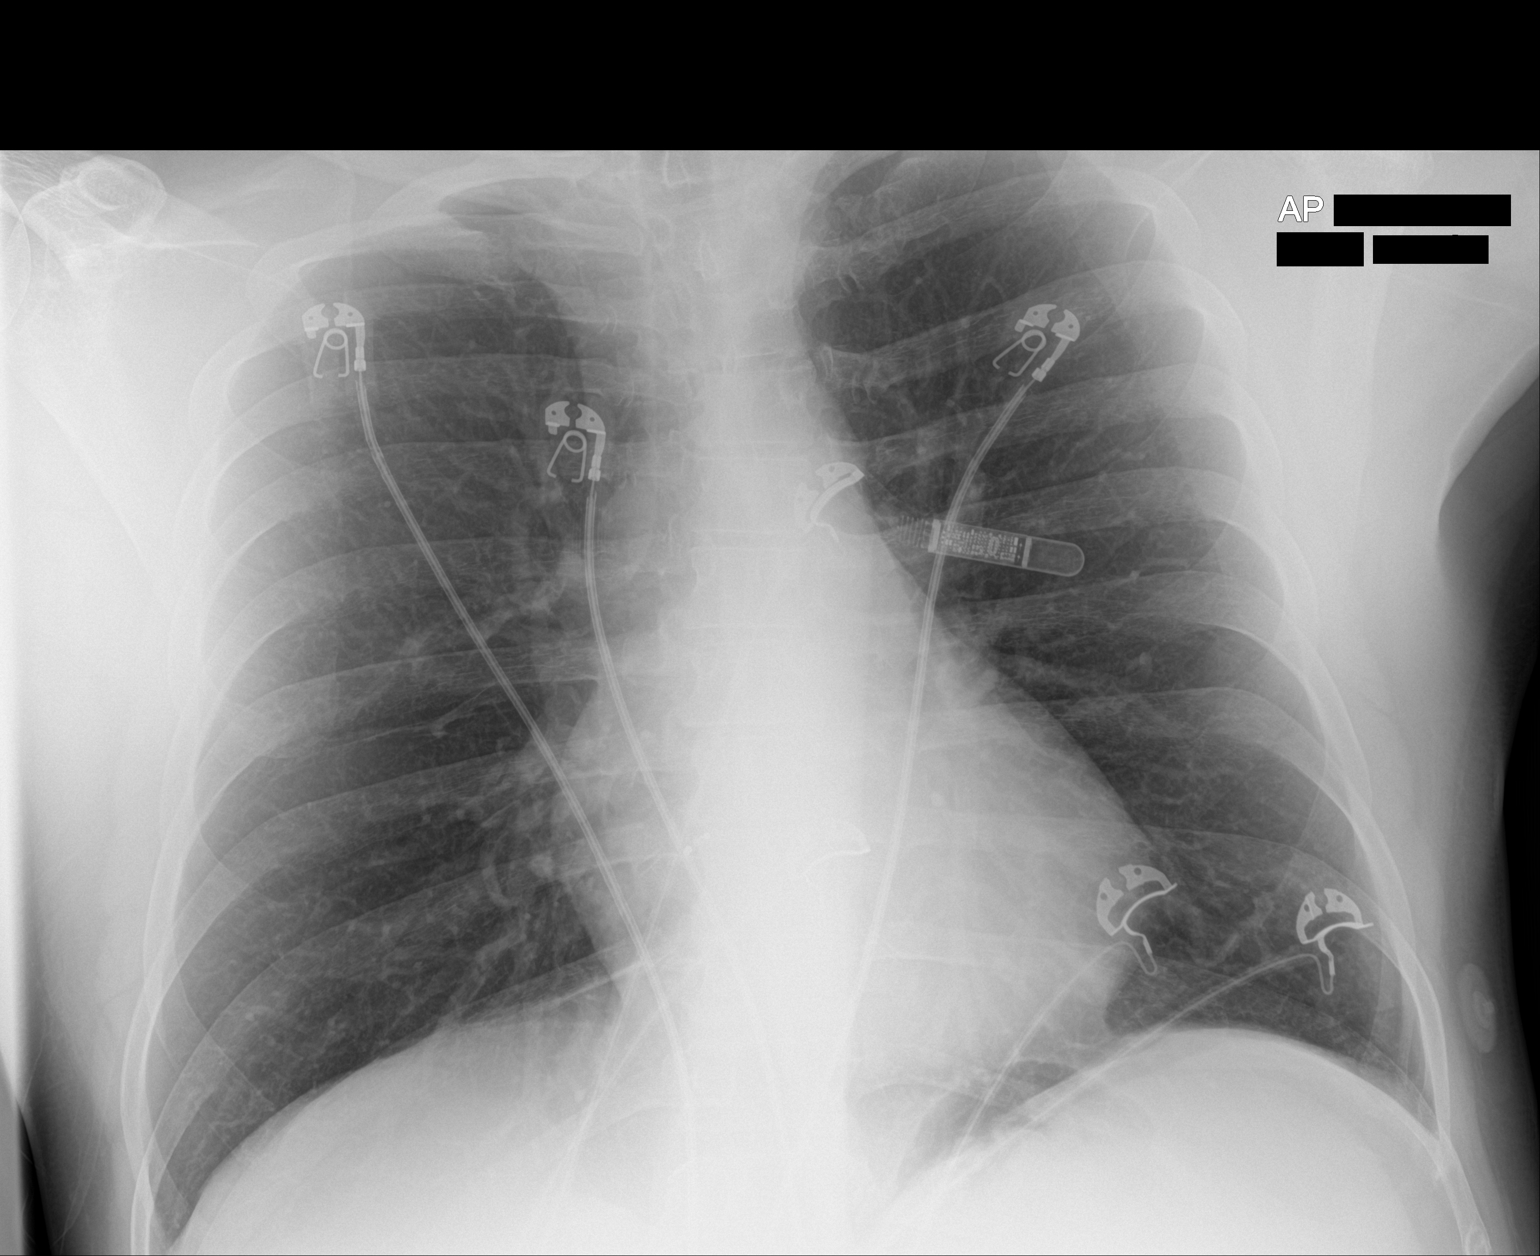

[1 of 1 positions shown; findings below may reference images not displayed]

FINDINGS: Heart size and vascularity normal.  Loop recorder noted.

Lungs well aerated without infiltrate effusion or mass.
IMPRESSION: No active disease.

## 2019-09-16 MED ORDER — QUETIAPINE FUMARATE 100 MG PO TABS: 100.0000 mg | ORAL_TABLET | Freq: Two times a day (BID) | ORAL | 2 refills | Status: DC

## 2019-09-16 MED ORDER — CLONAZEPAM 0.5 MG PO TABS: 0.5000 mg | ORAL_TABLET | Freq: Two times a day (BID) | ORAL | 0 refills | Status: DC

## 2019-09-16 MED FILL — clonazePAM 0.5 MG TABS: 0.5 | 30 days supply | Qty: 60 | Fill #0

## 2019-09-16 MED FILL — QUETIAPINE FUMARATE 100 MG: 100 | 30 days supply | Qty: 60 | Fill #0

## 2019-09-16 NOTE — Progress Notes (Signed)
Virtual Visit via Telephone Note  I connected with Steven Herrera on 09/16/19 at  1:40 PM EDT by telephone and verified that I am speaking with the correct person using two identifiers.   I discussed the limitations, risks, security and privacy concerns of performing an evaluation and management service by telephone and the availability of in person appointments. I also discussed with the patient that there may be a patient responsible charge related to this service. The patient expressed understanding and agreed to proceed.   History of Present Illness: Patient was evaluated by phone session.  He requested earlier appointment because he noticed getting frustrated, anxious and having panic attacks.  He resumed his Lamictal and Seroquel because he noticed having depression, anxiety, poor sleep and anger coming back.  He is very frustrated because he did not see any improvement after stopping Lamictal and Seroquel in his memory and shakes.  He had comprehensive work-up from neurology including EEG, dementia panel which was normal.  Patient has cryptogenic basal ganglia infarct in 2019.  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  He is working but he is very concerned about his finances because the symptoms of memory problem and tremors may not let him to work longer.  He also reported having balance issues but denies any fall.  He want to reevaluate his psychiatric medication so he can try something which does not have side effects of memory problem tremors and balance issues.  He admitted lately getting more easily upset, anxious and having panic attacks.  He had to stop marijuana to see if that can help his memory.  He admitted stopping all his psych medication because he wanted to give a new start but realized his mood symptoms are coming back.  He reported his energy level is  fair.  But he has difficulty in attention, concentration and remembering things.  Denies any hallucination, paranoia, suicidal thoughts or homicidal thought.  His appetite is okay.    Past Psychiatric History:Reviewed. H/Obriefly seeing therapist at [facility] inyoung age. H/Omood swing, anger, agitation, hyperactivity and running away. [place] acid, LSD, cocaine, marijuana and heavy drinking. Tried Zoloft, Lexapro, melatonin and Xanax did not like it. Took Paxil and Klonopin and Wellbutrin from PCP. We tried Lamictal.  Recent Results (from the past 2160 hour(s))  CBC with Differential/Platelet     Status: None   Collection Time: 07/28/19 11:10 AM  Result Value Ref Range   WBC 7.4 3.4 - 10.8 x10E3/uL   RBC 5.17 4.14 - 5.80 x10E6/uL   Hemoglobin 16.4 13.0 - 17.7 g/dL   Hematocrit 46.8 37.5 - 51.0 %   MCV 91 79 - 97 fL   MCH 31.7 26.6 - 33.0 pg   MCHC 35.0 31.5 - 35.7 g/dL   RDW 12.4 11.6 - 15.4 %   Platelets 257 150 - 450 x10E3/uL   Neutrophils 60 Not Estab. %   Lymphs 26 Not Estab. %   Monocytes 11 Not Estab. %   Eos 2 Not Estab. %   Basos 1 Not Estab. %   Neutrophils Absolute 4.4 1.4 - 7.0 x10E3/uL   Lymphocytes Absolute 1.9 0.7 - 3.1 x10E3/uL   Monocytes Absolute 0.8 0.1 - 0.9 x10E3/uL   EOS (ABSOLUTE) 0.1 0.0 - 0.4 x10E3/uL   Basophils Absolute 0.0 0.0 - 0.2 x10E3/uL   Immature Granulocytes 0 Not  Estab. %   Immature Grans (Abs) 0.0 0.0 - 0.1 x10E3/uL  Comprehensive metabolic panel     Status: Abnormal   Collection Time: 07/28/19 11:10 AM  Result Value Ref Range   Glucose 108 (H) 65 - 99 mg/dL   BUN 12 6 - 24 mg/dL   Creatinine, Ser 0.93 0.76 - 1.27 mg/dL   GFR calc non Af Amer 97 >59 mL/min/1.73   GFR calc Af Amer 113 >59 mL/min/1.73   BUN/Creatinine Ratio 13 9 - 20   Sodium 140 134 - 144 mmol/L   Potassium 4.5 3.5 - 5.2 mmol/L   Chloride 102 96 - 106 mmol/L   CO2 22 20 - 29 mmol/L   Calcium 10.1 8.7 - 10.2 mg/dL   Total Protein 7.3 6.0 - 8.5 g/dL   Albumin 4.6 4.0 - 5.0  g/dL   Globulin, Total 2.7 1.5 - 4.5 g/dL   Albumin/Globulin Ratio 1.7 1.2 - 2.2   Bilirubin Total 0.7 0.0 - 1.2 mg/dL   Alkaline Phosphatase 89 39 - 117 IU/L   AST 31 0 - 40 IU/L   ALT 49 (H) 0 - 44 IU/L  Hemoglobin A1c     Status: None   Collection Time: 07/28/19 11:10 AM  Result Value Ref Range   Hgb A1c MFr Bld 5.4 4.8 - 5.6 %    Comment:          Prediabetes: 5.7 - 6.4          Diabetes: >6.4          Glycemic control for adults with diabetes: <7.0    Est. average glucose Bld gHb Est-mCnc 108 mg/dL  Lipid panel     Status: Abnormal   Collection Time: 07/28/19 11:10 AM  Result Value Ref Range   Cholesterol, Total 141 100 - 199 mg/dL   Triglycerides 281 (H) 0 - 149 mg/dL   HDL 33 (L) >39 mg/dL   VLDL Cholesterol Cal 45 (H) 5 - 40 mg/dL   LDL Chol Calc (NIH) 63 0 - 99 mg/dL   Chol/HDL Ratio 4.3 0.0 - 5.0 ratio    Comment:                                   T. Chol/HDL Ratio                                             Men  Women                               1/2 Avg.Risk  3.4    3.3                                   Avg.Risk  5.0    4.4                                2X Avg.Risk  9.6    7.1                                3X Avg.Risk  23.4   11.0   TSH     Status: None   Collection Time: 07/28/19 11:10 AM  Result Value Ref Range   TSH 1.050 0.450 - 4.500 uIU/mL  VITAMIN D 25 Hydroxy (Vit-D Deficiency, Fractures)     Status: Abnormal   Collection Time: 07/28/19 11:10 AM  Result Value Ref Range   Vit D, 25-Hydroxy 19.0 (L) 30.0 - 100.0 ng/mL    Comment: Vitamin D deficiency has been defined by the [facility] practice guideline as a level of serum 25-OH vitamin D less than 20 ng/mL (1,2). The Endocrine Society went on to further define vitamin D insufficiency as a level between 21 and 29 ng/mL (2). 1. IOM (Institute of Medicine). 2010. Dietary reference    intakes for calcium and D. [place]: The    [employer]. 2.  Holick MF, Binkley , Bischoff-Ferrari HA, et al.    Evaluation, treatment, and prevention of vitamin D    deficiency: an Endocrine Society clinical practice    guideline. JCEM. 2011 Jul; 96(7):1911-30.   Dementia Panel     Status: None   Collection Time: 08/30/19  3:14 PM  Result Value Ref Range   Vitamin B-12 547 232 - 1,245 pg/mL   Homocysteine 9.3 0.0 - 14.5 umol/L   TSH 0.653 0.450 - 4.500 uIU/mL   RPR Ser Ql Non Reactive Non Reactive    Psychiatric Specialty Exam: Physical Exam  Review of Systems  There were no vitals taken for this visit.There is no height or weight on file to calculate BMI.  General Appearance: NA  Eye Contact:  NA  Speech:  Clear and Coherent  Volume:  Increased  Mood:  Anxious, Irritable and Frustrated  Affect:  NA  Thought Process:  Descriptions of Associations: Intact  Orientation:  Full (Time, Place, and Person)  Thought Content:  Rumination  Suicidal Thoughts:  No  Homicidal Thoughts:  No  Memory:  Immediate;   Good Recent;   Fair Remote;   Fair  Judgement:  Fair  Insight:  Fair  Psychomotor Activity:  NA  Concentration:  Concentration: Fair and Attention Span: Fair  Recall:  [place] of Knowledge:  Good  Language:  Good  Akathisia:  reported tremors  Handed:  Right  AIMS (if indicated):     Assets:  Communication Skills Desire for Improvement Housing Resilience Social Support Talents/Skills  ADL's:  Intact  Cognition:  Impaired,  Mild  Sleep:   better with seroquel      Assessment and Plan: Bipolar disorder type I.  Anxiety.  Depression.  Cognitive impairment.  History of cannabis use.  Mood disorder due to general medical condition.  I reviewed recent neurology notes, blood work.  Patient has dementia work-up which was normal.  I explained that any psychotropic medication can cause tremors, shakes and memory impairment.  I explained that even he stopped the Lamictal and quetiapine for more than 2 to 3 months he did not  notice any improvement in his tremors and memory.  I explained it could be due to history of stroke.  However patient wants to try different medication I recommend to stop the Lamictal 100 mg and remember Klonopin help his anxiety and nervousness and we can resume 0.5 mg twice a day.  Patient reported Seroquel 100 mg is helping his sleep since he started week ago and I recommended continuing that.  However I again explained that psychotropic medication has her own side  effects which includes tremors, shakes, memory impairment and it may get worse for long-term use.  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  Patient willing to try Klonopin as he has taken in the past without any side effects.  Encourage not to restart drinking and cannabis use.  Patient has a good support from his wife.  Recommended to call us back if he has any question or any concern.  Follow-up in 4 to 6 weeks.  I also discussed his blood work results with him especially reversible causes of dementia.  Time spent 30 minutes.  More than 50% of the time spent in psychoeducation, counseling, coordination of care and doing his blood work results and long-term prognosis.    Follow Up Instructions:    I discussed the assessment and treatment plan with the patient. The patient was provided an opportunity to ask questions and all were answered. The patient agreed with the plan and demonstrated an understanding of the instructions.   The patient was advised to call back or seek an in-person evaluation if the symptoms worsen or if the condition fails to improve as anticipated.  I provided 30 minutes of non-face-to-face time during this encounter.   Kathlee Nations, MD

## 2019-09-20 ENCOUNTER — Ambulatory Visit (INDEPENDENT_AMBULATORY_CARE_PROVIDER_SITE_OTHER): Payer: 59 | Admitting: *Deleted

## 2019-09-20 DIAGNOSIS — I639 Cerebral infarction, unspecified: Secondary | ICD-10-CM

## 2019-09-20 LAB — CUP PACEART REMOTE DEVICE CHECK
Date Time Interrogation Session: 20210328120911
Implantable Pulse Generator Implant Date: 20190404

## 2019-09-21 NOTE — Progress Notes (Signed)
ILR Remote 

## 2019-09-28 ENCOUNTER — Telehealth: Payer: Self-pay | Admitting: Adult Health

## 2019-09-28 NOTE — Telephone Encounter (Signed)
Patient request referral to [facility], states Neurologist says since PT isn't resolving pt's pain/ body aches maybe OT will .  --forwarding request to med asst & referral coord.  --glh

## 2019-09-29 NOTE — Telephone Encounter (Signed)
Since I have never seen or evaluated the patient, I recommend he ask neurology for an OT referral if that is what they think he needs.  Otherwise, he will need to make a follow-up office visit with his new provider to discuss these issues further so we can assess them

## 2019-09-29 NOTE — Telephone Encounter (Signed)
Ok to place referral.

## 2019-09-30 ENCOUNTER — Telehealth: Payer: Self-pay | Admitting: Neurology

## 2019-09-30 NOTE — Telephone Encounter (Signed)
I have reviewed my patients note I have not discussed and issues with him and focused only on his memory issues.  He needs to discuss this with his primary care physician

## 2019-09-30 NOTE — Telephone Encounter (Signed)
Patient called stating he would like a referral to OT for hand issues and would like it sent to a covering facility

## 2019-09-30 NOTE — Telephone Encounter (Signed)
Pt informed.  Pt expressed understanding and is agreeable.  T. Brigitta Pricer, CMA  

## 2019-09-30 NOTE — Telephone Encounter (Signed)
I called pt that per Dr.Sethi he never discuss his hand issues or evaluate him for that. I stated we saw him for memory issues. Before I could tell pt that its recommend to seek PCP pt stated "thank you and hung up.

## 2019-10-11 ENCOUNTER — Ambulatory Visit (HOSPITAL_COMMUNITY): Payer: 59 | Admitting: Psychiatry

## 2019-10-15 ENCOUNTER — Other Ambulatory Visit (HOSPITAL_COMMUNITY): Payer: Self-pay | Admitting: Psychiatry

## 2019-10-15 DIAGNOSIS — F419 Anxiety disorder, unspecified: Secondary | ICD-10-CM

## 2019-10-15 MED FILL — QUETIAPINE FUMARATE 100 MG: 100 | 30 days supply | Qty: 60 | Fill #1

## 2019-10-15 MED FILL — VIT D2 1.25 MG (50,000 UNIT: 1.25 MG | 27 days supply | Qty: 4 | Fill #1

## 2019-10-18 ENCOUNTER — Telehealth (HOSPITAL_COMMUNITY): Payer: Self-pay | Admitting: *Deleted

## 2019-10-18 DIAGNOSIS — F419 Anxiety disorder, unspecified: Secondary | ICD-10-CM

## 2019-10-18 MED ORDER — CLONAZEPAM 0.5 MG PO TABS: 0.5000 mg | ORAL_TABLET | Freq: Two times a day (BID) | ORAL | 0 refills | Status: DC

## 2019-10-18 MED FILL — clonazePAM 0.5 MG TABS: 0.5 | 30 days supply | Qty: 60 | Fill #0

## 2019-10-18 NOTE — Telephone Encounter (Signed)
Done

## 2019-10-18 NOTE — Telephone Encounter (Signed)
Pt called requesting refill of Klonopin 0.5mg  bid last ordered on 09/16/19. Pt will be out of medication before next appointment with you on 10/28/19. Please review and advise.

## 2019-10-25 ENCOUNTER — Other Ambulatory Visit: Payer: Self-pay | Admitting: Adult Health

## 2019-10-25 ENCOUNTER — Ambulatory Visit (INDEPENDENT_AMBULATORY_CARE_PROVIDER_SITE_OTHER): Payer: 59 | Admitting: *Deleted

## 2019-10-25 DIAGNOSIS — I639 Cerebral infarction, unspecified: Secondary | ICD-10-CM

## 2019-10-25 LAB — CUP PACEART REMOTE DEVICE CHECK
Date Time Interrogation Session: 20210428123018
Implantable Pulse Generator Implant Date: 20190404

## 2019-10-26 ENCOUNTER — Other Ambulatory Visit: Payer: Self-pay | Admitting: Physician Assistant

## 2019-10-26 MED ORDER — ATORVASTATIN CALCIUM 40 MG PO TABS: ORAL_TABLET | ORAL | 0 refills | Status: DC

## 2019-10-26 MED FILL — ATORVASTATIN 40 MG TABLET: 40 | 90 days supply | Qty: 90 | Fill #0

## 2019-10-26 NOTE — Telephone Encounter (Signed)
Patient called states pharmacy advised provider denied Rx refill for :  .atorvastatin (LIPITOR) 40 MG tablet [id number]   Order Details Dose, Route, Frequency: As Directed  Dispense Quantity: 90 tablet Refills: 0       Sig: TAKE 1 TABLET BY MOUTH ONCE DAILY AT 6 PM **NEEDS APPOINTMENT FOR FURTHER REFILLS**   ---Appt scheduled for ( 12/08/19.)   --Forwarding refill request to med asst & if approved send order to :   [place], [place] [address] [phone] (Phone) [phone] (Fax)   --[name]

## 2019-10-26 NOTE — Telephone Encounter (Signed)
Medication sent to pharmacy. Left message for patient to notify of med refill. AS, CMA

## 2019-10-26 NOTE — Progress Notes (Signed)
Carelink Summary Report / Loop Recorder 

## 2019-10-28 ENCOUNTER — Other Ambulatory Visit: Payer: Self-pay

## 2019-10-28 ENCOUNTER — Encounter (HOSPITAL_COMMUNITY): Payer: Self-pay | Admitting: Psychiatry

## 2019-10-28 ENCOUNTER — Telehealth (INDEPENDENT_AMBULATORY_CARE_PROVIDER_SITE_OTHER): Payer: 59 | Admitting: Psychiatry

## 2019-10-28 DIAGNOSIS — F319 Bipolar disorder, unspecified: Secondary | ICD-10-CM | POA: Diagnosis not present

## 2019-10-28 DIAGNOSIS — G3184 Mild cognitive impairment, so stated: Secondary | ICD-10-CM | POA: Diagnosis not present

## 2019-10-28 DIAGNOSIS — F419 Anxiety disorder, unspecified: Secondary | ICD-10-CM

## 2019-10-28 MED ORDER — CLONAZEPAM 0.5 MG PO TABS: 0.5000 mg | ORAL_TABLET | Freq: Two times a day (BID) | ORAL | 2 refills | Status: DC

## 2019-10-28 MED ORDER — QUETIAPINE FUMARATE 100 MG PO TABS: 100.0000 mg | ORAL_TABLET | Freq: Every day | ORAL | 2 refills | Status: DC

## 2019-10-28 NOTE — Progress Notes (Signed)
Virtual Visit via Telephone Note  I connected with Steven Herrera on 10/28/19 at  1:00 PM EDT by telephone and verified that I am speaking with the correct person using two identifiers.   I discussed the limitations, risks, security and privacy concerns of performing an evaluation and management service by telephone and the availability of in person appointments. I also discussed with the patient that there may be a patient responsible charge related to this service. The patient expressed understanding and agreed to proceed.   History of Present Illness: Patient is evaluated by phone session.  He was last evaluated 6 weeks ago.  At that time he was very nervous and anxious and having panic attack because he was concerned about cognition, tremors, shakes and afraid that he may not continue to work.  He had mild cognitive impairment has history of stroke in the past.  He had extensive neurology work-up.  We started him on low-dose Klonopin and Lamictal was discontinued.  He is showing much improvement in his anxiety and nervousness.  His tremors and shakes are less but he still have trouble with memory.  He has his own business to make tattoos.  So far no major issues or problems working.  His irritability, anger is also under control.  He denies any mania, crying spells or any feeling of hopelessness or suicidal thoughts.  He tried taking Seroquel twice a day but in the morning when he takes it makes him very sleepy so he is taking only at bedtime.  His sleep is good.  His appetite is okay.  He denies any fall or any headaches.  He denies any hallucination or any paranoia.  He feels the current medication is working well and he like to keep the dose same.   Past Psychiatric History:Reviewed. H/[facility]rief[facility] in[facility] age. H/Omood swing, anger, agitation, hyperactivity and running away. [place] acid, LSD, cocaine, marijuana and heavy drinking. Tried Zoloft, Lexapro,melatoninand  Xanax did not like it. Took Paxil and Klonopin and Wellbutrin from PCP. We tried Lamictal.   Psychiatric Specialty Exam: Physical Exam  Review of Systems  There were no vitals taken for this visit.There is no height or weight on file to calculate BMI.  General Appearance: NA  Eye Contact:  NA  Speech:  Clear and Coherent  Volume:  Normal  Mood:  Euthymic  Affect:  NA  Thought Process:  Descriptions of Associations: Intact  Orientation:  Full (Time, Place, and Person)  Thought Content:  Logical  Suicidal Thoughts:  No  Homicidal Thoughts:  No  Memory:  Immediate;   Good Recent;   Fair Remote;   Fair  Judgement:  Intact  Insight:  Present  Psychomotor Activity:  NA  Concentration:  Concentration: Fair and Attention Span: Fair  Recall:  [employer] of Knowledge:  Good  Language:  Good  Akathisia:  mild tremors but better from past  Handed:  Right  AIMS (if indicated):     Assets:  Communication Skills Desire for Improvement Housing Resilience Talents/Skills  ADL's:  Intact  Cognition:  Impaired,  Mild  Sleep:   good      Assessment and Plan: Bipolar disorder type I.  Anxiety.  Mild cognitive impairment.  Patient doing better with a low-dose of Klonopin because helping his anxiety and mood.  However he is taking the Seroquel 100 mg only at bedtime since he tried in the daytime and it made him very sleepy.  I discussed medication side effects  and benefits.  Discussed benzodiazepine dependence tolerance and withdrawal.  I will continue Seroquel 100 mg at bedtime and Klonopin 0.5 mg twice a day.  Recommended to call us back if there is any question or any concerns.  Follow-up in 3 months.  Follow Up Instructions:    I discussed the assessment and treatment plan with the patient. The patient was provided an opportunity to ask questions and all were answered. The patient agreed with the plan and demonstrated an understanding of the instructions.   The patient was advised to  call back or seek an in-person evaluation if the symptoms worsen or if the condition fails to improve as anticipated.  I provided 20 minutes of non-face-to-face time during this encounter.   Kathlee Nations, MD

## 2019-11-17 ENCOUNTER — Telehealth: Payer: Self-pay | Admitting: Physician Assistant

## 2019-11-17 MED ORDER — VITAMIN D (ERGOCALCIFEROL) 1.25 MG (50000 UNIT) PO CAPS: 50000.0000 [IU] | ORAL_CAPSULE | ORAL | 0 refills | Status: DC

## 2019-11-17 MED ORDER — OMEPRAZOLE 20 MG PO CPDR: 20.0000 mg | DELAYED_RELEASE_CAPSULE | Freq: Every day | ORAL | 0 refills | Status: DC

## 2019-11-17 MED FILL — OMEPRAZOLE 20 MG CAP: 20 | 90 days supply | Qty: 90 | Fill #0

## 2019-11-17 MED FILL — VIT D2 1.25 MG (50,000 UNIT: 1.25 MG | 84 days supply | Qty: 12 | Fill #0

## 2019-11-17 NOTE — Addendum Note (Signed)
Addended by: Fonnie Mu on: 11/17/2019 10:31 AM   Modules accepted: Orders

## 2019-11-17 NOTE — Telephone Encounter (Signed)
Patient is requesting a refill of his omeprazole and Vit D med. If approved please send to [facility].

## 2019-11-18 ENCOUNTER — Telehealth: Payer: Self-pay | Admitting: Physician Assistant

## 2019-11-18 NOTE — Telephone Encounter (Signed)
Patient called while office clsd for lunch regarding refill request for Omeprazole Rx---- Called pt back advised see it was sent 5/26 , ask if he had contact pharmacy (no, but will contact them today).  --FYI to med asst.  -glh

## 2019-11-19 MED FILL — clonazePAM 0.5 MG TABS: 0.5 | 30 days supply | Qty: 60 | Fill #0

## 2019-11-19 MED FILL — QUETIAPINE FUMARATE 100 MG: 100 | 30 days supply | Qty: 30 | Fill #0

## 2019-11-26 LAB — CUP PACEART REMOTE DEVICE CHECK
Date Time Interrogation Session: 20210603231947
Implantable Pulse Generator Implant Date: 20190404

## 2019-12-06 ENCOUNTER — Ambulatory Visit: Payer: 59 | Admitting: Neurology

## 2019-12-08 ENCOUNTER — Ambulatory Visit: Payer: 59 | Admitting: Physician Assistant

## 2019-12-08 NOTE — Progress Notes (Deleted)
Established Patient Office Visit  Subjective:  Patient ID: Steven Herrera, male    DOB: 1971/03/01  Age: 49 y.o. MRN: [id number]  CC: No chief complaint on file.   HPI ZECHARIAH BISSONNETTE presents for chronic follow-up on hyperlipidemia, vitamin D deficiency, and elevated blood pressure w/o diagnosis of hypertension.  HLD: Pt taking medication as directed without issues. Denies side effects including myalgias and RUQ pain.   Vitamin D deficiency:  Elevated BP w/o dx of HTN:   Anxiety and depression: Followed by Psychiatry- Dr. Adele Schilder.   Past Medical History:  Diagnosis Date  . Depression   . High cholesterol   . PFO (patent foramen ovale)    a. s/p PFO closure by Dr. Burt Knack on 11/19/17  . Stroke [facility])     Past Surgical History:  Procedure Laterality Date  . big toe amputation Right   . CARPAL TUNNEL RELEASE    . LOOP RECORDER INSERTION N/A 09/25/2017   Procedure: LOOP RECORDER INSERTION;  Surgeon: [name], MD;  Location: [place] [facility];  Service: Cardiovascular;  Laterality: N/A;  . MOUTH SURGERY    . PATENT FORAMEN OVALE(PFO) CLOSURE N/A 11/19/2017   Procedure: PATENT FORAMEN OVALE (PFO) CLOSURE;  Surgeon: [name], MD;  Location: [place] [facility];  Service: Cardiovascular;  Laterality: N/A;  . spider bite    . TEE WITHOUT CARDIOVERSION N/A 09/25/2017   Procedure: TRANSESOPHAGEAL ECHOCARDIOGRAM (TEE) WITH LOOP;  Surgeon: Pixie Casino, MD;  Location: [facility] END[facility]COPY;  Service: Cardiovascular;  Laterality: N/A;  . VASECTOMY      Family History  Problem Relation Age of Onset  . Stroke Father        disabling CVA in early-mid 50's  . Hypertension Father   . Alcohol abuse Father   . Heart disease Mother   . Dementia Mother   . Atrial fibrillation Mother   . Drug abuse Sister   . Hypertension Brother   . Healthy Sister     Social History   Socioeconomic History  . Marital status: Married    Spouse name: Not on file  . Number of children: 2   . Years of education: Not on file  . Highest education level: 8th grade  Occupational History  . Not on file  Tobacco Use  . Smoking status: Former Smoker    Packs/day: 0.25    Years: 28.00    Pack years: 7.00    Types: Cigarettes    Quit date: 07/25/2017    Years since quitting: 2.3  . Smokeless tobacco: Never Used  Vaping Use  . Vaping Use: Former  Substance and Sexual Activity  . Alcohol use: Yes    Alcohol/week: 6.0 standard drinks    Types: 6 Cans of beer per week    Comment: 6 pack nightly  . Drug use: Yes    Types: Marijuana    Comment: takes daily for pain leisure   . Sexual activity: Yes    Birth control/protection: Surgical  Other Topics Concern  . Not on file  Social History Narrative  . Not on file   Social Determinants of Health   Financial Resource Strain:   . Difficulty of Paying Living Expenses:   Food Insecurity:   . Worried About Charity fundraiser in the Last Year:   . Arboriculturist in the Last Year:   Transportation Needs:   . Film/video editor (Medical):   Marland Kitchen Lack of Transportation (Non-Medical):   Physical  Activity:   . Days of Exercise per Week:   . Minutes of Exercise per Session:   Stress:   . Feeling of Stress :   Social Connections:   . Frequency of Communication with Friends and Family:   . Frequency of Social Gatherings with Friends and Family:   . Attends Religious Services:   . Active Member of Clubs or Organizations:   . Attends Archivist Meetings:   Marland Kitchen Marital Status:   Intimate Partner Violence:   . Fear of Current or Ex-Partner:   . Emotionally Abused:   Marland Kitchen Physically Abused:   . Sexually Abused:     Outpatient Medications Prior to Visit  Medication Sig Dispense Refill  . aspirin EC 81 MG tablet Take 81 mg by mouth daily with lunch.     Marland Kitchen atorvastatin (LIPITOR) 40 MG tablet TAKE 1 TABLET BY MOUTH ONCE DAILY AT 6 PM 90 tablet 0  . clonazePAM (KLONOPIN) 0.5 MG tablet Take 1 tablet (0.5 mg total) by mouth 2  (two) times daily. 60 tablet 2  . Menthol, Topical Analgesic, (ICY HOT EX) Apply 1 application topically 3 (three) times daily as needed (pain).     . Multiple Vitamin (MULTIVITAMIN WITH MINERALS) TABS tablet Take 1 tablet by mouth daily with lunch.    . naproxen sodium (ALEVE) 220 MG tablet Take 220-440 mg by mouth 3 (three) times daily as needed (pain/headaches).    Marland Kitchen omega-3 acid ethyl esters (LOVAZA) 1 g capsule TAKE 2 CAPSULES BY MOUTH 2 TIMES DAILY. 240 capsule 0  . omeprazole (PRILOSEC) 20 MG capsule Take 1 capsule (20 mg total) by mouth daily. 90 capsule 0  . QUEtiapine (SEROQUEL) 100 MG tablet Take 1 tablet (100 mg total) by mouth at bedtime. 30 tablet 2  . Vitamin D, Ergocalciferol, (DRISDOL) 1.25 MG (50000 UNIT) CAPS capsule Take 1 capsule (50,000 Units total) by mouth every 7 (seven) days. 16 capsule 0   No facility-administered medications prior to visit.    Allergies  Allergen Reactions  . Ibuprofen Other (See Comments)    Bloody stools  . Penicillins Hives    Has patient had a PCN reaction causing immediate rash, facial/tongue/throat swelling, SOB or lightheadedness with hypotension: No Has patient had a PCN reaction causing severe rash involving mucus membranes or skin necrosis: Yes Has patient had a PCN reaction that required hospitalization: no Has patient had a PCN reaction occurring within the last 10 years: no If all of the above answers are "NO", then may proceed with Cephalosporin use.    ROS Review of Systems  A fourteen system review of systems was performed and found to be positive as per HPI.    Objective:    Physical Exam General:  Well Developed, well nourished, appropriate for stated age.  Neuro:  Alert and oriented,  extra-ocular muscles intact  HEENT:  Normocephalic, atraumatic, neck supple, no carotid bruits appreciated  Skin:  no gross rash, warm, pink. Cardiac:  RRR, S1 S2 Respiratory:  ECTA B/L and A/P, Not using accessory muscles, speaking in  full sentences- unlabored. Vascular:  Ext warm, no cyanosis apprec.; cap RF less 2 sec. Psych:  No HI/SI, judgement and insight good, Euthymic mood. Full Affect.  There were no vitals taken for this visit. Wt Readings from Last 3 Encounters:  08/30/19 210 lb 9.6 oz (95.5 kg)  07/28/19 217 lb 6.4 oz (98.6 kg)  05/24/19 216 lb 12.8 oz (98.3 kg)     Health Maintenance Due  Topic Date Due  . Hepatitis C Screening  Never done  . COVID-19 Vaccine (1) Never done    There are no preventive care reminders to display for this patient.  Lab Results  Component Value Date   TSH 0.653 08/30/2019   Lab Results  Component Value Date   WBC 7.4 07/28/2019   HGB 16.4 07/28/2019   HCT 46.8 07/28/2019   MCV 91 07/28/2019   PLT 257 07/28/2019   Lab Results  Component Value Date   NA 140 07/28/2019   K 4.5 07/28/2019   CO2 22 07/28/2019   GLUCOSE 108 (H) 07/28/2019   BUN 12 07/28/2019   CREATININE 0.93 07/28/2019   BILITOT 0.7 07/28/2019   ALKPHOS 89 07/28/2019   AST 31 07/28/2019   ALT 49 (H) 07/28/2019   PROT 7.3 07/28/2019   ALBUMIN 4.6 07/28/2019   CALCIUM 10.1 07/28/2019   ANIONGAP 11 01/07/2019   Lab Results  Component Value Date   CHOL 141 07/28/2019   Lab Results  Component Value Date   HDL 33 (L) 07/28/2019   Lab Results  Component Value Date   LDLCALC 63 07/28/2019   Lab Results  Component Value Date   TRIG 281 (H) 07/28/2019   Lab Results  Component Value Date   CHOLHDL 4.3 07/28/2019   Lab Results  Component Value Date   HGBA1C 5.4 07/28/2019      Assessment & Plan:   Problem List Items Addressed This Visit    None      No orders of the defined types were placed in this encounter.   Follow-up: No follow-ups on file.    Lorrene Reid, PA-C

## 2019-12-23 MED FILL — QUETIAPINE FUMARATE 100 MG: 100 | 30 days supply | Qty: 30 | Fill #1

## 2019-12-23 MED FILL — clonazePAM 0.5 MG TABS: 0.5 | 30 days supply | Qty: 60 | Fill #1

## 2019-12-30 ENCOUNTER — Ambulatory Visit (INDEPENDENT_AMBULATORY_CARE_PROVIDER_SITE_OTHER): Payer: 59 | Admitting: *Deleted

## 2019-12-30 DIAGNOSIS — I639 Cerebral infarction, unspecified: Secondary | ICD-10-CM | POA: Diagnosis not present

## 2019-12-30 LAB — CUP PACEART REMOTE DEVICE CHECK
Date Time Interrogation Session: 20210707233838
Implantable Pulse Generator Implant Date: 20190404

## 2020-01-03 NOTE — Progress Notes (Signed)
Carelink Summary Report / Loop Recorder 

## 2020-01-25 ENCOUNTER — Other Ambulatory Visit (HOSPITAL_COMMUNITY): Payer: Self-pay | Admitting: Psychiatry

## 2020-01-25 ENCOUNTER — Other Ambulatory Visit: Payer: Self-pay

## 2020-01-25 ENCOUNTER — Telehealth (INDEPENDENT_AMBULATORY_CARE_PROVIDER_SITE_OTHER): Payer: 59 | Admitting: Psychiatry

## 2020-01-25 ENCOUNTER — Encounter (HOSPITAL_COMMUNITY): Payer: Self-pay | Admitting: Psychiatry

## 2020-01-25 DIAGNOSIS — F319 Bipolar disorder, unspecified: Secondary | ICD-10-CM | POA: Diagnosis not present

## 2020-01-25 DIAGNOSIS — G3184 Mild cognitive impairment, so stated: Secondary | ICD-10-CM | POA: Diagnosis not present

## 2020-01-25 DIAGNOSIS — F419 Anxiety disorder, unspecified: Secondary | ICD-10-CM | POA: Diagnosis not present

## 2020-01-25 MED ORDER — CLONAZEPAM 0.5 MG PO TABS: 0.5000 mg | ORAL_TABLET | Freq: Two times a day (BID) | ORAL | 5 refills | Status: DC

## 2020-01-25 MED ORDER — QUETIAPINE FUMARATE 100 MG PO TABS: 100.0000 mg | ORAL_TABLET | Freq: Every day | ORAL | 5 refills | Status: DC

## 2020-01-25 MED ORDER — CLONAZEPAM 0.5 MG PO TABS: 0.5000 mg | ORAL_TABLET | Freq: Two times a day (BID) | ORAL | 2 refills | Status: DC

## 2020-01-25 MED ORDER — QUETIAPINE FUMARATE 100 MG PO TABS: 100.0000 mg | ORAL_TABLET | Freq: Every day | ORAL | 2 refills | Status: DC

## 2020-01-25 MED FILL — QUETIAPINE FUMARATE 100 MG: 100 | 30 days supply | Qty: 30 | Fill #0

## 2020-01-25 MED FILL — clonazePAM 0.5 MG TABS: 0.5 | 30 days supply | Qty: 60 | Fill #0

## 2020-01-25 NOTE — Progress Notes (Signed)
Virtual Visit via Telephone Note  I connected with Steven Herrera on 01/25/20 at  1:20 PM EDT by telephone and verified that I am speaking with the correct person using two identifiers.  Location: Patient: work Provider: home office   I discussed the limitations, risks, security and privacy concerns of performing an evaluation and management service by telephone and the availability of in person appointments. I also discussed with the patient that there may be a patient responsible charge related to this service. The patient expressed understanding and agreed to proceed.   History of Present Illness: Patient is evaluated by phone session.  He is doing okay on Seroquel and Klonopin.  Though he is still have shakes and mild memory impairment but he realizes underlying condition and so far he does not have any major anxiety or nervousness.  He feels his anxiety and depression is a stable on Seroquel and Klonopin.  Sleeps most of the nights good.  Denies any anger, mood swings, mania or any psychosis.  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  He has tremors but it is stable.  He continues to smoke marijuana and occasional beer but denies any intoxication, withdrawal symptoms.  He had a good support from his family.  He feels the current medicine is working and he like to keep it.  His appetite is okay.   Past Psychiatric History: H/Obriefly seeing therapist at [facility] inyoung age. H/Omood swing, anger, agitation, hyperactivity and running away. [place] acid, LSD, cocaine, marijuana and heavy drinking. Tried Zoloft, Lexapro,melatoninand Xanax did not like it. Took Paxil and Klonopin and Wellbutrin from PCP.We tried Lamictal.  Psychiatric Specialty Exam: Physical Exam  Review of Systems  There were no vitals taken for this visit.There is no height or weight on file to calculate BMI.  General Appearance: NA  Eye Contact:  NA  Speech:   Clear and Coherent and Slow  Volume:  Normal  Mood:  Euthymic  Affect:  NA  Thought Process:  Descriptions of Associations: Intact  Orientation:  Full (Time, Place, and Person)  Thought Content:  Rumination  Suicidal Thoughts:  No  Homicidal Thoughts:  No  Memory:  Immediate;   Fair Recent;   Fair Remote;   Fair  Judgement:  Intact  Insight:  Present  Psychomotor Activity:  NA  Concentration:  Concentration: Fair and Attention Span: Fair  Recall:  [employer] of Knowledge:  Good  Language:  Good  Akathisia:  mild trmors  Handed:  Right  AIMS (if indicated):     Assets:  Communication Skills Desire for Improvement Housing Resilience Social Support Talents/Skills  ADL's:  Intact  Cognition:  Impaired,  Mild  Sleep:   fair      Assessment and Plan: Bipolar disorder type I.  Anxiety.  Mild cognitive impairment.  Patient is stable on low-dose Klonopin and Seroquel.  He does not want to change medication since he feels it is helping his mood and anxiety.  Continue Seroquel 100 mg at bedtime and Klonopin 0.5 mg twice a day as needed.  Recommended to call us back if is any question or any concern.  He like to follow-up in 6 months.  Follow Up Instructions:    I discussed the assessment and treatment plan with the patient. The patient was provided an opportunity to ask questions and all were answered. The patient agreed with the plan and demonstrated an understanding of the instructions.  The patient was advised to call back or seek an in-person evaluation if the symptoms worsen or if the condition fails to improve as anticipated.  I provided 15 minutes of non-face-to-face time during this encounter.   Kathlee Nations, MD

## 2020-01-28 ENCOUNTER — Telehealth: Payer: Self-pay

## 2020-01-28 ENCOUNTER — Other Ambulatory Visit: Payer: Self-pay | Admitting: Physician Assistant

## 2020-01-28 MED FILL — VIT D2 1.25 MG (50,000 UNIT: 1.25 MG | 27 days supply | Qty: 4 | Fill #1

## 2020-01-28 MED FILL — ATORVASTATIN CALCIUM 40 MG: 40 | 60 days supply | Qty: 60 | Fill #0

## 2020-01-28 NOTE — Telephone Encounter (Signed)
Please call pt to schedule appt.  No further refills until pt is seen.  T. Lajune Perine, CMA  

## 2020-02-01 ENCOUNTER — Ambulatory Visit (INDEPENDENT_AMBULATORY_CARE_PROVIDER_SITE_OTHER): Payer: 59 | Admitting: *Deleted

## 2020-02-01 DIAGNOSIS — I639 Cerebral infarction, unspecified: Secondary | ICD-10-CM | POA: Diagnosis not present

## 2020-02-01 LAB — CUP PACEART REMOTE DEVICE CHECK
Date Time Interrogation Session: 20210809233809
Implantable Pulse Generator Implant Date: 20190404

## 2020-02-03 NOTE — Progress Notes (Signed)
Carelink Summary Report / Loop Recorder 

## 2020-02-14 ENCOUNTER — Other Ambulatory Visit: Payer: Self-pay | Admitting: Physician Assistant

## 2020-02-15 MED FILL — OMEPRAZOLE 20 MG CAP: 20 | 60 days supply | Qty: 60 | Fill #0

## 2020-03-01 DIAGNOSIS — H5213 Myopia, bilateral: Secondary | ICD-10-CM | POA: Diagnosis not present

## 2020-03-02 ENCOUNTER — Other Ambulatory Visit: Payer: Self-pay | Admitting: Physician Assistant

## 2020-03-02 MED FILL — QUETIAPINE FUMARATE 100 MG: 100 | 30 days supply | Qty: 30 | Fill #1

## 2020-03-02 MED FILL — VIT D2 1.25 MG (50,000 UNIT: 1.25 MG | 28 days supply | Qty: 4 | Fill #0

## 2020-03-21 ENCOUNTER — Other Ambulatory Visit: Payer: Self-pay | Admitting: Physician Assistant

## 2020-03-21 ENCOUNTER — Telehealth: Payer: Self-pay | Admitting: Physician Assistant

## 2020-03-21 MED FILL — ATORVASTATIN 40 MG TABLET: 40 | 30 days supply | Qty: 30 | Fill #0

## 2020-03-21 MED FILL — clonazePAM 0.5 MG TABS: 0.5 | 30 days supply | Qty: 60 | Fill #1

## 2020-03-21 NOTE — Telephone Encounter (Signed)
Please call patient to schedule apt for any further refills. AS< CMA

## 2020-03-31 MED FILL — QUETIAPINE FUMARATE 100 MG: 100 | 30 days supply | Qty: 30 | Fill #2

## 2020-04-24 ENCOUNTER — Telehealth: Payer: Self-pay | Admitting: Physician Assistant

## 2020-04-24 ENCOUNTER — Other Ambulatory Visit: Payer: Self-pay | Admitting: Physician Assistant

## 2020-04-24 MED FILL — OMEPRAZOLE 20 MG CAP: 20 | 30 days supply | Qty: 30 | Fill #0

## 2020-04-24 MED FILL — ATORVASTATIN 40 MG TABLET: 40 | 15 days supply | Qty: 15 | Fill #0

## 2020-04-24 NOTE — Telephone Encounter (Signed)
Please call patient to schedule OV apt for further refills. AS, CMA

## 2020-05-08 ENCOUNTER — Other Ambulatory Visit: Payer: Self-pay | Admitting: Physician Assistant

## 2020-05-08 ENCOUNTER — Other Ambulatory Visit: Payer: Self-pay

## 2020-05-08 ENCOUNTER — Ambulatory Visit (INDEPENDENT_AMBULATORY_CARE_PROVIDER_SITE_OTHER): Payer: 59 | Admitting: Physician Assistant

## 2020-05-08 ENCOUNTER — Encounter: Payer: Self-pay | Admitting: Physician Assistant

## 2020-05-08 VITALS — BP 107/71 | HR 76 | Temp 98.2°F | Ht 72.0 in | Wt 185.3 lb

## 2020-05-08 DIAGNOSIS — E559 Vitamin D deficiency, unspecified: Secondary | ICD-10-CM | POA: Diagnosis not present

## 2020-05-08 DIAGNOSIS — Z6825 Body mass index (BMI) 25.0-25.9, adult: Secondary | ICD-10-CM

## 2020-05-08 DIAGNOSIS — K219 Gastro-esophageal reflux disease without esophagitis: Secondary | ICD-10-CM

## 2020-05-08 DIAGNOSIS — E663 Overweight: Secondary | ICD-10-CM | POA: Diagnosis not present

## 2020-05-08 DIAGNOSIS — Z0000 Encounter for general adult medical examination without abnormal findings: Secondary | ICD-10-CM

## 2020-05-08 DIAGNOSIS — Z79899 Other long term (current) drug therapy: Secondary | ICD-10-CM

## 2020-05-08 DIAGNOSIS — E7800 Pure hypercholesterolemia, unspecified: Secondary | ICD-10-CM

## 2020-05-08 DIAGNOSIS — E781 Pure hyperglyceridemia: Secondary | ICD-10-CM | POA: Diagnosis not present

## 2020-05-08 DIAGNOSIS — F418 Other specified anxiety disorders: Secondary | ICD-10-CM | POA: Diagnosis not present

## 2020-05-08 MED ORDER — OMEPRAZOLE 20 MG PO CPDR: DELAYED_RELEASE_CAPSULE | ORAL | 1 refills | Status: DC

## 2020-05-08 MED ORDER — ATORVASTATIN CALCIUM 40 MG PO TABS: ORAL_TABLET | ORAL | 1 refills | Status: DC

## 2020-05-08 MED FILL — clonazePAM 0.5 MG TABS: 0.5 | 30 days supply | Qty: 60 | Fill #2

## 2020-05-08 MED FILL — QUETIAPINE FUMARATE 100 MG: 100 | 30 days supply | Qty: 30 | Fill #3

## 2020-05-08 NOTE — Assessment & Plan Note (Signed)
-  Followed by Psychiatry.  PHQ-9 score of 2 -Continue current medication regimen.

## 2020-05-08 NOTE — Patient Instructions (Signed)

## 2020-05-08 NOTE — Progress Notes (Signed)
Established Patient Office Visit  Subjective:  Patient ID: Steven Herrera, male    DOB: 1971-05-04  Age: 49 y.o. MRN: [id number]  CC:  Chief Complaint  Patient presents with   Hyperlipidemia   Vitamin D deficiency    HPI Steven Herrera presents for follow up on hypertriglyceridemia and vitamin D deficiency. Has no acute concerns today.  Hypertriglyceridemia: Patient reports compliance with Lipitor but sometimes forgets to take lovaza.  States has made dietary changes by increasing fruits, salads and reduced alcohol and fatty foods.  Has eliminated sodas and drinks water throughout the day.  Is more physically active.  Vitamin D deficiency: Has been out of medication for a couple of weeks.  GERD: Reports symptoms are under good control with omeprazole. Denies exacerbations/flare-ups.   Past Medical History:  Diagnosis Date   Depression    High cholesterol    PFO (patent foramen ovale)    a. s/p PFO closure by Dr. Burt Knack on 11/19/17   Stroke [facility])     Past Surgical History:  Procedure Laterality Date   big toe amputation Right    CARPAL TUNNEL RELEASE     LOOP RECORDER INSERTION N/A 09/25/2017   Procedure: LOOP RECORDER INSERTION;  Surgeon: [name], MD;  Location: [facility];  Service: Cardiovascular;  Laterality: N/A;   MOUTH SURGERY     PATENT FORAMEN OVALE(PFO) CLOSURE N/A 11/19/2017   Procedure: PATENT FORAMEN OVALE (PFO) CLOSURE;  Surgeon: [name], MD;  Location: [facility];  Service: Cardiovascular;  Laterality: N/A;   spider bite     TEE WITHOUT CARDIOVERSION N/A 09/25/2017   Procedure: TRANSESOPHAGEAL ECHOCARDIOGRAM (TEE) WITH LOOP;  Surgeon: Pixie Casino, MD;  Location: [facility];  Service: Cardiovascular;  Laterality: N/A;   VASECTOMY      Family History  Problem Relation Age of Onset   Stroke Father        disabling CVA in early-mid 61's   Hypertension Father    Alcohol abuse Father    Heart  disease Mother    Dementia Mother    Atrial fibrillation Mother    Drug abuse Sister    Hypertension Brother    Healthy Sister     Social History   Socioeconomic History   Marital status: Married    Spouse name: Not on file   Number of children: 2   Years of education: Not on file   Highest education level: 8th grade  Occupational History   Not on file  Tobacco Use   Smoking status: Former Smoker    Packs/day: 0.25    Years: 28.00    Pack years: 7.00    Types: Cigarettes    Quit date: 07/25/2017    Years since quitting: 2.7   Smokeless tobacco: Never Used  Vaping Use   Vaping Use: Former  Substance and Sexual Activity   Alcohol use: Yes    Alcohol/week: 6.0 standard drinks    Types: 6 Cans of beer per week    Comment: 6 pack nightly   Drug use: Yes    Types: Marijuana    Comment: takes daily for pain leisure    Sexual activity: Yes    Birth control/protection: Surgical  Other Topics Concern   Not on file  Social History Narrative   Not on file   Social Determinants of Health   Financial Resource Strain:    Difficulty of Paying Living Expenses: Not on file  Food Insecurity:  Worried About Charity fundraiser in the Last Year: Not on file   [employer] of Food in the Last Year: Not on file  Transportation Needs:    Lack of Transportation (Medical): Not on file   Lack of Transportation (Non-Medical): Not on file  Physical Activity:    Days of Exercise per Week: Not on file   Minutes of Exercise per Session: Not on file  Stress:    Feeling of Stress : Not on file  Social Connections:    Frequency of Communication with Friends and Family: Not on file   Frequency of Social Gatherings with Friends and Family: Not on file   Attends Religious Services: Not on file   Active Member of Clubs or Organizations: Not on file   Attends Archivist Meetings: Not on file   Marital Status: Not on file  Intimate Partner Violence:     Fear of Current or Ex-Partner: Not on file   Emotionally Abused: Not on file   Physically Abused: Not on file   Sexually Abused: Not on file    Outpatient Medications Prior to Visit  Medication Sig Dispense Refill   aspirin EC 81 MG tablet Take 81 mg by mouth daily with lunch.      clonazePAM (KLONOPIN) 0.5 MG tablet Take 1 tablet (0.5 mg total) by mouth 2 (two) times daily. 60 tablet 5   Menthol, Topical Analgesic, (ICY HOT EX) Apply 1 application topically 3 (three) times daily as needed (pain).      Multiple Vitamin (MULTIVITAMIN WITH MINERALS) TABS tablet Take 1 tablet by mouth daily with lunch.     naproxen sodium (ALEVE) 220 MG tablet Take 220-440 mg by mouth 3 (three) times daily as needed (pain/headaches).     omega-3 acid ethyl esters (LOVAZA) 1 g capsule TAKE 2 CAPSULES BY MOUTH 2 TIMES DAILY. 240 capsule 0   QUEtiapine (SEROQUEL) 100 MG tablet Take 1 tablet (100 mg total) by mouth at bedtime. 30 tablet 5   Vitamin D, Ergocalciferol, (DRISDOL) 1.25 MG (50000 UNIT) CAPS capsule Take 1 capsule (50,000 Units total) by mouth every 7 (seven) days. **NEEDS APT FOR FURTHER REFILLS** 4 capsule 0   atorvastatin (LIPITOR) 40 MG tablet TAKE 1 TABLET BY MOUTH ONCE DAILY AT 6PM. OFFICE IVIST REQUIRED PRIOR TO FURTHER REFILLS. 15 tablet 0   omeprazole (PRILOSEC) 20 MG capsule TAKE 1 CAPSULE BY MOUTH DAILY. **PATIENT NEEDS APT FOR FURTHER REFILLS** 30 capsule 0   No facility-administered medications prior to visit.    Allergies  Allergen Reactions   Ibuprofen Other (See Comments)    Bloody stools   Penicillins Hives    Has patient had a PCN reaction causing immediate rash, facial/tongue/throat swelling, SOB or lightheadedness with hypotension: No Has patient had a PCN reaction causing severe rash involving mucus membranes or skin necrosis: Yes Has patient had a PCN reaction that required hospitalization: no Has patient had a PCN reaction occurring within the last 10 years:  no If all of the above answers are "NO", then may proceed with Cephalosporin use.    ROS Review of Systems A fourteen system review of systems was performed and found to be positive as per HPI.  Objective:    Physical Exam General:  Well Developed, well nourished, appropriate for stated age.  Neuro:  Alert and oriented,  extra-ocular muscles intact  HEENT:  Normocephalic, atraumatic, neck supple Skin:  no gross rash, warm, pink. Cardiac:  RRR, S1 S2 Respiratory:  ECTA B/L  and A/P, Not using accessory muscles, speaking in full sentences- unlabored.  Vascular:  Ext warm, no cyanosis apprec.; cap RF less 2 sec. Psych:  No HI/SI, judgement and insight good, Euthymic mood. Full Affect.   BP 107/71    Pulse 76    Temp 98.2 F (36.8 C) (Oral)    Ht 6' (1.829 m)    Wt 185 lb 4.8 oz (84.1 kg)    SpO2 98% Comment: on RA   BMI 25.13 kg/m  Wt Readings from Last 3 Encounters:  05/08/20 185 lb 4.8 oz (84.1 kg)  08/30/19 210 lb 9.6 oz (95.5 kg)  07/28/19 217 lb 6.4 oz (98.6 kg)     Health Maintenance Due  Topic Date Due   Hepatitis C Screening  Never done   COVID-19 Vaccine (1) Never done   INFLUENZA VACCINE  Never done    There are no preventive care reminders to display for this patient.  Lab Results  Component Value Date   TSH 0.653 08/30/2019   Lab Results  Component Value Date   WBC 7.4 07/28/2019   HGB 16.4 07/28/2019   HCT 46.8 07/28/2019   MCV 91 07/28/2019   PLT 257 07/28/2019   Lab Results  Component Value Date   NA 140 07/28/2019   K 4.5 07/28/2019   CO2 22 07/28/2019   GLUCOSE 108 (H) 07/28/2019   BUN 12 07/28/2019   CREATININE 0.93 07/28/2019   BILITOT 0.7 07/28/2019   ALKPHOS 89 07/28/2019   AST 31 07/28/2019   ALT 49 (H) 07/28/2019   PROT 7.3 07/28/2019   ALBUMIN 4.6 07/28/2019   CALCIUM 10.1 07/28/2019   ANIONGAP 11 01/07/2019   Lab Results  Component Value Date   CHOL 141 07/28/2019   Lab Results  Component Value Date   HDL 33 (L)  07/28/2019   Lab Results  Component Value Date   LDLCALC 63 07/28/2019   Lab Results  Component Value Date   TRIG 281 (H) 07/28/2019   Lab Results  Component Value Date   CHOLHDL 4.3 07/28/2019   Lab Results  Component Value Date   HGBA1C 5.4 07/28/2019      Assessment & Plan:   Problem List Items Addressed This Visit      Other   High cholesterol (Chronic)   Relevant Medications   atorvastatin (LIPITOR) 40 MG tablet   Depression with anxiety    -Followed by Psychiatry.  PHQ-9 score of 2 -Continue current medication regimen.        Healthcare maintenance   Relevant Orders   Lipid Profile   Comp Met (CMET)   CBC w/Diff   Vitamin D (25 hydroxy)   On statin therapy   Relevant Orders   Comp Met (CMET)   Vitamin D deficiency    -Last vitamin D low -Will recheck vitamin D today.  Pending results will send additional refills of vitamin D 50,000 units once weekly.      Relevant Orders   Vitamin D (25 hydroxy)   Hypertriglyceridemia - Primary   Relevant Medications   atorvastatin (LIPITOR) 40 MG tablet   Other Relevant Orders   Lipid Profile   Comp Met (CMET)    Other Visit Diagnoses    Overweight with body mass index (BMI) of 25 to 25.9 in adult       Gastroesophageal reflux disease, unspecified whether esophagitis present       Relevant Medications   omeprazole (PRILOSEC) 20 MG capsule  High cholesterol, hypertriglyceridemia, On statin therapy: -Last lipid panel: Total cholesterol 141, triglycerides 281, HDL 33, LDL 63 -Continue current medication regimen and recommend to improve medication adherence with lovaza. -Continue with dietary changes and follow a heart healthy diet low in saturated and transfats. -Continue to stay active. -Patient is fasting so will repeat lipid panel and hepatic function.  Overweight with body mass indext (BMI) of 25 to 25.9 in adult: -Patient has lost approximately 34 pounds since last visit. Encourage to continue  with dietary change and physical activity.   GERD: -Well controlled. Continue current medication regimen. -Recommend to avoid provocative foods.  Healthcare Maintenance: -Patient is fasting so will collect FBW. -Patient declined influenza vaccine. Doesn't get flu vaccine. -Schedule CPE in 4 months.   Meds ordered this encounter  Medications   atorvastatin (LIPITOR) 40 MG tablet    Sig: TAKE 1 TABLET BY MOUTH ONCE DAILY AT 6PM.    Dispense:  90 tablet    Refill:  1    Order Specific Question:   Supervising Provider    Answer:   Beatrice Lecher D [[id number]]   omeprazole (PRILOSEC) 20 MG capsule    Sig: TAKE 1 CAPSULE BY MOUTH DAILY.    Dispense:  90 capsule    Refill:  1    Order Specific Question:   Supervising Provider    Answer:   Beatrice Lecher D [[id number]]    Follow-up: Return in about 4 months (around 09/05/2020) for CPE .   Note:  This note was prepared with assistance of Dragon voice recognition software. Occasional wrong-word or sound-a-like substitutions may have occurred due to the inherent limitations of voice recognition software.  Lorrene Reid, PA-C

## 2020-05-08 NOTE — Assessment & Plan Note (Signed)
-  Last vitamin D low -Will recheck vitamin D today.  Pending results will send additional refills of vitamin D 50,000 units once weekly.

## 2020-05-09 ENCOUNTER — Other Ambulatory Visit: Payer: Self-pay | Admitting: Physician Assistant

## 2020-05-09 DIAGNOSIS — E559 Vitamin D deficiency, unspecified: Secondary | ICD-10-CM

## 2020-05-09 LAB — LIPID PANEL
Chol/HDL Ratio: 4.7 ratio (ref 0.0–5.0)
Cholesterol, Total: 132 mg/dL (ref 100–199)
HDL: 28 mg/dL — ABNORMAL LOW (ref >39)
LDL Chol Calc (NIH): 83 mg/dL (ref 0–99)
Triglycerides: 111 mg/dL (ref 0–149)
VLDL Cholesterol Cal: 21 mg/dL (ref 5–40)

## 2020-05-09 LAB — COMPREHENSIVE METABOLIC PANEL
ALT: 27 IU/L (ref 0–44)
AST: 26 IU/L (ref 0–40)
Albumin/Globulin Ratio: 1.9 (ref 1.2–2.2)
Albumin: 4.5 g/dL (ref 4.0–5.0)
Alkaline Phosphatase: 74 IU/L (ref 44–121)
BUN/Creatinine Ratio: 13 (ref 9–20)
BUN: 11 mg/dL (ref 6–24)
Bilirubin Total: 0.3 mg/dL (ref 0.0–1.2)
CO2: 25 mmol/L (ref 20–29)
Calcium: 9.7 mg/dL (ref 8.7–10.2)
Chloride: 103 mmol/L (ref 96–106)
Creatinine, Ser: 0.87 mg/dL (ref 0.76–1.27)
GFR calc Af Amer: 119 mL/min/{1.73_m2} (ref >59)
GFR calc non Af Amer: 103 mL/min/{1.73_m2} (ref >59)
Globulin, Total: 2.4 g/dL (ref 1.5–4.5)
Glucose: 98 mg/dL (ref 65–99)
Potassium: 4.6 mmol/L (ref 3.5–5.2)
Sodium: 139 mmol/L (ref 134–144)
Total Protein: 6.9 g/dL (ref 6.0–8.5)

## 2020-05-09 LAB — CBC WITH DIFFERENTIAL/PLATELET
Basophils Absolute: 0.1 10*3/uL (ref 0.0–0.2)
Basos: 1 %
EOS (ABSOLUTE): 0.1 10*3/uL (ref 0.0–0.4)
Eos: 2 %
Hematocrit: 48.3 % (ref 37.5–51.0)
Hemoglobin: 16.3 g/dL (ref 13.0–17.7)
Immature Grans (Abs): 0 10*3/uL (ref 0.0–0.1)
Immature Granulocytes: 0 %
Lymphocytes Absolute: 1.6 10*3/uL (ref 0.7–3.1)
Lymphs: 25 %
MCH: 31.3 pg (ref 26.6–33.0)
MCHC: 33.7 g/dL (ref 31.5–35.7)
MCV: 93 fL (ref 79–97)
Monocytes Absolute: 0.8 10*3/uL (ref 0.1–0.9)
Monocytes: 12 %
Neutrophils Absolute: 4 10*3/uL (ref 1.4–7.0)
Neutrophils: 60 %
Platelets: 249 10*3/uL (ref 150–450)
RBC: 5.21 x10E6/uL (ref 4.14–5.80)
RDW: 12.3 % (ref 11.6–15.4)
WBC: 6.6 10*3/uL (ref 3.4–10.8)

## 2020-05-09 LAB — VITAMIN D 25 HYDROXY (VIT D DEFICIENCY, FRACTURES): Vit D, 25-Hydroxy: 51.6 ng/mL (ref 30.0–100.0)

## 2020-05-09 MED ORDER — VITAMIN D (ERGOCALCIFEROL) 1.25 MG (50000 UNIT) PO CAPS: 50000.0000 [IU] | ORAL_CAPSULE | ORAL | 0 refills | Status: DC

## 2020-05-09 MED FILL — VIT D2 1.25 MG (50,000 UNIT: 1.25 MG | 84 days supply | Qty: 12 | Fill #0

## 2020-05-15 MED FILL — ATORVASTATIN 40 MG TABLET: 40 | 90 days supply | Qty: 90 | Fill #0

## 2020-05-18 MED FILL — OMEPRAZOLE 20 MG CAP: 20 | 90 days supply | Qty: 90 | Fill #0

## 2020-06-13 MED FILL — clonazePAM 0.5 MG TABS: 0.5 | 30 days supply | Qty: 60 | Fill #3

## 2020-06-13 MED FILL — QUETIAPINE FUMARATE 100 MG: 100 | 30 days supply | Qty: 30 | Fill #4

## 2020-07-17 MED FILL — QUETIAPINE FUMARATE 100 MG: 100 | 30 days supply | Qty: 30 | Fill #5

## 2020-07-17 MED FILL — clonazePAM 0.5 MG TABS: 0.5 | 30 days supply | Qty: 60 | Fill #4

## 2020-07-27 ENCOUNTER — Other Ambulatory Visit: Payer: Self-pay

## 2020-07-27 ENCOUNTER — Other Ambulatory Visit (HOSPITAL_COMMUNITY): Payer: Self-pay | Admitting: Psychiatry

## 2020-07-27 ENCOUNTER — Encounter (HOSPITAL_COMMUNITY): Payer: Self-pay | Admitting: Psychiatry

## 2020-07-27 ENCOUNTER — Telehealth (INDEPENDENT_AMBULATORY_CARE_PROVIDER_SITE_OTHER): Payer: 59 | Admitting: Psychiatry

## 2020-07-27 DIAGNOSIS — F319 Bipolar disorder, unspecified: Secondary | ICD-10-CM | POA: Diagnosis not present

## 2020-07-27 DIAGNOSIS — F419 Anxiety disorder, unspecified: Secondary | ICD-10-CM

## 2020-07-27 MED ORDER — CLONAZEPAM 0.5 MG PO TABS: 0.5000 mg | ORAL_TABLET | Freq: Two times a day (BID) | ORAL | 5 refills | Status: DC

## 2020-07-27 MED ORDER — QUETIAPINE FUMARATE 100 MG PO TABS: 100.0000 mg | ORAL_TABLET | Freq: Every day | ORAL | 5 refills | Status: DC

## 2020-07-27 NOTE — Progress Notes (Signed)
Virtual Visit via Telephone Note  I connected with Steven Herrera on 07/27/20 at  1:00 PM EST by telephone and verified that I am speaking with the correct person using two identifiers.  Location: Patient: Home Provider: Home Office   I discussed the limitations, risks, security and privacy concerns of performing an evaluation and management service by telephone and the availability of in person appointments. I also discussed with the patient that there may be a patient responsible charge related to this service. The patient expressed understanding and agreed to proceed.   History of Present Illness: Patient is evaluated by phone session.  He is on the phone by himself.  He has been doing well on Seroquel and Klonopin.  Patient moved to his new home in summer last year and since he has been working on graduation.  He has been more active.  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  He was busy but now he is slow time.  Recently he had blood work and his triglycerides are improved.  His vitamin D level is also improved.  He continues to have memory issues but no major changes.  He feels his current medicine is working and he denies any mood swings, anger, mania or any psychosis.  He sleeps good.  He takes Klonopin which is helping his anxiety.  Sometimes he has mild tremors but they are stable.  He continues to smoke marijuana sometimes but denies any recent increase intake.  His weight is stable.  He had a good support from his family members.  Likes to keep his current medication.   Past Psychiatric History: H/Obriefly seeing therapist at [facility] inyoung age. H/Omood swing, anger, agitation, hyperactivity and running away. [place] acid, LSD, cocaine, marijuana and heavy drinking. Tried Zoloft, Lexapro,melatoninand Xanax did not like it. Took Paxil and Klonopin and Wellbutrin from PCP.We tried Lamictal.  Recent Results (from the past 2160 hour(s))  Lipid Profile     Status: Abnormal   Collection  Time: 05/08/20  9:47 AM  Result Value Ref Range   Cholesterol, Total 132 100 - 199 mg/dL   Triglycerides 111 0 - 149 mg/dL   HDL 28 (L) >39 mg/dL   VLDL Cholesterol Cal 21 5 - 40 mg/dL   LDL Chol Calc (NIH) 83 0 - 99 mg/dL   Chol/HDL Ratio 4.7 0.0 - 5.0 ratio    Comment:                                   T. Chol/HDL Ratio                                             Men  Women                               1/2 Avg.Risk  3.4    3.3                                   Avg.Risk  5.0    4.4  2X Avg.Risk  9.6    7.1                                3X Avg.Risk 23.4   11.0   Comp Met (CMET)     Status: None   Collection Time: 05/08/20  9:47 AM  Result Value Ref Range   Glucose 98 65 - 99 mg/dL   BUN 11 6 - 24 mg/dL   Creatinine, Ser 0.87 0.76 - 1.27 mg/dL   GFR calc non Af Amer 103 >59 mL/min/1.73   GFR calc Af Amer 119 >59 mL/min/1.73    Comment: **In accordance with recommendations from the NKF-ASN Task force,**   [facility] is in the process of updating its eGFR calculation to the   2021 CKD-EPI creatinine equation that estimates kidney function   without a race variable.    BUN/Creatinine Ratio 13 9 - 20   Sodium 139 134 - 144 mmol/L   Potassium 4.6 3.5 - 5.2 mmol/L   Chloride 103 96 - 106 mmol/L   CO2 25 20 - 29 mmol/L   Calcium 9.7 8.7 - 10.2 mg/dL   Total Protein 6.9 6.0 - 8.5 g/dL   Albumin 4.5 4.0 - 5.0 g/dL   Globulin, Total 2.4 1.5 - 4.5 g/dL   Albumin/Globulin Ratio 1.9 1.2 - 2.2   Bilirubin Total 0.3 0.0 - 1.2 mg/dL   Alkaline Phosphatase 74 44 - 121 IU/L    Comment:               **Please note reference interval change**   AST 26 0 - 40 IU/L   ALT 27 0 - 44 IU/L  CBC w/Diff     Status: None   Collection Time: 05/08/20  9:47 AM  Result Value Ref Range   WBC 6.6 3.4 - 10.8 x10E3/uL   RBC 5.21 4.14 - 5.80 x10E6/uL   Hemoglobin 16.3 13.0 - 17.7 g/dL   Hematocrit 48.3 37.5 - 51.0 %   MCV 93 79 - 97 fL   MCH 31.3 26.6 - 33.0 pg   MCHC 33.7  31.5 - 35.7 g/dL   RDW 12.3 11.6 - 15.4 %   Platelets 249 150 - 450 x10E3/uL   Neutrophils 60 Not Estab. %   Lymphs 25 Not Estab. %   Monocytes 12 Not Estab. %   Eos 2 Not Estab. %   Basos 1 Not Estab. %   Neutrophils Absolute 4.0 1.4 - 7.0 x10E3/uL   Lymphocytes Absolute 1.6 0.7 - 3.1 x10E3/uL   Monocytes Absolute 0.8 0.1 - 0.9 x10E3/uL   EOS (ABSOLUTE) 0.1 0.0 - 0.4 x10E3/uL   Basophils Absolute 0.1 0.0 - 0.2 x10E3/uL   Immature Granulocytes 0 Not Estab. %   Immature Grans (Abs) 0.0 0.0 - 0.1 x10E3/uL  Vitamin D (25 hydroxy)     Status: None   Collection Time: 05/08/20  9:47 AM  Result Value Ref Range   Vit D, 25-Hydroxy 51.6 30.0 - 100.0 ng/mL    Comment: Vitamin D deficiency has been defined by the Institute of Medicine and an Endocrine Society practice guideline as a level of serum 25-OH vitamin D less than 20 ng/mL (1,2). The Endocrine Society went on to further define vitamin D insufficiency as a level between 21 and 29 ng/mL (2). 1. IOM (Institute of Medicine). 2010. Dietary reference    intakes for calcium and D. [place]: The    [employer]. 2. Holick MF, [place], Bischoff-Ferrari HA, et al.    Evaluation, treatment, and prevention of vitamin D    deficiency: an Endocrine Society clinical practice    guideline. JCEM. 2011 Jul; 96(7):1911-30.       Psychiatric Specialty Exam: Physical Exam  Review of Systems  Weight 185 lb (83.9 kg).There is no height or weight on file to calculate BMI.  General Appearance: NA  Eye Contact:  NA  Speech:  Normal Rate  Volume:  Normal  Mood:  Euthymic  Affect:  NA  Thought Process:  Goal Directed  Orientation:  Full (Time, Place, and Person)  Thought Content:  Logical  Suicidal Thoughts:  No  Homicidal Thoughts:  No  Memory:  Immediate;   Fair Recent;   Fair Remote;   Fair  Judgement:  Intact  Insight:  Present  Psychomotor Activity:  NA  Concentration:  Concentration: Fair and Attention Span: Fair   Recall:  [employer] of Knowledge:  Good  Language:  Good  Akathisia:  No  Handed:  Right  AIMS (if indicated):     Assets:  Communication Skills Desire for Improvement Housing Social Support  ADL's:  Intact  Cognition:  WNL  Sleep:   ok      Assessment and Plan: Bipolar disorder type I.  Anxiety.  Mild cognitive impairment.  Patient doing well on his current dose of medication.  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  Since he moved last year to his new home he is been busy and awaiting.  He does not want to change the medication.  I reviewed blood work results.  Continue Klonopin 0.5 mg twice a day and Seroquel 100 mg at bedtime.  He has done a lot of work-up for his mild memory impairment and seen by consultant but no further intervention recommended by consultant.  Discussed medication side effects and benefits.  Follow-up in 3 months.    Follow Up Instructions:    I discussed the assessment and treatment plan with the patient. The patient was provided an opportunity to ask questions and all were answered. The patient agreed with the plan and demonstrated an understanding of the instructions.   The patient was advised to call back or seek an in-person evaluation if the symptoms worsen or if the condition fails to improve as anticipated.  I provided 17 minutes of non-face-to-face time during this encounter.   Kathlee Nations, MD

## 2020-08-21 ENCOUNTER — Other Ambulatory Visit: Payer: Self-pay | Admitting: Physician Assistant

## 2020-08-21 DIAGNOSIS — E559 Vitamin D deficiency, unspecified: Secondary | ICD-10-CM

## 2020-08-21 MED FILL — clonazePAM 0.5 MG TABS: 0.5 | 30 days supply | Qty: 60 | Fill #0

## 2020-08-21 MED FILL — VIT D2 1.25 MG (50,000 UNIT: 1.25 MG | 84 days supply | Qty: 12 | Fill #0

## 2020-08-21 MED FILL — ATORVASTATIN 40 MG TABLET: 40 | 90 days supply | Qty: 90 | Fill #1

## 2020-08-21 MED FILL — QUETIAPINE FUMARATE 100 MG: 100 | 30 days supply | Qty: 30 | Fill #0

## 2020-09-11 ENCOUNTER — Encounter: Payer: Self-pay | Admitting: Physician Assistant

## 2020-09-11 ENCOUNTER — Ambulatory Visit (INDEPENDENT_AMBULATORY_CARE_PROVIDER_SITE_OTHER): Payer: 59 | Admitting: Physician Assistant

## 2020-09-11 ENCOUNTER — Other Ambulatory Visit: Payer: Self-pay

## 2020-09-11 VITALS — BP 103/70 | HR 82 | Temp 99.6°F | Ht 72.0 in | Wt 190.7 lb

## 2020-09-11 DIAGNOSIS — Z131 Encounter for screening for diabetes mellitus: Secondary | ICD-10-CM | POA: Diagnosis not present

## 2020-09-11 DIAGNOSIS — Z79899 Other long term (current) drug therapy: Secondary | ICD-10-CM | POA: Diagnosis not present

## 2020-09-11 DIAGNOSIS — Z0000 Encounter for general adult medical examination without abnormal findings: Secondary | ICD-10-CM | POA: Diagnosis not present

## 2020-09-11 DIAGNOSIS — Z1159 Encounter for screening for other viral diseases: Secondary | ICD-10-CM

## 2020-09-11 DIAGNOSIS — Z1211 Encounter for screening for malignant neoplasm of colon: Secondary | ICD-10-CM

## 2020-09-11 DIAGNOSIS — E7800 Pure hypercholesterolemia, unspecified: Secondary | ICD-10-CM | POA: Diagnosis not present

## 2020-09-11 DIAGNOSIS — E781 Pure hyperglyceridemia: Secondary | ICD-10-CM | POA: Diagnosis not present

## 2020-09-11 DIAGNOSIS — E559 Vitamin D deficiency, unspecified: Secondary | ICD-10-CM

## 2020-09-11 MED FILL — OMEPRAZOLE 20 MG CAP: 20 | 90 days supply | Qty: 90 | Fill #1

## 2020-09-11 NOTE — Patient Instructions (Addendum)
Heart-Healthy Eating Plan Heart-healthy meal planning includes:  Eating less unhealthy fats.  Eating more healthy fats.  Making other changes in your diet. Talk with your doctor or a diet specialist (dietitian) to create an eating plan that is right for you. What is my plan? Your doctor may recommend an eating plan that includes:  Total fat: ______% or less of total calories a day.  Saturated fat: ______% or less of total calories a day.  Cholesterol: less than _________mg a day. What are tips for following this plan? Cooking Avoid frying your food. Try to bake, boil, grill, or broil it instead. You can also reduce fat by:  Removing the skin from poultry.  Removing all visible fats from meats.  Steaming vegetables in water or broth. Meal planning  At meals, divide your plate into four equal parts: ? Fill one-half of your plate with vegetables and green salads. ? Fill one-fourth of your plate with whole grains. ? Fill one-fourth of your plate with lean protein foods.  Eat 4-5 servings of vegetables per day. A serving of vegetables is: ? 1 cup of raw or cooked vegetables. ? 2 cups of raw leafy greens.  Eat 4-5 servings of fruit per day. A serving of fruit is: ? 1 medium whole fruit. ?  cup of dried fruit. ?  cup of fresh, frozen, or canned fruit. ?  cup of 100% fruit juice.  Eat more foods that have soluble fiber. These are apples, broccoli, carrots, beans, peas, and barley. Try to get 20-30 g of fiber per day.  Eat 4-5 servings of nuts, legumes, and seeds per week: ? 1 serving of dried beans or legumes equals  cup after being cooked. ? 1 serving of nuts is  cup. ? 1 serving of seeds equals 1 tablespoon.   General information  Eat more home-cooked food. Eat less restaurant, buffet, and fast food.  Limit or avoid alcohol.  Limit foods that are high in starch and sugar.  Avoid fried foods.  Lose weight if you are overweight.  Keep track of how much salt  (sodium) you eat. This is important if you have high blood pressure. Ask your doctor to tell you more about this.  Try to add vegetarian meals each week. Fats  Choose healthy fats. These include olive oil and canola oil, flaxseeds, walnuts, almonds, and seeds.  Eat more omega-3 fats. These include salmon, mackerel, sardines, tuna, flaxseed oil, and ground flaxseeds. Try to eat fish at least 2 times each week.  Check food labels. Avoid foods with trans fats or high amounts of saturated fat.  Limit saturated fats. ? These are often found in animal products, such as meats, butter, and cream. ? These are also found in plant foods, such as palm oil, palm kernel oil, and coconut oil.  Avoid foods with partially hydrogenated oils in them. These have trans fats. Examples are stick margarine, some tub margarines, cookies, crackers, and other baked goods. What foods can I eat? Fruits All fresh, canned (in natural juice), or frozen fruits. Vegetables Fresh or frozen vegetables (raw, steamed, roasted, or grilled). Green salads. Grains Most grains. Choose whole wheat and whole grains most of the time. Rice and pasta, including brown rice and pastas made with whole wheat. Meats and other proteins Lean, well-trimmed beef, veal, pork, and lamb. Chicken and turkey without skin. All fish and shellfish. Wild duck, rabbit, pheasant, and venison. Egg whites or low-cholesterol egg substitutes. Dried beans, peas, lentils, and tofu. Seeds and   most nuts. Dairy Low-fat or nonfat cheeses, including ricotta and mozzarella. Skim or 1% milk that is liquid, powdered, or evaporated. Buttermilk that is made with low-fat milk. Nonfat or low-fat yogurt. Fats and oils Non-hydrogenated (trans-free) margarines. Vegetable oils, including soybean, sesame, sunflower, olive, peanut, safflower, corn, canola, and cottonseed. Salad dressings or mayonnaise made with a vegetable oil. Beverages Mineral water. Coffee and tea. Diet  carbonated beverages. Sweets and desserts Sherbet, gelatin, and fruit ice. Small amounts of dark chocolate. Limit all sweets and desserts. Seasonings and condiments All seasonings and condiments. The items listed above may not be a complete list of foods and drinks you can eat. Contact a dietitian for more options. What foods should I avoid? Fruits Canned fruit in heavy syrup. Fruit in cream or butter sauce. Fried fruit. Limit coconut. Vegetables Vegetables cooked in cheese, cream, or butter sauce. Fried vegetables. Grains Breads that are made with saturated or trans fats, oils, or whole milk. Croissants. Sweet rolls. Donuts. High-fat crackers, such as cheese crackers. Meats and other proteins Fatty meats, such as hot dogs, ribs, sausage, bacon, rib-eye roast or steak. High-fat deli meats, such as salami and bologna. Caviar. Domestic duck and goose. Organ meats, such as liver. Dairy Cream, sour cream, cream cheese, and creamed cottage cheese. Whole-milk cheeses. Whole or 2% milk that is liquid, evaporated, or condensed. Whole buttermilk. Cream sauce or high-fat cheese sauce. Yogurt that is made from whole milk. Fats and oils Meat fat, or shortening. Cocoa butter, hydrogenated oils, palm oil, coconut oil, palm kernel oil. Solid fats and shortenings, including bacon fat, salt pork, lard, and butter. Nondairy cream substitutes. Salad dressings with cheese or sour cream. Beverages Regular sodas and juice drinks with added sugar. Sweets and desserts Frosting. Pudding. Cookies. Cakes. Pies. Milk chocolate or white chocolate. Buttered syrups. Full-fat ice cream or ice cream drinks. The items listed above may not be a complete list of foods and drinks to avoid. Contact a dietitian for more information. Summary  Heart-healthy meal planning includes eating less unhealthy fats, eating more healthy fats, and making other changes in your diet.  Eat a balanced diet. This includes fruits and  vegetables, low-fat or nonfat dairy, lean protein, nuts and legumes, whole grains, and heart-healthy oils and fats. This information is not intended to replace advice given to you by your health care provider. Make sure you discuss any questions you have with your health care provider. Document Revised: 08/14/2017 Document Reviewed: 07/18/2017 Elsevier Patient Education  2021 Elsevier Inc.   Preventive Care 54-91 Years Old, Male Preventive care refers to lifestyle choices and visits with your health care provider that can promote health and wellness. This includes:  A yearly physical exam. This is also called an annual wellness visit.  Regular dental and eye exams.  Immunizations.  Screening for certain conditions.  Healthy lifestyle choices, such as: ? Eating a healthy diet. ? Getting regular exercise. ? Not using drugs or products that contain nicotine and tobacco. ? Limiting alcohol use. What can I expect for my preventive care visit? Physical exam Your health care provider will check your:  Height and weight. These may be used to calculate your BMI (body mass index). BMI is a measurement that tells if you are at a healthy weight.  Heart rate and blood pressure.  Body temperature.  Skin for abnormal spots. Counseling Your health care provider may ask you questions about your:  Past medical problems.  Family's medical history.  Alcohol, tobacco, and drug use.  Emotional well-being.  Home life and relationship well-being.  Sexual activity.  Diet, exercise, and sleep habits.  Work and work Statistician.  Access to firearms. What immunizations do I need? Vaccines are usually given at various ages, according to a schedule. Your health care provider will recommend vaccines for you based on your age, medical history, and lifestyle or other factors, such as travel or where you work.   What tests do I need? Blood tests  Lipid and cholesterol levels. These may be  checked every 5 years, or more often if you are over 58 years old.  Hepatitis C test.  Hepatitis B test. Screening  Lung cancer screening. You may have this screening every year starting at age 7 if you have a 30-pack-year history of smoking and currently smoke or have quit within the past 15 years.  Prostate cancer screening. Recommendations will vary depending on your family history and other risks.  Genital exam to check for testicular cancer or hernias.  Colorectal cancer screening. ? All adults should have this screening starting at age 63 and continuing until age 26. ? Your health care provider may recommend screening at age 28 if you are at increased risk. ? You will have tests every 1-10 years, depending on your results and the type of screening test.  Diabetes screening. ? This is done by checking your blood sugar (glucose) after you have not eaten for a while (fasting). ? You may have this done every 1-3 years.  STD (sexually transmitted disease) testing, if you are at risk. Follow these instructions at home: Eating and drinking  Eat a diet that includes fresh fruits and vegetables, whole grains, lean protein, and low-fat dairy products.  Take vitamin and mineral supplements as recommended by your health care provider.  Do not drink alcohol if your health care provider tells you not to drink.  If you drink alcohol: ? Limit how much you have to 0-2 drinks a day. ? Be aware of how much alcohol is in your drink. In the [place] one drink equals one 12 oz bottle of beer (355 mL), one 5 oz glass of wine (148 mL), or one 1 oz glass of hard liquor (44 mL).   Lifestyle  Take daily care of your teeth and gums. Brush your teeth every morning and night with fluoride toothpaste. Floss one time each day.  Stay active. Exercise for at least 30 minutes 5 or more days each week.  Do not use any products that contain nicotine or tobacco, such as cigarettes, e-cigarettes, and chewing  tobacco. If you need help quitting, ask your health care provider.  Do not use drugs.  If you are sexually active, practice safe sex. Use a condom or other form of protection to prevent STIs (sexually transmitted infections).  If told by your health care provider, take low-dose aspirin daily starting at age 33.  Find healthy ways to cope with stress, such as: ? Meditation, yoga, or listening to music. ? Journaling. ? Talking to a trusted person. ? Spending time with friends and family. Safety  Always wear your seat belt while driving or riding in a vehicle.  Do not drive: ? If you have been drinking alcohol. Do not ride with someone who has been drinking. ? When you are tired or distracted. ? While texting.  Wear a helmet and other protective equipment during sports activities.  If you have firearms in your house, make sure you follow all gun safety procedures. What's next?  Go to your health care provider once a year for an annual wellness visit.  Ask your health care provider how often you should have your eyes and teeth checked.  Stay up to date on all vaccines. This information is not intended to replace advice given to you by your health care provider. Make sure you discuss any questions you have with your health care provider. Document Revised: 03/09/2019 Document Reviewed: 06/04/2018 Elsevier Patient Education  2021 [employer].

## 2020-09-11 NOTE — Progress Notes (Signed)
Male physical   Impression and Recommendations:    1. Healthcare maintenance   2. Hypertriglyceridemia   3. On statin therapy   4. Vitamin D deficiency   5. Encounter for hepatitis C screening test for low risk patient   6. High cholesterol   7. Screening for diabetes mellitus   8. Screening for colon cancer      1) Anticipatory Guidance: Skin CA prevention- recommend to use sunscreen when outside along with skin surveillance; eating a balanced and modest diet; physical activity at least 25 minutes per day or minimum of 150 min/ week moderate to intense activity.  2) Immunizations / Screenings / Labs:   All immunizations are up-to-date per recommendations or will be updated today if pt allows.    - Patient understands with dental and vision screens they will schedule independently.  - Will obtain CMP, HgA1c, Lipid panel, vit D when fasting. Patient agreeable to Hep C screening and screening colonoscopy. - Declined HIV screening.    3) Weight:  Recommend to improve diet habits to improve overall feelings of well being and objective health data. Improve nutrient density of diet through increasing intake of fruits and vegetables and decreasing saturated fats, white flour products and refined sugars.   4) Healthcare Maintenance: -Recommend to follow a heart healthy diet and reduce eating out/fast food. -Continue good hydration and to stay as active as possible. -Recommend to limit etoh to 2 drinks/day, no more than 14/wk. Encourage to reduce tobacco use.  -Continue current medication regimen. -Follow up in 4 months for HDL, Mood, GERD   Orders Placed This Encounter  Procedures  . Comp Met (CMET)  . Lipid Profile  . HgB A1c  . Hepatitis C Antibody  . Vitamin D (25 hydroxy)  . Ambulatory referral to Gastroenterology    Referral Priority:   Routine    Referral Type:   Consultation    Referral Reason:   Specialty Services Required    Number of Visits Requested:   1     No orders of the defined types were placed in this encounter.    Return in 4 months (on 01/11/2021) for HLD, GERD, Mood.  Reminded pt important of f-up preventative CPE in 1 year.  Reminded pt again, this is in addition to any chronic care visits.    Gross side effects, risk and benefits, and alternatives of medications discussed with patient.  Patient is aware that all medications have potential side effects and we are unable to predict every side effect or drug-drug interaction that may occur.  Expresses verbal understanding and consents to current therapy plan and treatment regimen.  Please see AVS handed out to patient at the end of our visit for further patient instructions/ counseling done pertaining to today's office visit.       Subjective:        CC: CPE   HPI: Steven Herrera is a 50 y.o. male who presents to [place] [facility] today for a yearly health maintenance exam.     Health Maintenance Summary  - Reviewed and updated, unless pt declines services.  Last Cologuard or Colonoscopy:   Placed order. Family history of Colon CA:  N  Tobacco History Reviewed:  Y, formerly quit and restarted smoking 5 cig./day couple months ago Abdominal Ultrasound:  N/A CT scan for screening lung CA: N/A  Alcohol / drug use:  2-3 beers/day; no use Dental Home:  Y Eye exams:Y Dermatology home:  N  Male history: STD concerns:   none Additional penile/ urinary concerns: none   Additional concerns beyond Health Maintenance issues:  none    Immunization History  Administered Date(s) Administered  . Hepatitis B, adult 02/09/2014, 04/05/2014  . Hepatitis B, ped/adol 11/05/2013  . Tdap 08/10/2018     Health Maintenance  Topic Date Due  . Hepatitis C Screening  Never done  . COVID-19 Vaccine (1) Never done  . COLONOSCOPY (Pts 45-42yrs Insurance coverage will need to be confirmed)  Never done  . INFLUENZA VACCINE  09/21/2020 (Originally  01/23/2020)  . TETANUS/TDAP  08/10/2028  . HIV Screening  Completed  . HPV VACCINES  Aged Out       Wt Readings from Last 3 Encounters:  09/11/20 190 lb 11.2 oz (86.5 kg)  05/08/20 185 lb 4.8 oz (84.1 kg)  08/30/19 210 lb 9.6 oz (95.5 kg)   BP Readings from Last 3 Encounters:  09/11/20 103/70  05/08/20 107/71  08/30/19 (!) 151/86   Pulse Readings from Last 3 Encounters:  09/11/20 82  05/08/20 76  07/28/19 75    Patient Active Problem List   Diagnosis Date Noted  . Deep vein thrombosis (DVT) of both lower extremities ([place]) 05/24/2019  . Hypertriglyceridemia 02/08/2019  . Contact dermatitis 01/06/2019  . Vitamin D deficiency 06/01/2018  . High cholesterol 02/09/2018  . 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 On statin therapy 08/25/2017  . Healthcare maintenance 07/11/2017  . Depression with anxiety 06/27/2017  . Cerebrovascular accident (CVA) ([place]) 06/27/2017  . Acute ischemic stroke ([place]) 06/26/2017  . Elevated blood-pressure reading without diagnosis of hypertension 06/26/2017  . NEVUS 02/13/2007    Past Medical History:  Diagnosis Date  . Depression   . High cholesterol   . PFO (patent foramen ovale)    a. s/p PFO closure by Dr. Burt Knack on 11/19/17  . Stroke [facility])     Past Surgical History:  Procedure Laterality Date  . big toe amputation Right   . CARPAL TUNNEL RELEASE    . LOOP RECORDER INSERTION N/A 09/25/2017   Procedure: LOOP RECORDER INSERTION;  Surgeon: [name], MD;  Location: [place] [facility];  Service: Cardiovascular;  Laterality: N/A;  . MOUTH SURGERY    . PATENT FORAMEN OVALE(PFO) CLOSURE N/A 11/19/2017   Procedure: PATENT FORAMEN OVALE (PFO) CLOSURE;  Surgeon: [name], MD;  Location: [place] [facility];  Service: Cardiovascular;  Laterality: N/A;  . spider bite    . TEE WITHOUT CARDIOVERSION N/A 09/25/2017   Procedure: TRANSESOPHAGEAL ECHOCARDIOGRAM (TEE) WITH LOOP;  Surgeon: Pixie Casino, MD;  Location: [facility] ENDOSCOPY;   Service: Cardiovascular;  Laterality: N/A;  . VASECTOMY      Family History  Problem Relation Age of Onset  . Stroke Father        disabling CVA in early-mid 50's  . Hypertension Father   . Alcohol abuse Father   . Heart disease Mother   . Dementia Mother   . Atrial fibrillation Mother   . Drug abuse Sister   . Hypertension Brother   . Healthy Sister     Social History   Substance and Sexual Activity  Drug Use Yes  . Types: Marijuana   Comment: takes daily for pain leisure   ,  Social History   Substance and Sexual Activity  Alcohol Use Yes  . Alcohol/week: 6.0 standard drinks  . Types: 6 Cans of beer per week   Comment: 6 pack nightly  ,  Social History   Tobacco Use  Smoking Status Former Smoker  . Packs/day: 0.25  . Years: 28.00  . Pack years: 7.00  . Types: Cigarettes  . Quit date: 07/25/2017  . Years since quitting: 3.1  Smokeless Tobacco Never Used  ,  Social History   Substance and Sexual Activity  Sexual Activity Yes  . Birth control/protection: Surgical    Patient's Medications  New Prescriptions   No medications on file  Previous Medications   ASPIRIN EC 81 MG TABLET    Take 81 mg by mouth daily with lunch.    ATORVASTATIN (LIPITOR) 40 MG TABLET    TAKE 1 TABLET BY MOUTH ONCE DAILY AT 6PM.   CLONAZEPAM (KLONOPIN) 0.5 MG TABLET    Take 1 tablet (0.5 mg total) by mouth 2 (two) times daily.   MENTHOL, TOPICAL ANALGESIC, (ICY HOT EX)    Apply 1 application topically 3 (three) times daily as needed (pain).    MULTIPLE VITAMIN (MULTIVITAMIN WITH MINERALS) TABS TABLET    Take 1 tablet by mouth daily with lunch.   NAPROXEN SODIUM (ALEVE) 220 MG TABLET    Take 220-440 mg by mouth 3 (three) times daily as needed (pain/headaches).   OMEGA-3 ACID ETHYL ESTERS (LOVAZA) 1 G CAPSULE    TAKE 2 CAPSULES BY MOUTH 2 TIMES DAILY.   OMEPRAZOLE (PRILOSEC) 20 MG CAPSULE    TAKE 1 CAPSULE BY MOUTH DAILY.   QUETIAPINE (SEROQUEL) 100 MG TABLET    Take 1 tablet (100 mg  total) by mouth at bedtime.   VITAMIN D, ERGOCALCIFEROL, (DRISDOL) 1.25 MG (50000 UNIT) CAPS CAPSULE    TAKE 1 CAPSULE BY MOUTH EVERY 7 DAYS.  Modified Medications   No medications on file  Discontinued Medications   No medications on file    Ibuprofen and Penicillins  Review of Systems: General:   Denies fever, chills, unexplained weight loss.  Optho/Auditory:   Denies visual changes, blurred vision/LOV Respiratory:   Denies SOB, DOE more than baseline levels.   Cardiovascular:   Denies chest pain, palpitations, new onset peripheral edema  Gastrointestinal:   Denies nausea, vomiting, diarrhea.  Genitourinary: Denies dysuria, freq/ urgency, flank pain or discharge from genitals.  Endocrine:     Denies hot or cold intolerance, polyuria, polydipsia. Musculoskeletal:   Denies unexplained myalgias, joint swelling, +arthralgias, gait problems.  Skin:  Denies rash, suspicious lesions Neurological:     Denies dizziness, unexplained weakness, numbness  Psychiatric/Behavioral:   Denies mood changes, suicidal or homicidal ideations, hallucinations    Objective:     Blood pressure 103/70, pulse 82, temperature 99.6 F (37.6 C), height 6' (1.829 m), weight 190 lb 11.2 oz (86.5 kg), SpO2 98 %. Body mass index is 25.86 kg/m. General Appearance:    Alert, cooperative, no distress, appears stated age  Head:    Normocephalic, without obvious abnormality, atraumatic  Eyes:    PERRL, conjunctiva/corneas clear, EOM's intact, both eyes  Ears:    Normal TM's and external ear canals, both ears  Nose:   Nares normal, septum midline, mucosa normal, no drainage    or sinus tenderness  Throat:   Lips w/o lesion, mucosa moist, and tongue normal; teeth and gums fair  Neck:   Supple, symmetrical, trachea midline, no adenopathy;    thyroid:  no enlargement/tenderness/nodules; no JVD  Back:     Symmetric, no curvature, ROM normal, no CVA tenderness  Lungs:     Clear to auscultation bilaterally,  respirations unlabored, no Wh/ R/ R  Chest Wall:    No tenderness or gross deformity; normal excursion   Heart:    Regular rate and rhythm, S1 and S2 normal, no murmur, rub   or gallop  Abdomen:     Soft, non-tender, bowel sounds active all four quadrants, No G/R/R, no masses, no organomegaly  Genitalia:   Deferred. No concerns/sxs.  Rectal:   Deferred. No concerns/sxs.  Extremities:   Extremities normal (some DIP/PIP thickening associated with arthritis), atraumatic, no cyanosis or gross edema  Pulses:   2+ and symmetric all extremities  Skin:   Warm, dry, Skin color, texture, turgor normal, no obvious rashes or lesions  M-Sk:   Ambulates * 4 w/o difficulty, no gross deformities, tone WNL  Neurologic:   CNII-XII grossly intact Psych:  No HI/SI, judgement and insight good, Euthymic mood. Full Affect.

## 2020-09-12 LAB — LIPID PANEL
Chol/HDL Ratio: 4.7 ratio (ref 0.0–5.0)
Cholesterol, Total: 141 mg/dL (ref 100–199)
HDL: 30 mg/dL — ABNORMAL LOW (ref >39)
LDL Chol Calc (NIH): 86 mg/dL (ref 0–99)
Triglycerides: 139 mg/dL (ref 0–149)
VLDL Cholesterol Cal: 25 mg/dL (ref 5–40)

## 2020-09-12 LAB — COMPREHENSIVE METABOLIC PANEL
ALT: 23 IU/L (ref 0–44)
AST: 25 IU/L (ref 0–40)
Albumin/Globulin Ratio: 1.5 (ref 1.2–2.2)
Albumin: 4.3 g/dL (ref 4.0–5.0)
Alkaline Phosphatase: 78 IU/L (ref 44–121)
BUN/Creatinine Ratio: 12 (ref 9–20)
BUN: 11 mg/dL (ref 6–24)
Bilirubin Total: 0.4 mg/dL (ref 0.0–1.2)
CO2: 23 mmol/L (ref 20–29)
Calcium: 9.5 mg/dL (ref 8.7–10.2)
Chloride: 103 mmol/L (ref 96–106)
Creatinine, Ser: 0.93 mg/dL (ref 0.76–1.27)
Globulin, Total: 2.9 g/dL (ref 1.5–4.5)
Glucose: 99 mg/dL (ref 65–99)
Potassium: 4.5 mmol/L (ref 3.5–5.2)
Sodium: 139 mmol/L (ref 134–144)
Total Protein: 7.2 g/dL (ref 6.0–8.5)
eGFR: 101 mL/min/{1.73_m2} (ref >59)

## 2020-09-12 LAB — VITAMIN D 25 HYDROXY (VIT D DEFICIENCY, FRACTURES): Vit D, 25-Hydroxy: 58 ng/mL (ref 30.0–100.0)

## 2020-09-12 LAB — HEMOGLOBIN A1C
Est. average glucose Bld gHb Est-mCnc: 111 mg/dL
Hgb A1c MFr Bld: 5.5 % (ref 4.8–5.6)

## 2020-09-12 LAB — HEPATITIS C ANTIBODY: Hep C Virus Ab: <0.1 s/co ratio (ref 0.0–0.9)

## 2020-09-14 ENCOUNTER — Other Ambulatory Visit (HOSPITAL_BASED_OUTPATIENT_CLINIC_OR_DEPARTMENT_OTHER): Payer: Self-pay

## 2020-09-20 ENCOUNTER — Encounter: Payer: Self-pay | Admitting: Gastroenterology

## 2020-09-27 ENCOUNTER — Other Ambulatory Visit (HOSPITAL_COMMUNITY): Payer: Self-pay

## 2020-09-27 MED FILL — Clonazepam Tab 0.5 MG: ORAL | 30 days supply | Qty: 60 | Fill #0 | Status: AC

## 2020-09-27 MED FILL — Quetiapine Fumarate Tab 100 MG: ORAL | 30 days supply | Qty: 30 | Fill #0 | Status: AC

## 2020-10-26 ENCOUNTER — Telehealth (HOSPITAL_COMMUNITY): Payer: 59 | Admitting: Psychiatry

## 2020-10-30 ENCOUNTER — Other Ambulatory Visit: Payer: Self-pay

## 2020-10-30 ENCOUNTER — Other Ambulatory Visit (HOSPITAL_COMMUNITY): Payer: Self-pay

## 2020-10-30 ENCOUNTER — Ambulatory Visit (AMBULATORY_SURGERY_CENTER): Payer: Self-pay | Admitting: *Deleted

## 2020-10-30 VITALS — Ht 72.0 in | Wt 187.0 lb

## 2020-10-30 DIAGNOSIS — Z1211 Encounter for screening for malignant neoplasm of colon: Secondary | ICD-10-CM

## 2020-10-30 MED ORDER — NA SULFATE-K SULFATE-MG SULF 17.5-3.13-1.6 GM/177ML PO SOLN
ORAL | 0 refills | Status: DC
  Filled 2020-10-30: qty 354, 1d supply, fill #0

## 2020-10-30 NOTE — Progress Notes (Signed)
Patient is here in-person for PV. Patient denies any allergies to eggs or soy. Patient denies any problems with anesthesia/sedation. Patient denies any oxygen use at home. Patient denies taking any diet/weight loss medications or blood thinners. Patient is not being treated for MRSA or C-diff. Patient is aware of our care-partner policy and [id number] safety protocol. EMMI education assigned to the patient for the procedure, sent to [place].  Patient is aware NOT to use any chewing tobacco the day of procedure.

## 2020-11-02 ENCOUNTER — Other Ambulatory Visit (HOSPITAL_COMMUNITY): Payer: Self-pay

## 2020-11-02 MED FILL — Quetiapine Fumarate Tab 100 MG: ORAL | 30 days supply | Qty: 30 | Fill #1 | Status: AC

## 2020-11-02 MED FILL — Clonazepam Tab 0.5 MG: ORAL | 30 days supply | Qty: 60 | Fill #1 | Status: AC

## 2020-11-09 ENCOUNTER — Encounter: Payer: Self-pay | Admitting: Gastroenterology

## 2020-11-14 ENCOUNTER — Encounter: Payer: Self-pay | Admitting: Gastroenterology

## 2020-11-14 ENCOUNTER — Ambulatory Visit (AMBULATORY_SURGERY_CENTER): Payer: 59 | Admitting: Gastroenterology

## 2020-11-14 ENCOUNTER — Other Ambulatory Visit: Payer: Self-pay

## 2020-11-14 VITALS — BP 120/83 | HR 58 | Temp 98.4°F | Resp 21 | Ht 72.0 in | Wt 187.0 lb

## 2020-11-14 DIAGNOSIS — Z1211 Encounter for screening for malignant neoplasm of colon: Secondary | ICD-10-CM | POA: Diagnosis not present

## 2020-11-14 DIAGNOSIS — K5730 Diverticulosis of large intestine without perforation or abscess without bleeding: Secondary | ICD-10-CM

## 2020-11-14 DIAGNOSIS — K641 Second degree hemorrhoids: Secondary | ICD-10-CM | POA: Diagnosis not present

## 2020-11-14 DIAGNOSIS — D128 Benign neoplasm of rectum: Secondary | ICD-10-CM | POA: Diagnosis not present

## 2020-11-14 DIAGNOSIS — K635 Polyp of colon: Secondary | ICD-10-CM | POA: Diagnosis not present

## 2020-11-14 DIAGNOSIS — D125 Benign neoplasm of sigmoid colon: Secondary | ICD-10-CM

## 2020-11-14 DIAGNOSIS — K621 Rectal polyp: Secondary | ICD-10-CM

## 2020-11-14 MED ORDER — FLEET ENEMA 7-19 GM/118ML RE ENEM
1.0000 | ENEMA | Freq: Once | RECTAL | Status: AC
Start: 2020-11-14 — End: 2020-11-14
  Administered 2020-11-14: 1 via RECTAL

## 2020-11-14 MED ORDER — SODIUM CHLORIDE 0.9 % IV SOLN: 500.0000 mL | Freq: Once | INTRAVENOUS | Status: DC

## 2020-11-14 NOTE — Patient Instructions (Signed)
Handouts given for hemorrhoids, polyps, diverticulosis and high fiber diet.  YOU HAD AN ENDOSCOPIC PROCEDURE TODAY AT [facility]:   Refer to the procedure report that was given to you for any specific questions about what was found during the examination.  If the procedure report does not answer your questions, please call your gastroenterologist to clarify.  If you requested that your care partner not be given the details of your procedure findings, then the procedure report has been included in a sealed envelope for you to review at your convenience later.  YOU SHOULD EXPECT: Some feelings of bloating in the abdomen. Passage of more gas than usual.  Walking can help get rid of the air that was put into your GI tract during the procedure and reduce the bloating. If you had a lower endoscopy (such as a colonoscopy or flexible sigmoidoscopy) you may notice spotting of blood in your stool or on the toilet paper. If you underwent a bowel prep for your procedure, you may not have a normal bowel movement for a few days.  Please Note:  You might notice some irritation and congestion in your nose or some drainage.  This is from the oxygen used during your procedure.  There is no need for concern and it should clear up in a day or so.  SYMPTOMS TO REPORT IMMEDIATELY:   Following lower endoscopy (colonoscopy or flexible sigmoidoscopy):  Excessive amounts of blood in the stool  Significant tenderness or worsening of abdominal pains  Swelling of the abdomen that is new, acute  Fever of 100F or higher  For urgent or emergent issues, a gastroenterologist can be reached at any hour by calling [phone]. Do not use MyChart messaging for urgent concerns.    DIET:  We do recommend a small meal at first, but then you may proceed to your regular diet.  Drink plenty of fluids but you should avoid alcoholic beverages for 24 hours.  ACTIVITY:  You should plan to take it easy for the rest of  today and you should NOT DRIVE or use heavy machinery until tomorrow (because of the sedation medicines used during the test).    FOLLOW UP: Our staff will call the number listed on your records 48-72 hours following your procedure to check on you and address any questions or concerns that you may have regarding the information given to you following your procedure. If we do not reach you, we will leave a message.  We will attempt to reach you two times.  During this call, we will ask if you have developed any symptoms of COVID 19. If you develop any symptoms (ie: fever, flu-like symptoms, shortness of breath, cough etc.) before then, please call [phone].  If you test positive for Covid 19 in the 2 weeks post procedure, please call and report this information to [place].    If any biopsies were taken you will be contacted by phone or by letter within the next 1-3 weeks.  Please call us at [phone] if you have not heard about the biopsies in 3 weeks.    SIGNATURES/CONFIDENTIALITY: You and/or your care partner have signed paperwork which will be entered into your electronic medical record.  These signatures attest to the fact that that the information above on your After Visit Summary has been reviewed and is understood.  Full responsibility of the confidentiality of this discharge information lies with you and/or your care-partner.

## 2020-11-14 NOTE — Op Note (Signed)
[facility] Patient Name: Steven Herrera Procedure Date: 11/14/2020 9:50 AM MRN: [id number] Endoscopist: Gerrit Heck , MD Age: 50 Referring MD:  Date of Birth: 15-Sep-1970 Gender: Male Account #: [id number] Procedure:                Colonoscopy Indications:              Screening for colorectal malignant neoplasm, This                            is the patient's first colonoscopy Medicines:                Monitored Anesthesia Care Procedure:                Pre-Anesthesia Assessment:                           - Prior to the procedure, a History and Physical                            was performed, and patient medications and                            allergies were reviewed. The patient's tolerance of                            previous anesthesia was also reviewed. The risks                            and benefits of the procedure and the sedation                            options and risks were discussed with the patient.                            All questions were answered, and informed consent                            was obtained. Prior Anticoagulants: The patient has                            taken no previous anticoagulant or antiplatelet                            agents. ASA Grade Assessment: II - A patient with                            mild systemic disease. After reviewing the risks                            and benefits, the patient was deemed in                            satisfactory condition to undergo the procedure.  After obtaining informed consent, the colonoscope                            was passed under direct vision. Throughout the                            procedure, the patient's blood pressure, pulse, and                            oxygen saturations were monitored continuously. The                            Olympus CF-HQ190 [id number]) Colonoscope was                            introduced through the anus  and advanced to the the                            terminal ileum. The colonoscopy was performed                            without difficulty. The patient tolerated the                            procedure well. The quality of the bowel                            preparation was good. The terminal ileum, ileocecal                            valve, appendiceal orifice, and rectum were                            photographed. Scope In: 10:02:20 AM Scope Out: 10:25:49 AM Scope Withdrawal Time: 0 hours 19 minutes 25 seconds  Total Procedure Duration: 0 hours 23 minutes 29 seconds  Findings:                 The perianal and digital rectal examinations were                            normal.                           A 3 mm polyp was found in the sigmoid colon. The                            polyp was sessile. The polyp was removed with a                            cold snare. Resection and retrieval were complete.                            Estimated blood loss was minimal.  Two sessile polyps were found in the rectum. The                            polyps were 2 to 4 mm in size. These polyps were                            removed with a cold snare. Resection and retrieval                            were complete. Estimated blood loss was minimal.                           A few small-mouthed diverticula were found in the                            sigmoid colon.                           Non-bleeding internal hemorrhoids were found during                            retroflexion. The hemorrhoids were medium-sized and                            Grade II (internal hemorrhoids that prolapse but                            reduce spontaneously).                           The terminal ileum appeared normal. Complications:            No immediate complications. Estimated Blood Loss:     Estimated blood loss was minimal. Impression:               - One 3 mm polyp in the  sigmoid colon, removed with                            a cold snare. Resected and retrieved.                           - Two 2 to 4 mm polyps in the rectum, removed with                            a cold snare. Resected and retrieved.                           - Diverticulosis in the sigmoid colon.                           - Non-bleeding internal hemorrhoids.                           - The examined portion of the ileum was normal. Recommendation:           -  Patient has a contact number available for                            emergencies. The signs and symptoms of potential                            delayed complications were discussed with the                            patient. Return to normal activities tomorrow.                            Written discharge instructions were provided to the                            patient.                           - Resume previous diet.                           - Continue present medications.                           - Await pathology results.                           - Repeat colonoscopy for surveillance based on                            pathology results.                           - Return to GI office PRN.                           - Use fiber, for example Citrucel, Fibercon, Konsyl                            or Metamucil.                           - Internal hemorrhoids were noted on this study and                            may be amenable to hemorrhoid band ligation. If you                            are interested in further treatment of these                            hemorrhoids with band ligation, please contact my                            clinic to set up an appointment for evaluation and  treatment. Gerrit Heck, MD 11/14/2020 10:32:43 AM

## 2020-11-14 NOTE — Progress Notes (Signed)
pt tolerated well. VSS. awake and to recovery. Report given to RN.  

## 2020-11-14 NOTE — Progress Notes (Signed)
Pt's states no medical or surgical changes since previsit or office visit. 

## 2020-11-16 ENCOUNTER — Telehealth: Payer: Self-pay

## 2020-11-16 NOTE — Telephone Encounter (Signed)
  Follow up Call-  Call back number 11/14/2020  Post procedure Call Back phone  # [phone]  Permission to leave phone message Yes  Some recent data might be hidden     Patient questions:  Do you have a fever, pain , or abdominal swelling? No. Pain Score  0 *  Have you tolerated food without any problems? Yes.    Have you been able to return to your normal activities? Yes.    Do you have any questions about your discharge instructions: Diet   No. Medications  No. Follow up visit  No.  Do you have questions or concerns about your Care? No.  Actions: * If pain score is 4 or above: No action needed, pain <4.  1. Have you developed a fever since your procedure? No     2.   Have you had an respiratory symptoms (SOB or cough) since your procedure? No   3.   Have you tested positive for COVID 19 since your procedure no   4.   Have you had any family members/close contacts diagnosed with the COVID 19 since your procedure?  No    If yes to any of these questions please route to Joylene John, RN and Joella Prince, RN

## 2020-11-23 ENCOUNTER — Encounter: Payer: Self-pay | Admitting: Gastroenterology

## 2020-12-11 ENCOUNTER — Other Ambulatory Visit (HOSPITAL_COMMUNITY): Payer: Self-pay

## 2020-12-11 ENCOUNTER — Other Ambulatory Visit: Payer: Self-pay | Admitting: Physician Assistant

## 2020-12-11 DIAGNOSIS — E781 Pure hyperglyceridemia: Secondary | ICD-10-CM

## 2020-12-11 DIAGNOSIS — K219 Gastro-esophageal reflux disease without esophagitis: Secondary | ICD-10-CM

## 2020-12-11 MED ORDER — OMEPRAZOLE 20 MG PO CPDR
DELAYED_RELEASE_CAPSULE | Freq: Every day | ORAL | 1 refills | Status: DC
  Filled 2020-12-11: qty 90, 90d supply, fill #0
  Filled 2021-03-14: qty 90, 90d supply, fill #1

## 2020-12-11 MED ORDER — ATORVASTATIN CALCIUM 40 MG PO TABS
ORAL_TABLET | ORAL | 1 refills | Status: DC
  Filled 2020-12-11: qty 90, 90d supply, fill #0
  Filled 2021-03-14: qty 90, 90d supply, fill #1

## 2020-12-11 MED FILL — Quetiapine Fumarate Tab 100 MG: ORAL | 30 days supply | Qty: 30 | Fill #2 | Status: AC

## 2020-12-11 MED FILL — Clonazepam Tab 0.5 MG: ORAL | 30 days supply | Qty: 60 | Fill #2 | Status: AC

## 2021-01-15 ENCOUNTER — Encounter: Payer: Self-pay | Admitting: Physician Assistant

## 2021-01-15 ENCOUNTER — Other Ambulatory Visit: Payer: Self-pay

## 2021-01-15 ENCOUNTER — Ambulatory Visit (INDEPENDENT_AMBULATORY_CARE_PROVIDER_SITE_OTHER): Payer: 59 | Admitting: Physician Assistant

## 2021-01-15 VITALS — BP 105/67 | HR 76 | Temp 98.0°F | Ht 72.0 in | Wt 175.2 lb

## 2021-01-15 DIAGNOSIS — E782 Mixed hyperlipidemia: Secondary | ICD-10-CM

## 2021-01-15 DIAGNOSIS — F319 Bipolar disorder, unspecified: Secondary | ICD-10-CM

## 2021-01-15 DIAGNOSIS — F418 Other specified anxiety disorders: Secondary | ICD-10-CM

## 2021-01-15 DIAGNOSIS — Z716 Tobacco abuse counseling: Secondary | ICD-10-CM

## 2021-01-15 DIAGNOSIS — K219 Gastro-esophageal reflux disease without esophagitis: Secondary | ICD-10-CM | POA: Diagnosis not present

## 2021-01-15 NOTE — Patient Instructions (Signed)

## 2021-01-15 NOTE — Assessment & Plan Note (Signed)
-  Followed by psychiatry. -On Seroquel and Clonazepam.

## 2021-01-15 NOTE — Progress Notes (Signed)
Established Patient Office Visit  Subjective:  Patient ID: Steven Herrera, male    DOB: Sep 17, 1970  Age: 50 y.o. MRN: [id number]  CC:  Chief Complaint  Patient presents with   Follow-up    GERD, Mood   Hyperlipidemia    HPI Steven Herrera presents for follow up on mood, hyperlipidemia and GERD.  Mood: Followed by Psychiatry. Denies mood changes. Reports medication compliance.   HLD: Pt taking medication as directed without issues. Reports has chronic joint pain, denies worsening symptoms. Reports diet is poor, eats too many cheeseburgers. Has been staying busy with working outside. Reports staying hydrated.  GERD: Patient takes omeprazole daily. States if he misses a dose with have a flare-up.   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 No major diet changes which states could do better.  Tobacco use: Patient reports is not ready to quit. In the past has been able to quit. Smoking is a coping mechanism for stress. States he knows he should stop smoking especially with hx of stroke.   Past Medical History:  Diagnosis Date   Depression    High cholesterol    PFO (patent foramen ovale)    a. s/p PFO closure by Dr. Burt Knack on 11/19/17   Smoker    Stroke ([place]) 2019    Past Surgical History:  Procedure Laterality Date   big toe amputation Right    CARPAL TUNNEL RELEASE     LOOP RECORDER INSERTION N/A 09/25/2017   Procedure: LOOP RECORDER INSERTION;  Surgeon: [name], MD;  Location: [facility];  Service: Cardiovascular;  Laterality: N/A;   MOUTH SURGERY     PATENT FORAMEN OVALE(PFO) CLOSURE N/A 11/19/2017   Procedure: PATENT FORAMEN OVALE (PFO) CLOSURE;  Surgeon: [name], MD;  Location: [facility];  Service: Cardiovascular;  Laterality: N/A;   spider bite     TEE WITHOUT CARDIOVERSION N/A 09/25/2017   Procedure: TRANSESOPHAGEAL ECHOCARDIOGRAM (TEE) WITH LOOP;  Surgeon: Pixie Casino,  MD;  Location: [facility];  Service: Cardiovascular;  Laterality: N/A;   VASECTOMY      Family History  Problem Relation Age of Onset   Stroke Father        disabling CVA in early-mid 8's   Hypertension Father    Alcohol abuse Father    Heart disease Mother    Dementia Mother    Atrial fibrillation Mother    Drug abuse Sister    Hypertension Brother    Healthy Sister    Colon cancer Neg Hx    Colon polyps Neg Hx    Esophageal cancer Neg Hx    Rectal cancer Neg Hx    Stomach cancer Neg Hx     Social History   Socioeconomic History   Marital status: Married    Spouse name: Not on file   Number of children: 2   Years of education: Not on file   Highest education level: 8th grade  Occupational History   Not on file  Tobacco Use   Smoking status: Every Day    Packs/day: 0.25    Years: 28.00    Pack years: 7.00    Types: Cigarettes, Cigars   Smokeless tobacco: Current    Types: Chew   Tobacco comments:    last used 11/12/20  Vaping Use   Vaping Use: Former  Substance and Sexual Activity   Alcohol use: Yes    Alcohol/week: 14.0 - 20.0 standard  drinks    Types: 14 - 20 Cans of beer per week    Comment: 2-3 beers per night per pt   Drug use: Not Currently    Types: Marijuana   Sexual activity: Yes    Birth control/protection: Surgical  Other Topics Concern   Not on file  Social History Narrative   Not on file   Social Determinants of Health   Financial Resource Strain: Not on file  Food Insecurity: Not on file  Transportation Needs: Not on file  Physical Activity: Not on file  Stress: Not on file  Social Connections: Not on file  Intimate Partner Violence: Not on file    Outpatient Medications Prior to Visit  Medication Sig Dispense Refill   aspirin EC 81 MG tablet Take 81 mg by mouth daily with lunch.      atorvastatin (LIPITOR) 40 MG tablet TAKE 1 TABLET BY MOUTH ONCE DAILY AT 6PM. 90 tablet 1   clonazePAM (KLONOPIN) 0.5 MG tablet TAKE 1 TABLET BY  MOUTH 2 TIMES DAILY. 60 tablet 5   Menthol, Topical Analgesic, (ICY HOT EX) Apply 1 application topically 3 (three) times daily as needed (pain).      Multiple Vitamin (MULTIVITAMIN WITH MINERALS) TABS tablet Take 1 tablet by mouth daily with lunch.     omega-3 acid ethyl esters (LOVAZA) 1 g capsule TAKE 2 CAPSULES BY MOUTH 2 TIMES DAILY. 240 capsule 0   omeprazole (PRILOSEC) 20 MG capsule TAKE 1 CAPSULE BY MOUTH DAILY. 90 capsule 1   QUEtiapine (SEROQUEL) 100 MG tablet TAKE 1 TABLET BY MOUTH AT BEDTIME 30 tablet 5   Vitamin D, Ergocalciferol, (DRISDOL) 1.25 MG (50000 UNIT) CAPS capsule TAKE 1 CAPSULE BY MOUTH EVERY 7 DAYS 12 capsule 0   No facility-administered medications prior to visit.    Allergies  Allergen Reactions   Ibuprofen Other (See Comments)    Bloody stools   Penicillins Hives    Has patient had a PCN reaction causing immediate rash, facial/tongue/throat swelling, SOB or lightheadedness with hypotension: No Has patient had a PCN reaction causing severe rash involving mucus membranes or skin necrosis: Yes Has patient had a PCN reaction that required hospitalization: no Has patient had a PCN reaction occurring within the last 10 years: no If all of the above answers are "NO", then may proceed with Cephalosporin use.    ROS Review of Systems Review of Systems:  A fourteen system review of systems was performed and found to be positive as per HPI.   Objective:    Physical Exam General:  Well Developed, well nourished, in no acute distress  Neuro:  Alert and oriented,  extra-ocular muscles intact  HEENT:  Normocephalic, atraumatic, neck supple Skin:  no gross rash, warm, pink. Cardiac:  RRR, S1 S2 Respiratory:  ECTA B/L, Not using accessory muscles, speaking in full sentences- unlabored. Vascular:  Ext warm, no cyanosis apprec.; cap RF less 2 sec. Psych:  No HI/SI, judgement and insight good, Euthymic mood. Full Affect.  BP 105/67   Pulse 76   Temp 98 F (36.7 C)    Ht 6' (1.829 m)   Wt 175 lb 3.2 oz (79.5 kg)   SpO2 98%   BMI 23.76 kg/m  Wt Readings from Last 3 Encounters:  01/15/21 175 lb 3.2 oz (79.5 kg)  11/14/20 187 lb (84.8 kg)  10/30/20 187 lb (84.8 kg)     Health Maintenance Due  Topic Date Due   COVID-19 Vaccine (1) Never done  Pneumococcal Vaccine 66-38 Years old (1 - PCV) Never done    There are no preventive care reminders to display for this patient.  Lab Results  Component Value Date   TSH 0.653 08/30/2019   Lab Results  Component Value Date   WBC 6.6 05/08/2020   HGB 16.3 05/08/2020   HCT 48.3 05/08/2020   MCV 93 05/08/2020   PLT 249 05/08/2020   Lab Results  Component Value Date   NA 139 09/11/2020   K 4.5 09/11/2020   CO2 23 09/11/2020   GLUCOSE 99 09/11/2020   BUN 11 09/11/2020   CREATININE 0.93 09/11/2020   BILITOT 0.4 09/11/2020   ALKPHOS 78 09/11/2020   AST 25 09/11/2020   ALT 23 09/11/2020   PROT 7.2 09/11/2020   ALBUMIN 4.3 09/11/2020   CALCIUM 9.5 09/11/2020   ANIONGAP 11 01/07/2019   EGFR 101 09/11/2020   Lab Results  Component Value Date   CHOL 141 09/11/2020   Lab Results  Component Value Date   HDL 30 (L) 09/11/2020   Lab Results  Component Value Date   LDLCALC 86 09/11/2020   Lab Results  Component Value Date   TRIG 139 09/11/2020   Lab Results  Component Value Date   CHOLHDL 4.7 09/11/2020   Lab Results  Component Value Date   HGBA1C 5.5 09/11/2020      Assessment & Plan:   Problem List Items Addressed This Visit       Other   Depression with anxiety    -Followed by psychiatry. -On Seroquel and Clonazepam.        Other Visit Diagnoses     Elevated triglycerides with high cholesterol    -  Primary   Gastroesophageal reflux disease, unspecified whether esophagitis present       Encounter for tobacco use cessation counseling       Bipolar 1 disorder ([place])          Bipolar 1 disorder: -Followed by psychiatry.  -On Seroquel.   Elevated triglycerides  with high cholesterol: -Last lipid panel: total cholesterol 141, triglycerides 139, HDL 30, LDL 86 (goal <70). -Recommend to reduce saturated and trans fats, continue to stay active. -Continue current medication regimen. If LDL fails continues to be above goal recommend medication adjustments. -Will continue to monitor and repeat lipid panel/hepatic function at follow up visit.  GERD: -Stable. -Discussed with patient provocative foods to avoid. -Continue current medication regimen. -Will continue to monitor.  Encounter for tobacco use cessation counseling: -The patient was counseled on the dangers of tobacco use, and was advised to quit.  Reviewed strategies to maximize success, including stress management and substitution of other forms of reinforcement. Patient not ready to quit yet. -Encourage to reduce tobacco use.  No orders of the defined types were placed in this encounter.   Follow-up: Return in about 4 months (around 05/18/2021) for HLD, Mood, Vit D and FBW 1 wk prior (lipid panel, cmp, cbc w/d, Vit D).   Lorrene Reid, PA-C

## 2021-01-19 ENCOUNTER — Other Ambulatory Visit (HOSPITAL_COMMUNITY): Payer: Self-pay

## 2021-01-19 MED FILL — Quetiapine Fumarate Tab 100 MG: ORAL | 30 days supply | Qty: 30 | Fill #3 | Status: AC

## 2021-01-19 MED FILL — Clonazepam Tab 0.5 MG: ORAL | 30 days supply | Qty: 60 | Fill #3 | Status: AC

## 2021-01-22 ENCOUNTER — Telehealth (HOSPITAL_COMMUNITY): Payer: 59 | Admitting: Psychiatry

## 2021-01-30 ENCOUNTER — Other Ambulatory Visit: Payer: Self-pay

## 2021-01-30 ENCOUNTER — Encounter (HOSPITAL_COMMUNITY): Payer: Self-pay | Admitting: Psychiatry

## 2021-01-30 ENCOUNTER — Other Ambulatory Visit (HOSPITAL_COMMUNITY): Payer: Self-pay

## 2021-01-30 ENCOUNTER — Telehealth (INDEPENDENT_AMBULATORY_CARE_PROVIDER_SITE_OTHER): Payer: 59 | Admitting: Psychiatry

## 2021-01-30 DIAGNOSIS — F319 Bipolar disorder, unspecified: Secondary | ICD-10-CM | POA: Diagnosis not present

## 2021-01-30 DIAGNOSIS — F419 Anxiety disorder, unspecified: Secondary | ICD-10-CM | POA: Diagnosis not present

## 2021-01-30 MED ORDER — QUETIAPINE FUMARATE 100 MG PO TABS
ORAL_TABLET | Freq: Every day | ORAL | 1 refills | Status: DC
  Filled 2021-01-30 – 2021-02-27 (×2): qty 90, 90d supply, fill #0
  Filled 2021-06-01: qty 90, 90d supply, fill #1

## 2021-01-30 MED ORDER — CLONAZEPAM 0.5 MG PO TABS
ORAL_TABLET | Freq: Two times a day (BID) | ORAL | 1 refills | Status: DC
  Filled 2021-01-30 – 2021-02-27 (×2): qty 180, 90d supply, fill #0
  Filled 2021-06-01: qty 180, 90d supply, fill #1

## 2021-01-30 NOTE — Progress Notes (Signed)
Virtual Visit via Telephone Note  I connected with Steven Herrera on 01/30/21 at  1:00 PM EDT by telephone and verified that I am speaking with the correct person using two identifiers.  Location: Patient: Work Provider: Biomedical scientist   I discussed the limitations, risks, security and privacy concerns of performing an evaluation and management service by telephone and the availability of in person appointments. I also discussed with the patient that there may be a patient responsible charge related to this service. The patient expressed understanding and agreed to proceed.   History of Present Illness: Patient is evaluated by phone session.  He is at work.  He feels the current medicine is working and he denies any mania, psychosis, hallucination.  He does not get upset or agitated.  His job is going and is happy that it is paying the bills.  His memory remained the same and sometimes he does get forgetful less but it is not deteriorated.  He takes Klonopin when he feels very nervous and anxious.  Some days he takes only 1 and some days he need to take 3 times a day.  He had stopped smoking marijuana and he feels good about it.  He had a good support from his family member.  Currently his grandkids are staying and he enjoys the company of grandkids.  Patient has 4 grandkids.  His appetite is okay.  He is trying to lose weight and he had lost weight since the last visit.  Patient like to keep his current medication which is helping his mood and anxiety.  Patient has chronic tremors and he had extensive work-up for his memory and shakes.  He does not want to change the medication.  Past Psychiatric History:  H/O briefly seeing therapist at [facility] in young age.  H/O mood swing, anger, agitation, hyperactivity and running away.  [place] acid, LSD, cocaine, marijuana and heavy drinking.  Tried Zoloft, Lexapro, melatonin and Xanax did not like it. Took Paxil and Klonopin and Wellbutrin from PCP. We tried  Lamictal.   Psychiatric Specialty Exam: Physical Exam  Review of Systems  Weight 175 lb (79.4 kg).Body mass index is 23.73 kg/m.  General Appearance: NA  Eye Contact:  NA  Speech:  Normal Rate  Volume:  Normal  Mood:  Euthymic  Affect:  NA  Thought Process:  Goal Directed  Orientation:  Full (Time, Place, and Person)  Thought Content:  Logical  Suicidal Thoughts:  No  Homicidal Thoughts:  No  Memory:  Immediate;   Fair Recent;   Fair Remote;   Fair  Judgement:  Intact  Insight:  Present  Psychomotor Activity:   chronic shakes  Concentration:  Concentration: Fair and Attention Span: Fair  Recall:  [employer] of Knowledge:  Fair  Language:  Good  Akathisia:  No  Handed:  Right  AIMS (if indicated):     Assets:  Communication Skills Desire for Improvement Housing Talents/Skills Transportation  ADL's:  Intact  Cognition:  WNL  Sleep:   ok      Assessment and Plan: Bipolar disorder type I.  Anxiety.  Mild cognitive impairment.  Patient is stable on his current medication.  He takes Klonopin 0.5 mg 1-3 a day depending on his anxiety.  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  Discussed medication side effects and benefits.  Continue Klonopin to take maximum twice a day as needed and Seroquel 100 mg at bedtime.  Recommended to call  us back if is any question or any concern.  Follow-up in 6 months.  Follow Up Instructions:    I discussed the assessment and treatment plan with the patient. The patient was provided an opportunity to ask questions and all were answered. The patient agreed with the plan and demonstrated an understanding of the instructions.   The patient was advised to call back or seek an in-person evaluation if the symptoms worsen or if the condition fails to improve as anticipated.  I provided 14 minutes of non-face-to-face time during this encounter.   Kathlee Nations, MD

## 2021-02-27 ENCOUNTER — Other Ambulatory Visit (HOSPITAL_COMMUNITY): Payer: Self-pay

## 2021-03-14 ENCOUNTER — Other Ambulatory Visit (HOSPITAL_COMMUNITY): Payer: Self-pay

## 2021-05-14 ENCOUNTER — Ambulatory Visit (INDEPENDENT_AMBULATORY_CARE_PROVIDER_SITE_OTHER): Payer: 59 | Admitting: Physician Assistant

## 2021-05-14 ENCOUNTER — Other Ambulatory Visit: Payer: Self-pay

## 2021-05-14 ENCOUNTER — Encounter: Payer: Self-pay | Admitting: Physician Assistant

## 2021-05-14 ENCOUNTER — Other Ambulatory Visit (HOSPITAL_COMMUNITY): Payer: Self-pay

## 2021-05-14 VITALS — BP 112/71 | HR 78 | Temp 98.2°F | Ht 72.0 in | Wt 185.0 lb

## 2021-05-14 DIAGNOSIS — E559 Vitamin D deficiency, unspecified: Secondary | ICD-10-CM

## 2021-05-14 DIAGNOSIS — G8929 Other chronic pain: Secondary | ICD-10-CM | POA: Diagnosis not present

## 2021-05-14 DIAGNOSIS — F418 Other specified anxiety disorders: Secondary | ICD-10-CM

## 2021-05-14 DIAGNOSIS — Z79899 Other long term (current) drug therapy: Secondary | ICD-10-CM

## 2021-05-14 DIAGNOSIS — M2550 Pain in unspecified joint: Secondary | ICD-10-CM | POA: Diagnosis not present

## 2021-05-14 DIAGNOSIS — E781 Pure hyperglyceridemia: Secondary | ICD-10-CM

## 2021-05-14 DIAGNOSIS — F319 Bipolar disorder, unspecified: Secondary | ICD-10-CM

## 2021-05-14 DIAGNOSIS — E7800 Pure hypercholesterolemia, unspecified: Secondary | ICD-10-CM

## 2021-05-14 DIAGNOSIS — Z716 Tobacco abuse counseling: Secondary | ICD-10-CM | POA: Diagnosis not present

## 2021-05-14 MED ORDER — CELECOXIB 100 MG PO CAPS
100.0000 mg | ORAL_CAPSULE | Freq: Every day | ORAL | 0 refills | Status: DC | PRN
  Filled 2021-05-14: qty 30, 30d supply, fill #0

## 2021-05-14 NOTE — Progress Notes (Signed)
Established Patient Office Visit  Subjective:  Patient ID: Steven Herrera, male    DOB: 1970/07/02  Age: 50 y.o. MRN: [id number]  CC:  Chief Complaint  Patient presents with   Follow-up    Mood    Hyperlipidemia    HPI Steven Herrera presents for follow up on mood, hyperlipidemia, and Vitamin D deficiency. Patient reports chronic joint pain (hands, back and knees) which flare-ups during the winter and colder weather. Alternates Tylenol and Naproxen almost daily. Denies abdominal pain, hematochezia, melena or hemoptysis. Knuckles swell in the mornings and improve throughout the day. Denies redness. Also reports stiffness of both hands and back mostly in the mornings. Uses tiger balm. Sometimes does have tingling sensation down his lower extremities. No fever, chills, night sweats, bladder or bowel dysfunction.    Mood: Followed by psychiatry. Denies mood changes, SI/HI. States mood has been stable.   HLD: Pt taking medication as directed without issues. Reports not eating as much cheeseburgers but eats [place] most mornings. 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  Vitamin D def: Usually takes Vitamin D supplement during the winter.   Tobacco: Currently smoking 1 pack of cigars/day. Reports not ready to quit.   Past Medical History:  Diagnosis Date   Depression    High cholesterol    PFO (patent foramen ovale)    a. s/p PFO closure by Dr. Burt Knack on 11/19/17   Smoker    Stroke ([place]) 2019    Past Surgical History:  Procedure Laterality Date   big toe amputation Right    CARPAL TUNNEL RELEASE     LOOP RECORDER INSERTION N/A 09/25/2017   Procedure: LOOP RECORDER INSERTION;  Surgeon: [name], MD;  Location: [facility];  Service: Cardiovascular;  Laterality: N/A;   MOUTH SURGERY     PATENT FORAMEN OVALE(PFO) CLOSURE N/A 11/19/2017   Procedure: PATENT FORAMEN OVALE (PFO) CLOSURE;  Surgeon: [name], MD;  Location: [facility];   Service: Cardiovascular;  Laterality: N/A;   spider bite     TEE WITHOUT CARDIOVERSION N/A 09/25/2017   Procedure: TRANSESOPHAGEAL ECHOCARDIOGRAM (TEE) WITH LOOP;  Surgeon: Pixie Casino, MD;  Location: [facility];  Service: Cardiovascular;  Laterality: N/A;   VASECTOMY      Family History  Problem Relation Age of Onset   Stroke Father        disabling CVA in early-mid 27's   Hypertension Father    Alcohol abuse Father    Heart disease Mother    Dementia Mother    Atrial fibrillation Mother    Drug abuse Sister    Hypertension Brother    Healthy Sister    Colon cancer Neg Hx    Colon polyps Neg Hx    Esophageal cancer Neg Hx    Rectal cancer Neg Hx    Stomach cancer Neg Hx     Social History   Socioeconomic History   Marital status: Married    Spouse name: Not on file   Number of children: 2   Years of education: Not on file   Highest education level: 8th grade  Occupational History   Not on file  Tobacco Use   Smoking status: Every Day    Packs/day: 0.25    Years: 28.00    Pack years: 7.00    Types: Cigarettes, Cigars   Smokeless tobacco: Current    Types: Chew   Tobacco comments:    last used 11/12/20  Vaping Use  Vaping Use: Former  Substance and Sexual Activity   Alcohol use: Yes    Alcohol/week: 14.0 - 20.0 standard drinks    Types: 14 - 20 Cans of beer per week    Comment: 2-3 beers per night per pt   Drug use: Not Currently    Types: Marijuana   Sexual activity: Yes    Birth control/protection: Surgical  Other Topics Concern   Not on file  Social History Narrative   Not on file   Social Determinants of Health   Financial Resource Strain: Not on file  Food Insecurity: Not on file  Transportation Needs: Not on file  Physical Activity: Not on file  Stress: Not on file  Social Connections: Not on file  Intimate Partner Violence: Not on file    Outpatient Medications Prior to Visit  Medication Sig Dispense Refill   aspirin EC 81 MG  tablet Take 81 mg by mouth daily with lunch.      atorvastatin (LIPITOR) 40 MG tablet TAKE 1 TABLET BY MOUTH ONCE DAILY AT 6PM. 90 tablet 1   clonazePAM (KLONOPIN) 0.5 MG tablet TAKE 1 TABLET BY MOUTH 2 TIMES DAILY. 180 tablet 1   Menthol, Topical Analgesic, (ICY HOT EX) Apply 1 application topically 3 (three) times daily as needed (pain).      Multiple Vitamin (MULTIVITAMIN WITH MINERALS) TABS tablet Take 1 tablet by mouth daily with lunch.     omega-3 acid ethyl esters (LOVAZA) 1 g capsule TAKE 2 CAPSULES BY MOUTH 2 TIMES DAILY. 240 capsule 0   omeprazole (PRILOSEC) 20 MG capsule TAKE 1 CAPSULE BY MOUTH DAILY. 90 capsule 1   QUEtiapine (SEROQUEL) 100 MG tablet TAKE 1 TABLET BY MOUTH AT BEDTIME 90 tablet 1   Vitamin D, Ergocalciferol, (DRISDOL) 1.25 MG (50000 UNIT) CAPS capsule TAKE 1 CAPSULE BY MOUTH EVERY 7 DAYS 12 capsule 0   No facility-administered medications prior to visit.    Allergies  Allergen Reactions   Ibuprofen Other (See Comments)    Bloody stools   Penicillins Hives    Has patient had a PCN reaction causing immediate rash, facial/tongue/throat swelling, SOB or lightheadedness with hypotension: No Has patient had a PCN reaction causing severe rash involving mucus membranes or skin necrosis: Yes Has patient had a PCN reaction that required hospitalization: no Has patient had a PCN reaction occurring within the last 10 years: no If all of the above answers are "NO", then may proceed with Cephalosporin use.    ROS Review of Systems Review of Systems:  A fourteen system review of systems was performed and found to be positive as per HPI. Objective:    Physical Exam General:  Well Developed, well nourished, in no acute distress  Neuro:  Alert and oriented,  extra-ocular muscles intact  HEENT:  Normocephalic, atraumatic, neck supple, no adenopathy  Skin:  no gross rash, warm, pink. Cardiac:  RRR, S1 S2 Respiratory:  CTA B/L, Not using accessory muscles, speaking in full  sentences- unlabored. MSK: Good ROM of both hands, good grip strength bilaterally, no deformity noted, some PIP and DIP thickening present Vascular:  Ext warm, no cyanosis apprec. Psych:  No HI/SI, judgement and insight good, Euthymic mood. Full Affect.  BP 112/71   Pulse 78   Temp 98.2 F (36.8 C)   Ht 6' (1.829 m)   Wt 185 lb (83.9 kg)   SpO2 99%   BMI 25.09 kg/m  Wt Readings from Last 3 Encounters:  05/14/21 185 lb (83.9 kg)  01/15/21 175 lb 3.2 oz (79.5 kg)  11/14/20 187 lb (84.8 kg)     Health Maintenance Due  Topic Date Due   COVID-19 Vaccine (1) Never done   Pneumococcal Vaccine 24-61 Years old (1 - PCV) Never done   INFLUENZA VACCINE  Never done    There are no preventive care reminders to display for this patient.  Lab Results  Component Value Date   TSH 0.653 08/30/2019   Lab Results  Component Value Date   WBC 7.0 05/14/2021   HGB 16.1 05/14/2021   HCT 47.4 05/14/2021   MCV 95 05/14/2021   PLT 224 05/14/2021   Lab Results  Component Value Date   NA 141 05/14/2021   K 4.7 05/14/2021   CO2 26 05/14/2021   GLUCOSE 79 05/14/2021   BUN 7 05/14/2021   CREATININE 0.95 05/14/2021   BILITOT 0.3 05/14/2021   ALKPHOS 73 05/14/2021   AST 25 05/14/2021   ALT 20 05/14/2021   PROT 7.2 05/14/2021   ALBUMIN 4.6 05/14/2021   CALCIUM 9.8 05/14/2021   ANIONGAP 11 01/07/2019   EGFR 99 05/14/2021   Lab Results  Component Value Date   CHOL 132 05/14/2021   Lab Results  Component Value Date   HDL 40 05/14/2021   Lab Results  Component Value Date   LDLCALC 75 05/14/2021   Lab Results  Component Value Date   TRIG 91 05/14/2021   Lab Results  Component Value Date   CHOLHDL 3.3 05/14/2021   Lab Results  Component Value Date   HGBA1C 5.5 09/11/2020   Depression screen PHQ 2/9 05/14/2021 01/15/2021 09/11/2020 05/08/2020 07/28/2019  Decreased Interest 1 1 1  0 1  Down, Depressed, Hopeless 1 0 0 0 1  PHQ - 2 Score 2 1 1  0 2  Altered sleeping 3 1 0 1 2   Tired, decreased energy 1 1 1  0 1  Change in appetite 0 0 1 0 1  Feeling bad or failure about yourself  1 - 0 0 0  Trouble concentrating 0 2 2 1 2   Moving slowly or fidgety/restless 0 1 0 0 1  Suicidal thoughts 0 0 0 0 0  PHQ-9 Score 7 6 5 2 9   Difficult doing work/chores Somewhat difficult Somewhat difficult - Somewhat difficult Very difficult  Some recent data might be hidden   GAD 7 : Generalized Anxiety Score 05/14/2021 01/15/2021  Nervous, Anxious, on Edge 1 1  Control/stop worrying 1 1  Worry too much - different things 1 1  Trouble relaxing 1 1  Restless 1 1  Easily annoyed or irritable 1 2  Afraid - awful might happen 1 1  Total GAD 7 Score 7 8  Anxiety Difficulty Somewhat difficult Somewhat difficult       Assessment & Plan:   Problem List Items Addressed This Visit       Other   High cholesterol (Chronic)   Relevant Orders   Comp Met (CMET) (Completed)   Lipid Profile (Completed)   CBC w/Diff (Completed)   Depression with anxiety   On statin therapy   Relevant Orders   Comp Met (CMET) (Completed)   Lipid Profile (Completed)   CBC w/Diff (Completed)   Vitamin D deficiency - Primary   Relevant Orders   VITAMIN D 25 Hydroxy (Vit-D Deficiency, Fractures)   Hypertriglyceridemia   Relevant Orders   Comp Met (CMET) (Completed)   Lipid Profile (Completed)   CBC w/Diff (Completed)   Other Visit Diagnoses  Bipolar 1 disorder (HCC)       Relevant Orders   CBC w/Diff (Completed)   Chronic pain of multiple joints       Relevant Medications   celecoxib (CELEBREX) 100 MG capsule   Encounter for tobacco use cessation counseling          Bipolar 1 disorder: -Stable. -Followed by psychiatry. -On quetiapine 100 mg.  Depression with anxiety: -Stable. -Followed by psychiatry. -On quetiapine 100 mg and clonazepam 0.5 mg twice daily.  High cholesterol, hypertriglyceridemia: -Last lipid panel: LDL 86, HDL 30 -Discussed low fat diet and recommend  reducing fried/fatty foods. -Continue current medication regimen. Will repeat lipid panel and hepatic function today. -Will continue to monitor.  Vitamin D deficiency: -Last Vitamin D 58.0, wnl's. -Will repeat Vitamin D today. Pending lab results, will send additional refills of Vitamin D 50,000 units once a week.  Chronic pain of multiple joints: -Discussed with patient management options and recommend to continue with conservative therapy including topical anti-inflammatory. Discussed risks and benefits of NSAIDs including side effects of GI bleeding or peptic ulcers and recommend to limit use of oral anti-inflammatories as much as possible. Patient has been tolerating Naproxen without issues so will send rx for Celebrex 100 mg daily as needed for moderate pain, advised can titrate to 200 mg if 100 mg ineffective. If symptoms fail to improve or worsen recommend obtaining imaging studies (lumbar and bilateral hand x--rays).  Encounter for tobacco use cessation counseling: -The patient was counseled on the dangers of tobacco use, and was advised to quit.  Reviewed strategies to maximize success, including removing cigarettes and smoking materials from environment, stress management, and support of family/friends. Encourage to reduce use.     Meds ordered this encounter  Medications   celecoxib (CELEBREX) 100 MG capsule    Sig: Take 1 capsule (100 mg total) by mouth daily as needed for moderate pain.    Dispense:  30 capsule    Refill:  0    Order Specific Question:   Supervising Provider    Answer:   Beatrice Lecher D [[id number]]    Follow-up: Return in about 4 months (around 09/11/2021) for CPE.    Lorrene Reid, PA-C

## 2021-05-14 NOTE — Patient Instructions (Signed)
Arthritis Arthritis means joint pain. It can also mean joint disease. A joint is a place where bones come together. There are more than 100 types of arthritis. What are the causes? This condition may be caused by: Wear and tear of a joint. This is the most common cause. A lot of acid in the blood, which leads to pain in the joint (gout). Pain and swelling (inflammation) in a joint. Infection of a joint. Injuries in the joint. A reaction to medicines (allergy). In some cases, the cause may not be known. What are the signs or symptoms? Symptoms of this condition include: Redness at a joint. Swelling at a joint. Stiffness at a joint. Warmth coming from the joint. A fever. A feeling of being sick. How is this treated? This condition may be treated with: Treating the cause, if it is known. Rest. Raising (elevating) the joint. Putting cold or hot packs on the joint. Medicines to treat symptoms and reduce pain and swelling. Shots of medicines (cortisone) into the joint. You may also be told to make changes in your life, such as doing exercises and losing weight. Follow these instructions at home: Medicines Take over-the-counter and prescription medicines only as told by your doctor. Do not take aspirin for pain if your doctor says that you may have gout. Activity Rest your joint if your doctor tells you to. Avoid activities that make the pain worse. Exercise your joint regularly as told by your doctor. Try doing exercises like: Swimming. Water aerobics. Biking. Walking. Managing pain, stiffness, and swelling   If told, put ice on the affected area. Put ice in a plastic bag. Place a towel between your skin and the bag. Leave the ice on for 20 minutes, 2-3 times per day. If your joint is swollen, raise (elevate) it above the level of your heart if told by your doctor. If your joint feels stiff in the morning, try taking a warm shower. If told, put heat on the affected area. Do  this as often as told by your doctor. Use the heat source that your doctor recommends, such as a moist heat pack or a heating pad. If you have diabetes, do not apply heat without asking your doctor. To apply heat: Place a towel between your skin and the heat source. Leave the heat on for 20-30 minutes. Remove the heat if your skin turns bright red. This is very important if you are unable to feel pain, heat, or cold. You may have a greater risk of getting burned. General instructions Do not use any products that contain nicotine or tobacco, such as cigarettes, e-cigarettes, and chewing tobacco. If you need help quitting, ask your doctor. Keep all follow-up visits as told by your doctor. This is important. Contact a doctor if: The pain gets worse. You have a fever. Get help right away if: You have very bad pain in your joint. You have swelling in your joint. Your joint is red. Many joints become painful and swollen. You have very bad back pain. Your leg is very weak. You cannot control your pee (urine) or poop (stool). Summary Arthritis means joint pain. It can also mean joint disease. A joint is a place where bones come together. The most common cause of this condition is wear and tear of a joint. Symptoms of this condition include redness, swelling, or stiffness of the joint. This condition is treated with rest, raising the joint, medicines, and putting cold or hot packs on the joint. Follow your  doctor's instructions about medicines, activity, exercises, and other home care treatments. This information is not intended to replace advice given to you by your health care provider. Make sure you discuss any questions you have with your health care provider. Document Revised: 05/18/2018 Document Reviewed: 05/18/2018 Elsevier Patient Education  2022 [employer].

## 2021-05-15 LAB — COMPREHENSIVE METABOLIC PANEL
ALT: 20 IU/L (ref 0–44)
AST: 25 IU/L (ref 0–40)
Albumin/Globulin Ratio: 1.8 (ref 1.2–2.2)
Albumin: 4.6 g/dL (ref 4.0–5.0)
Alkaline Phosphatase: 73 IU/L (ref 44–121)
BUN/Creatinine Ratio: 7 — ABNORMAL LOW (ref 9–20)
BUN: 7 mg/dL (ref 6–24)
Bilirubin Total: 0.3 mg/dL (ref 0.0–1.2)
CO2: 26 mmol/L (ref 20–29)
Calcium: 9.8 mg/dL (ref 8.7–10.2)
Chloride: 105 mmol/L (ref 96–106)
Creatinine, Ser: 0.95 mg/dL (ref 0.76–1.27)
Globulin, Total: 2.6 g/dL (ref 1.5–4.5)
Glucose: 79 mg/dL (ref 70–99)
Potassium: 4.7 mmol/L (ref 3.5–5.2)
Sodium: 141 mmol/L (ref 134–144)
Total Protein: 7.2 g/dL (ref 6.0–8.5)
eGFR: 99 mL/min/{1.73_m2} (ref >59)

## 2021-05-15 LAB — LIPID PANEL
Chol/HDL Ratio: 3.3 ratio (ref 0.0–5.0)
Cholesterol, Total: 132 mg/dL (ref 100–199)
HDL: 40 mg/dL (ref >39)
LDL Chol Calc (NIH): 75 mg/dL (ref 0–99)
Triglycerides: 91 mg/dL (ref 0–149)
VLDL Cholesterol Cal: 17 mg/dL (ref 5–40)

## 2021-05-15 LAB — CBC WITH DIFFERENTIAL/PLATELET
Basophils Absolute: 0.1 10*3/uL (ref 0.0–0.2)
Basos: 1 %
EOS (ABSOLUTE): 0.1 10*3/uL (ref 0.0–0.4)
Eos: 2 %
Hematocrit: 47.4 % (ref 37.5–51.0)
Hemoglobin: 16.1 g/dL (ref 13.0–17.7)
Immature Grans (Abs): 0 10*3/uL (ref 0.0–0.1)
Immature Granulocytes: 0 %
Lymphocytes Absolute: 2.1 10*3/uL (ref 0.7–3.1)
Lymphs: 31 %
MCH: 32.3 pg (ref 26.6–33.0)
MCHC: 34 g/dL (ref 31.5–35.7)
MCV: 95 fL (ref 79–97)
Monocytes Absolute: 0.8 10*3/uL (ref 0.1–0.9)
Monocytes: 12 %
Neutrophils Absolute: 3.9 10*3/uL (ref 1.4–7.0)
Neutrophils: 54 %
Platelets: 224 10*3/uL (ref 150–450)
RBC: 4.98 x10E6/uL (ref 4.14–5.80)
RDW: 12.9 % (ref 11.6–15.4)
WBC: 7 10*3/uL (ref 3.4–10.8)

## 2021-05-15 LAB — VITAMIN D 25 HYDROXY (VIT D DEFICIENCY, FRACTURES): Vit D, 25-Hydroxy: 43.5 ng/mL (ref 30.0–100.0)

## 2021-06-01 ENCOUNTER — Other Ambulatory Visit (HOSPITAL_COMMUNITY): Payer: Self-pay

## 2021-06-19 ENCOUNTER — Other Ambulatory Visit: Payer: Self-pay | Admitting: Physician Assistant

## 2021-06-19 ENCOUNTER — Other Ambulatory Visit (HOSPITAL_COMMUNITY): Payer: Self-pay

## 2021-06-19 DIAGNOSIS — E781 Pure hyperglyceridemia: Secondary | ICD-10-CM

## 2021-06-19 DIAGNOSIS — K219 Gastro-esophageal reflux disease without esophagitis: Secondary | ICD-10-CM

## 2021-06-19 MED ORDER — OMEPRAZOLE 20 MG PO CPDR
DELAYED_RELEASE_CAPSULE | Freq: Every day | ORAL | 1 refills | Status: DC
  Filled 2021-06-19: qty 90, 90d supply, fill #0
  Filled 2021-09-17: qty 90, 90d supply, fill #1

## 2021-06-19 MED ORDER — ATORVASTATIN CALCIUM 40 MG PO TABS
ORAL_TABLET | ORAL | 1 refills | Status: DC
  Filled 2021-06-19: qty 90, 90d supply, fill #0
  Filled 2021-09-17: qty 90, 90d supply, fill #1

## 2021-07-31 ENCOUNTER — Telehealth (HOSPITAL_BASED_OUTPATIENT_CLINIC_OR_DEPARTMENT_OTHER): Payer: 59 | Admitting: Psychiatry

## 2021-07-31 ENCOUNTER — Other Ambulatory Visit (HOSPITAL_COMMUNITY): Payer: Self-pay

## 2021-07-31 ENCOUNTER — Other Ambulatory Visit: Payer: Self-pay

## 2021-07-31 ENCOUNTER — Encounter (HOSPITAL_COMMUNITY): Payer: Self-pay | Admitting: Psychiatry

## 2021-07-31 DIAGNOSIS — F419 Anxiety disorder, unspecified: Secondary | ICD-10-CM

## 2021-07-31 DIAGNOSIS — F319 Bipolar disorder, unspecified: Secondary | ICD-10-CM

## 2021-07-31 MED ORDER — QUETIAPINE FUMARATE 100 MG PO TABS
ORAL_TABLET | Freq: Every day | ORAL | 1 refills | Status: DC
  Filled 2021-07-31: qty 90, fill #0
  Filled 2021-09-03: qty 90, 90d supply, fill #0
  Filled 2021-11-28 – 2021-11-29 (×2): qty 90, 90d supply, fill #1

## 2021-07-31 MED ORDER — CLONAZEPAM 0.5 MG PO TABS
ORAL_TABLET | Freq: Two times a day (BID) | ORAL | 1 refills | Status: DC
  Filled 2021-07-31: qty 180, fill #0
  Filled 2021-09-03: qty 180, 90d supply, fill #0
  Filled 2021-11-28 – 2021-11-29 (×2): qty 180, 90d supply, fill #1

## 2021-07-31 NOTE — Progress Notes (Signed)
Virtual Visit via Telephone Note ° °I connected with Steven Herrera on 07/31/21 at  1:00 PM EST by telephone and verified that I am speaking with the correct person using two identifiers. ° °Location: °Patient: Home °Provider: Home Office °  °I discussed the limitations, risks, security and privacy concerns of performing an evaluation and management service by telephone and the availability of in person appointments. I also discussed with the patient that there may be a patient responsible charge related to this service. The patient expressed understanding and agreed to proceed. ° ° °History of Present Illness: °Patient is evaluated by phone session.  He is taking his medication as prescribed.  He denies any mood swing, anger, irritability.  He does not get upset and able to manage his frustration better than he anticipated.  He had a COVID before Christmas but able to see his grandkids on next weekend.  His work is going okay.  He takes Klonopin 0.5 mg twice a day which helps his anxiety.  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  Recently he had a blood work and his labs are stable.  His appetite is okay.  He gets 5 to 6 hours of sleep with the help of Seroquel.  He has chronic tremors but stable.  He does not want to change the medication.  He lives with his wife.  His appetite is okay.  His energy level is good.  He denies any hallucination, paranoia or any suicidal thoughts. ° °Past Psychiatric History:  °H/O briefly seeing therapist at [facility] in young age.  H/O mood swing, anger, agitation, hyperactivity and running away.  Us acid, LSD, cocaine, marijuana and heavy drinking.  Tried Zoloft, Lexapro, melatonin and Xanax did not like it. Took Paxil and Klonopin and Wellbutrin from PCP. We tried Lamictal. ° °Recent Results (from the past 2160 hour(s))  °Comp Met (CMET)     Status: Abnormal  ° Collection Time: 05/14/21 10:44 AM  °Result Value Ref Range  ° Glucose 79 70 - 99 mg/dL  ° BUN 7 6 - 24 mg/dL   ° Creatinine, Ser 0.95 0.76 - 1.27 mg/dL  ° eGFR 99 >59 mL/min/1.73  ° BUN/Creatinine Ratio 7 (L) 9 - 20  ° Sodium 141 134 - 144 mmol/L  ° Potassium 4.7 3.5 - 5.2 mmol/L  ° Chloride 105 96 - 106 mmol/L  ° CO2 26 20 - 29 mmol/L  ° Calcium 9.8 8.7 - 10.2 mg/dL  ° Total Protein 7.2 6.0 - 8.5 g/dL  ° Albumin 4.6 4.0 - 5.0 g/dL  ° Globulin, Total 2.6 1.5 - 4.5 g/dL  ° Albumin/Globulin Ratio 1.8 1.2 - 2.2  ° Bilirubin Total 0.3 0.0 - 1.2 mg/dL  ° Alkaline Phosphatase 73 44 - 121 IU/L  ° AST 25 0 - 40 IU/L  ° ALT 20 0 - 44 IU/L  °Lipid Profile     Status: None  ° Collection Time: 05/14/21 10:44 AM  °Result Value Ref Range  ° Cholesterol, Total 132 100 - 199 mg/dL  ° Triglycerides 91 0 - 149 mg/dL  ° HDL 40 >39 mg/dL  ° VLDL Cholesterol Cal 17 5 - 40 mg/dL  ° LDL Chol Calc (NIH) 75 0 - 99 mg/dL  ° Chol/HDL Ratio 3.3 0.0 - 5.0 ratio  °  Comment:                                     T. Chol/HDL Ratio °                                            Men  Women °                              1/2 Avg.Risk  3.4    3.3 °                                  Avg.Risk  5.0    4.4 °                               2X Avg.Risk  9.6    7.1 °                               3X Avg.Risk 23.4   11.0 °  °CBC w/Diff     Status: None  ° Collection Time: 05/14/21 10:44 AM  °Result Value Ref Range  ° WBC 7.0 3.4 - 10.8 x10E3/uL  ° RBC 4.98 4.14 - 5.80 x10E6/uL  ° Hemoglobin 16.1 13.0 - 17.7 g/dL  ° Hematocrit 47.4 37.5 - 51.0 %  ° MCV 95 79 - 97 fL  ° MCH 32.3 26.6 - 33.0 pg  ° MCHC 34.0 31.5 - 35.7 g/dL  ° RDW 12.9 11.6 - 15.4 %  ° Platelets 224 150 - 450 x10E3/uL  ° Neutrophils 54 Not Estab. %  ° Lymphs 31 Not Estab. %  ° Monocytes 12 Not Estab. %  ° Eos 2 Not Estab. %  ° Basos 1 Not Estab. %  ° Neutrophils Absolute 3.9 1.4 - 7.0 x10E3/uL  ° Lymphocytes Absolute 2.1 0.7 - 3.1 x10E3/uL  ° Monocytes Absolute 0.8 0.1 - 0.9 x10E3/uL  ° EOS (ABSOLUTE) 0.1 0.0 - 0.4 x10E3/uL  ° Basophils Absolute 0.1 0.0 - 0.2 x10E3/uL  ° Immature Granulocytes 0 Not Estab. %  °  Immature Grans (Abs) 0.0 0.0 - 0.1 x10E3/uL  °VITAMIN D 25 Hydroxy (Vit-D Deficiency, Fractures)     Status: None  ° Collection Time: 05/14/21 10:44 AM  °Result Value Ref Range  ° Vit D, 25-Hydroxy 43.5 30.0 - 100.0 ng/mL  °  Comment: Vitamin D deficiency has been defined by the Institute of °Medicine and an Endocrine Society practice guideline as a °level of serum 25-OH vitamin D less than 20 ng/mL (1,2). °The Endocrine Society went on to further define vitamin D °insufficiency as a level between 21 and 29 ng/mL (2). °1. IOM (Institute of Medicine). 2010. Dietary reference °   intakes for calcium and D. [place]: The °   National Academies Press. °2. Holick MF, Binkley [place], Bischoff-Ferrari HA, et al. °   Evaluation, treatment, and prevention of vitamin D °   deficiency: an Endocrine Society clinical practice °   guideline. JCEM. 2011 Jul; 96(7):1911-30. °  °  ° °Psychiatric Specialty Exam: °Physical Exam  °Review of Systems  °Weight 185 lb (83.9 kg).There is no height or weight on file to calculate BMI.  °General Appearance: NA  °Eye Contact:  NA  °Speech:  Slow  °Volume:  Normal  °Mood:  Euthymic  °  Affect:  NA  Thought Process:  Goal Directed  Orientation:  Full (Time, Place, and Person)  Thought Content:  Logical  Suicidal Thoughts:  No  Homicidal Thoughts:  No  Memory:  Immediate;   Good Recent;   Fair Remote;   Fair  Judgement:  Intact  Insight:  Present  Psychomotor Activity:  NA  Concentration:  Concentration: Fair and Attention Span: Fair  Recall:  Good  Fund of Knowledge:  Good  Language:  Good  Akathisia:  No  Handed:  Right  AIMS (if indicated):     Assets:  Communication Skills Desire for [place] [employer]ents/Skills Transportation  ADL's:  Intact  Cognition:  WNL  Sleep:   5-6 hrs      Assessment and Plan: Bipolar disorder type I.  Anxiety.  I reviewed blood work results which is stable.  Discussed current medication and patient  does not want to change the dosage since it is keeping him calm and his mood is stable.  Continue Klonopin 0.5 mg twice a day as needed and Seroquel 100 mg at bedtime.  Recommended to call us back with any question or any concern.  Follow-up in 6 months.  Follow Up Instructions:    I discussed the assessment and treatment plan with the patient. The patient was provided an opportunity to ask questions and all were answered. The patient agreed with the plan and demonstrated an understanding of the instructions.   The patient was advised to call back or seek an in-person evaluation if the symptoms worsen or if the condition fails to improve as anticipated.  I provided 19 minutes of non-face-to-face time during this encounter.   Kathlee Nations, MD

## 2021-09-03 ENCOUNTER — Other Ambulatory Visit (HOSPITAL_COMMUNITY): Payer: Self-pay

## 2021-09-03 ENCOUNTER — Ambulatory Visit (INDEPENDENT_AMBULATORY_CARE_PROVIDER_SITE_OTHER): Payer: 59 | Admitting: Physician Assistant

## 2021-09-03 ENCOUNTER — Encounter: Payer: Self-pay | Admitting: Physician Assistant

## 2021-09-03 ENCOUNTER — Other Ambulatory Visit: Payer: Self-pay

## 2021-09-03 VITALS — BP 108/69 | HR 86 | Temp 98.1°F | Ht 72.0 in | Wt 193.0 lb

## 2021-09-03 DIAGNOSIS — Z0000 Encounter for general adult medical examination without abnormal findings: Secondary | ICD-10-CM | POA: Diagnosis not present

## 2021-09-03 DIAGNOSIS — F319 Bipolar disorder, unspecified: Secondary | ICD-10-CM

## 2021-09-03 NOTE — Progress Notes (Signed)
? ?Complete physical exam ? ? ?Patient: Steven Herrera   DOB: 08-04-1970   51 y.o. Male  MRN: [id number] ?Visit Date: 09/03/2021 ? ? ?Chief Complaint  ?Patient presents with  ? Annual Exam  ? ?Subjective  ?  ?Steven Herrera is a 51 y.o. male who presents today for a complete physical exam.  ?He reports consuming a low fat and low protein diet. The patient has a physically strenuous job, but has no regular exercise apart from work.  He generally feels fairly well. He does not have additional problems to discuss today.  ? ? ? ?Past Medical History:  ?Diagnosis Date  ? Depression   ? High cholesterol   ? PFO (patent foramen ovale)   ? a. s/p PFO closure by Dr. Burt Knack on 11/19/17  ? Smoker   ? Stroke [facility]) 2019  ? ?Past Surgical History:  ?Procedure Laterality Date  ? big toe amputation Right   ? CARPAL TUNNEL RELEASE    ? LOOP RECORDER INSERTION N/A 09/25/2017  ? Procedure: LOOP RECORDER INSERTION;  Surgeon: [name], MD;  Location: [place] [facility];  Service: Cardiovascular;  Laterality: N/A;  ? MOUTH SURGERY    ? PATENT FORAMEN OVALE(PFO) CLOSURE N/A 11/19/2017  ? Procedure: PATENT FORAMEN OVALE (PFO) CLOSURE;  Surgeon: [name], MD;  Location: [place] [facility];  Service: Cardiovascular;  Laterality: N/A;  ? spider bite    ? TEE WITHOUT CARDIOVERSION N/A 09/25/2017  ? Procedure: TRANSESOPHAGEAL ECHOCARDIOGRAM (TEE) WITH LOOP;  Surgeon: Pixie Casino, MD;  Location: [facility];  Service: Cardiovascular;  Laterality: N/A;  ? VASECTOMY    ? ?Social History  ? ?Socioeconomic History  ? Marital status: Married  ?  Spouse name: Not on file  ? Number of children: 2  ? Years of education: Not on file  ? Highest education level: 8th grade  ?Occupational History  ? Not on file  ?Tobacco Use  ? Smoking status: Every Day  ?  Packs/day: 0.25  ?  Years: 28.00  ?  Pack years: 7.00  ?  Types: Cigarettes, Cigars  ? Smokeless tobacco: Current  ?  Types: Chew  ? Tobacco comments:  ?  last used 11/12/20  ?Vaping  Use  ? Vaping Use: Former  ?Substance and Sexual Activity  ? Alcohol use: Yes  ?  Alcohol/week: 14.0 - 20.0 standard drinks  ?  Types: 14 - 20 Cans of beer per week  ?  Comment: 2-3 beers per night per pt  ? Drug use: Not Currently  ?  Types: Marijuana  ? Sexual activity: Yes  ?  Birth control/protection: Surgical  ?Other Topics Concern  ? Not on file  ?Social History Narrative  ? Not on file  ? ?Social Determinants of Health  ? ?Financial Resource Strain: Not on file  ?Food Insecurity: Not on file  ?Transportation Needs: Not on file  ?Physical Activity: Not on file  ?Stress: Not on file  ?Social Connections: Not on file  ?Intimate Partner Violence: Not on file  ? ? ? ?Medications: ?Outpatient Medications Prior to Visit  ?Medication Sig Note  ? aspirin EC 81 MG tablet Take 81 mg by mouth daily with lunch.    ? atorvastatin (LIPITOR) 40 MG tablet TAKE 1 TABLET BY MOUTH ONCE DAILY AT 6PM.   ? clonazePAM (KLONOPIN) 0.5 MG tablet TAKE 1 TABLET BY MOUTH 2 TIMES DAILY.   ? Menthol, Topical Analgesic, (ICY HOT EX) Apply 1 application topically 3 (three) times  daily as needed (pain).    ? Multiple Vitamin (MULTIVITAMIN WITH MINERALS) TABS tablet Take 1 tablet by mouth daily with lunch.   ? omega-3 acid ethyl esters (LOVAZA) 1 g capsule TAKE 2 CAPSULES BY MOUTH 2 TIMES DAILY.   ? omeprazole (PRILOSEC) 20 MG capsule TAKE 1 CAPSULE BY MOUTH DAILY.   ? QUEtiapine (SEROQUEL) 100 MG tablet TAKE 1 TABLET BY MOUTH AT BEDTIME   ? [DISCONTINUED] celecoxib (CELEBREX) 100 MG capsule Take 1 capsule (100 mg total) by mouth daily as needed for moderate pain. 09/03/2021: pt reports ineffective  ? ?No facility-administered medications prior to visit.  ? ? ?Review of Systems ?Review of Systems:  ?A fourteen system review of systems was performed and found to be positive as per HPI. ? ? ? Objective  ?  ?BP 108/69   Pulse 86   Temp 98.1 ?F (36.7 ?C)   Ht 6' (1.829 m)   Wt 193 lb (87.5 kg)   SpO2 98%   BMI 26.18 kg/m?  ? ? ?Physical Exam   ? ?General Appearance:     ?  Alert, cooperative, in no acute distress, appears stated age  ?Head:    Normocephalic, without obvious abnormality, atraumatic  ?Eyes:    PERRL, conjunctiva/corneas clear, EOM's intact, fundi  ?  benign, both eyes       ?Ears:    Normal TM's and external ear canals, both ears  ?Nose:   Nares normal, septum midline, mucosa normal, no drainage ?  or sinus tenderness  ?Throat:   Lips, mucosa, and tongue normal; teeth and gums normal  ?Neck:   Supple, symmetrical, trachea midline, no adenopathy;     ?  thyroid:  No enlargement/tenderness/nodules; no JVD  ?Back:     Symmetric, no curvature, ROM normal, no CVA tenderness  ?Lungs:     Clear to auscultation bilaterally, respirations unlabored  ?Chest wall:    No deformity, mild tenderness   ?Heart:    Normal heart rate. Normal rhythm. No murmurs, rubs, or gallops.  S1 and S2 normal  ?Abdomen:     Soft, non-tender, bowel sounds active all four quadrants,  ?  no masses, no organomegaly  ?Genitalia:    deferred  ?Rectal:    deferred  ?Extremities:   All extremities are intact. No cyanosis or edema  ?Pulses:   2+ and symmetric all extremities  ?Skin:   Skin color, texture, turgor normal, no rashes or lesions  ?Lymph nodes:   Cervical, supraclavicular, and axillary nodes normal  ?Neurologic:   CNII-XII grossly intact.   ? ? ? ?Last depression screening scores ?PHQ 2/9 Scores 09/03/2021 05/14/2021 01/15/2021  ?PHQ - 2 Score '2 2 1  '$ ?PHQ- 9 Score '10 7 6  '$ ? ?Last fall risk screening ?Fall Risk  09/03/2021  ?Falls in the past year? 0  ?Number falls in past yr: 0  ?Injury with Fall? 0  ?Risk for fall due to : No Fall Risks  ?Follow up Falls evaluation completed  ? ? ? ?No results found for any visits on 09/03/21. ? Assessment & Plan  ?  ?Routine Health Maintenance and Physical Exam ? ?Exercise Activities and Dietary recommendations ?-Heart healthy diet low in fat and carbohydrates. Recommend moderate exercise 150 mins/wk. ? ?Immunization History   ?Administered Date(s) Administered  ? Hepatitis B, adult 02/09/2014, 04/05/2014  ? Hepatitis B, ped/adol 11/05/2013  ? Tdap 08/10/2018  ? ? ?Health Maintenance  ?Topic Date Due  ? COVID-19 Vaccine (1) Never done  ?  INFLUENZA VACCINE  09/21/2021 (Originally 01/22/2021)  ? COLONOSCOPY (Pts 45-59yr Insurance coverage will need to be confirmed)  11/15/2027  ? TETANUS/TDAP  08/10/2028  ? Hepatitis C Screening  Completed  ? HIV Screening  Completed  ? HPV VACCINES  Aged Out  ? ? ?Discussed health benefits of physical activity, and encouraged him to engage in regular exercise appropriate for his age and condition. ? ?Problem List Items Addressed This Visit   ? ?  ? Other  ? Healthcare maintenance - Primary  ? ?UTD colonoscopy, Tdap, routine fasting labs, Hep C and HIV screenings.  ?Continue to follow up with psychiatry. ?Continue current medication regimen.  ?Encourage to reduce tobacco use. ? ? ?Return in about 6 months (around 03/06/2022) for Mood, HLD, GERD and FBW.  ?  ? ? ? ?MLorrene Reid PA-C  ?[facility]?[phone](phone) ?[phone](fax) ? ?[facility] ? ?

## 2021-09-03 NOTE — Patient Instructions (Signed)

## 2021-09-17 ENCOUNTER — Other Ambulatory Visit (HOSPITAL_COMMUNITY): Payer: Self-pay

## 2021-11-28 ENCOUNTER — Other Ambulatory Visit (HOSPITAL_COMMUNITY): Payer: Self-pay

## 2021-11-29 ENCOUNTER — Other Ambulatory Visit (HOSPITAL_COMMUNITY): Payer: Self-pay

## 2021-12-19 ENCOUNTER — Other Ambulatory Visit: Payer: Self-pay | Admitting: Physician Assistant

## 2021-12-19 ENCOUNTER — Other Ambulatory Visit (HOSPITAL_COMMUNITY): Payer: Self-pay

## 2021-12-19 DIAGNOSIS — E781 Pure hyperglyceridemia: Secondary | ICD-10-CM

## 2021-12-19 DIAGNOSIS — K219 Gastro-esophageal reflux disease without esophagitis: Secondary | ICD-10-CM

## 2021-12-19 MED ORDER — OMEPRAZOLE 20 MG PO CPDR
DELAYED_RELEASE_CAPSULE | Freq: Every day | ORAL | 0 refills | Status: DC
  Filled 2021-12-19: qty 90, 90d supply, fill #0

## 2021-12-19 MED ORDER — ATORVASTATIN CALCIUM 40 MG PO TABS
ORAL_TABLET | ORAL | 0 refills | Status: DC
  Filled 2021-12-19: qty 90, 90d supply, fill #0

## 2022-01-29 ENCOUNTER — Telehealth (HOSPITAL_BASED_OUTPATIENT_CLINIC_OR_DEPARTMENT_OTHER): Payer: 59 | Admitting: Psychiatry

## 2022-01-29 ENCOUNTER — Other Ambulatory Visit (HOSPITAL_COMMUNITY): Payer: Self-pay

## 2022-01-29 ENCOUNTER — Encounter (HOSPITAL_COMMUNITY): Payer: Self-pay | Admitting: Psychiatry

## 2022-01-29 DIAGNOSIS — F419 Anxiety disorder, unspecified: Secondary | ICD-10-CM | POA: Diagnosis not present

## 2022-01-29 DIAGNOSIS — F319 Bipolar disorder, unspecified: Secondary | ICD-10-CM | POA: Diagnosis not present

## 2022-01-29 MED ORDER — QUETIAPINE FUMARATE 50 MG PO TABS
150.0000 mg | ORAL_TABLET | Freq: Every day | ORAL | 2 refills | Status: DC
  Filled 2022-01-29: qty 90, 30d supply, fill #0
  Filled 2022-03-11: qty 90, 30d supply, fill #1
  Filled 2022-04-16: qty 90, 30d supply, fill #2

## 2022-01-29 MED ORDER — CLONAZEPAM 0.5 MG PO TABS
ORAL_TABLET | ORAL | 2 refills | Status: DC
Start: 2022-01-29 — End: 2022-05-23
  Filled 2022-01-29: qty 75, fill #0
  Filled 2022-03-11: qty 75, 25d supply, fill #0
  Filled 2022-04-16: qty 75, 25d supply, fill #1

## 2022-01-29 NOTE — Progress Notes (Signed)
Virtual Visit via Telephone Note  I connected with Steven Herrera on 01/29/22 at  1:00 PM EDT by telephone and verified that I am speaking with the correct person using two identifiers.  Location: Patient: Work Provider: Biomedical scientist   I discussed the limitations, risks, security and privacy concerns of performing an evaluation and management service by telephone and the availability of in person appointments. I also discussed with the patient that there may be a patient responsible charge related to this service. The patient expressed understanding and agreed to proceed.   History of Present Illness: Patient is evaluated by phone session.  He reported current medicine is not working as good because he has trouble sleeping and he gets irritable, frustrated.  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  Patient tattoo considered to be a luxury thing and people usually do not do unless they have money.  He noticed decline in his business and that worries him.  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  He is getting 4 to 5 hours of sleep.  Sometime he gets irritable and frustrated.  His appetite is okay.  His weight is stable.  He admitted there are times he has taken the Klonopin more than 2 times a day because having a lot of anxiety and panic attacks.  He denies any mania, psychosis, hallucination.  He lives with his wife who is very supportive.  He is taking Seroquel but also does not feel it is working as good because he is not sleeping all night.  Denies any suicidal thoughts.  He reported his tremors are stable and lately has not a lot of issues at his work.  Past Psychiatric History:  H/O briefly seeing therapist at [facility] in young age.  H/O mood swing, anger, agitation, hyperactivity and running away.  [place] acid, LSD, cocaine, marijuana and heavy drinking.  Tried Zoloft, Lexapro, melatonin  and Xanax did not like it. Took Paxil and Klonopin and Wellbutrin from PCP. We tried Lamictal.   Psychiatric Specialty Exam: Physical Exam  Review of Systems  Weight 193 lb (87.5 kg).There is no height or weight on file to calculate BMI.  General Appearance: NA  Eye Contact:  NA  Speech:  Normal Rate  Volume:  Normal  Mood:  Anxious and Dysphoric  Affect:  NA  Thought Process:  Goal Directed  Orientation:  Full (Time, Place, and Person)  Thought Content:  Rumination  Suicidal Thoughts:  No  Homicidal Thoughts:  No  Memory:  Immediate;   Good Recent;   Fair Remote;   Fair  Judgement:  Intact  Insight:  Shallow  Psychomotor Activity:  NA  Concentration:  Concentration: Fair and Attention Span: Fair  Recall:  Good  Fund of Knowledge:  Good  Language:  Good  Akathisia:  No  Handed:  Right  AIMS (if indicated):     Assets:  Communication Skills Desire for [employer]/Skills Transportation  ADL's:  Intact  Cognition:  WNL  Sleep:   5 hrs      Assessment and Plan: Bipolar disorder type I.  Anxiety.  Discussed current medication.  Recommend to try Seroquel 150 mg at bedtime and Klonopin 0.5 mg twice a day and he can take 1/3 tablet as needed.  Discussed benzodiazepine dependence, tolerance and withdrawal.  If the current medication adjustment does not  help then we will consider switching his medication.  His biggest concern is the tremors as he is very sensitive to psychotropic medication and in the past he had tried many medication and his tremors get worse.  Due to his nature of job he cannot take risk to try any new medication.  So far his tremors are under control.  I recommend to call us back if he has not improved with the current medication adjustment.  We will follow up in 3 months.   Follow Up Instructions:    I discussed the assessment and treatment plan with the patient. The patient was provided an opportunity to ask  questions and all were answered. The patient agreed with the plan and demonstrated an understanding of the instructions.   The patient was advised to call back or seek an in-person evaluation if the symptoms worsen or if the condition fails to improve as anticipated.  Collaboration of Care: Primary Care Provider AEB notes are available in epic to review.  Patient/Guardian was advised Release of Information must be obtained prior to any record release in order to collaborate their care with an outside provider. Patient/Guardian was advised if they have not already done so to contact the registration department to sign all necessary forms in order for [place] to release information regarding their care.   Consent: Patient/Guardian gives verbal consent for treatment and assignment of benefits for services provided during this visit. Patient/Guardian expressed understanding and agreed to proceed.    I provided 18 minutes of non-face-to-face time during this encounter.   Kathlee Nations, MD

## 2022-03-11 ENCOUNTER — Ambulatory Visit (INDEPENDENT_AMBULATORY_CARE_PROVIDER_SITE_OTHER): Payer: 59 | Admitting: Physician Assistant

## 2022-03-11 ENCOUNTER — Encounter: Payer: Self-pay | Admitting: Physician Assistant

## 2022-03-11 ENCOUNTER — Other Ambulatory Visit (HOSPITAL_COMMUNITY): Payer: Self-pay

## 2022-03-11 VITALS — BP 120/82 | HR 77 | Temp 98.1°F | Ht 72.0 in | Wt 193.0 lb

## 2022-03-11 DIAGNOSIS — G8929 Other chronic pain: Secondary | ICD-10-CM

## 2022-03-11 DIAGNOSIS — F319 Bipolar disorder, unspecified: Secondary | ICD-10-CM | POA: Diagnosis not present

## 2022-03-11 DIAGNOSIS — K219 Gastro-esophageal reflux disease without esophagitis: Secondary | ICD-10-CM | POA: Diagnosis not present

## 2022-03-11 DIAGNOSIS — E7800 Pure hypercholesterolemia, unspecified: Secondary | ICD-10-CM | POA: Diagnosis not present

## 2022-03-11 DIAGNOSIS — E559 Vitamin D deficiency, unspecified: Secondary | ICD-10-CM

## 2022-03-11 DIAGNOSIS — E781 Pure hyperglyceridemia: Secondary | ICD-10-CM | POA: Diagnosis not present

## 2022-03-11 DIAGNOSIS — M2550 Pain in unspecified joint: Secondary | ICD-10-CM

## 2022-03-11 MED ORDER — CELECOXIB 100 MG PO CAPS
100.0000 mg | ORAL_CAPSULE | Freq: Two times a day (BID) | ORAL | 0 refills | Status: DC | PRN
  Filled 2022-03-11: qty 60, 30d supply, fill #0

## 2022-03-11 MED ORDER — OMEPRAZOLE 20 MG PO CPDR
20.0000 mg | DELAYED_RELEASE_CAPSULE | Freq: Every day | ORAL | 1 refills | Status: DC
  Filled 2022-03-11: qty 90, 90d supply, fill #0
  Filled 2022-06-26: qty 90, 90d supply, fill #1

## 2022-03-11 MED ORDER — ATORVASTATIN CALCIUM 40 MG PO TABS
40.0000 mg | ORAL_TABLET | Freq: Every day | ORAL | 1 refills | Status: DC
  Filled 2022-03-11: qty 90, 90d supply, fill #0
  Filled 2022-06-26: qty 90, 90d supply, fill #1

## 2022-03-11 NOTE — Assessment & Plan Note (Signed)
-  Last triglycerides 91, patient reports not taking Lovaza 2 g BID, obtaining fasting lipid panel and pending triglycerides recommend resuming medication therapy if indicated. Recommend to follow a heart healthy diet low in fat and simple carbohydrates.

## 2022-03-11 NOTE — Assessment & Plan Note (Signed)
-  Last lipid panel: HDL 40, LDL 75 (goal<70). -Will obtain fasting lipid panel and hepatic function today. Will continue atorvastatin 40 mg, pending results will adjust treatment plan if indicated. Recommend to follow a heart healthy diet.

## 2022-03-11 NOTE — Patient Instructions (Signed)
Osteoarthritis  Osteoarthritis is a type of arthritis. It refers to joint pain or joint disease. Osteoarthritis affects tissue that covers the ends of bones in joints (cartilage). Cartilage acts as a cushion between the bones and helps them move smoothly. Osteoarthritis occurs when cartilage in the joints gets worn down. Osteoarthritis is sometimes called "wear and tear" arthritis. Osteoarthritis is the most common form of arthritis. It often occurs in older people. It is a condition that gets worse over time. The joints most often affected by this condition are in the fingers, toes, hips, knees, and spine, including the neck and lower back. What are the causes? This condition is caused by the wearing down of cartilage that covers the ends of bones. What increases the risk? The following factors may make you more likely to develop this condition: Being age 50 or older. Obesity. Overuse of joints. Past injury of a joint. Past surgery on a joint. Family history of osteoarthritis. What are the signs or symptoms? The main symptoms of this condition are pain, swelling, and stiffness in the joint. Other symptoms may include: An enlarged joint. More pain and further damage caused by small pieces of bone or cartilage that break off and float inside of the joint. Small deposits of bone (osteophytes) that grow on the edges of the joint. A grating or scraping feeling inside the joint when you move it. Popping or creaking sounds when you move. Difficulty walking or exercising. An inability to grip items, twist your hand(s), or control the movements of your hands and fingers. How is this diagnosed? This condition may be diagnosed based on: Your medical history. A physical exam. Your symptoms. X-rays of the affected joint(s). Blood tests to rule out other types of arthritis. How is this treated? There is no cure for this condition, but treatment can help control pain and improve joint function.  Treatment may include a combination of therapies, such as: Pain relief techniques, such as: Applying heat and cold to the joint. Massage. A form of talk therapy called cognitive behavioral therapy (CBT). This therapy helps you set goals and follow up on the changes that you make. Medicines for pain and inflammation. The medicines can be taken by mouth or applied to the skin. They include: NSAIDs, such as ibuprofen. Prescription medicines. Strong anti-inflammatory medicines (corticosteroids). Certain nutritional supplements. A prescribed exercise program. You may work with a physical therapist. Assistive devices, such as a brace, wrap, splint, specialized glove, or cane. A weight control plan. Surgery, such as: An osteotomy. This is done to reposition the bones and relieve pain or to remove loose pieces of bone and cartilage. Joint replacement surgery. You may need this surgery if you have advanced osteoarthritis. Follow these instructions at home: Activity Rest your affected joints as told by your health care provider. Exercise as told by your health care provider. He or she may recommend specific types of exercise, such as: Strengthening exercises. These are done to strengthen the muscles that support joints affected by arthritis. Aerobic activities. These are exercises, such as brisk walking or water aerobics, that increase your heart rate. Range-of-motion activities. These help your joints move more easily. Balance and agility exercises. Managing pain, stiffness, and swelling     If directed, apply heat to the affected area as often as told by your health care provider. Use the heat source that your health care provider recommends, such as a moist heat pack or a heating pad. If you have a removable assistive device, remove it   as told by your health care provider. Place a towel between your skin and the heat source. If your health care provider tells you to keep the assistive device  on while you apply heat, place a towel between the assistive device and the heat source. Leave the heat on for 20-30 minutes. Remove the heat if your skin turns bright red. This is especially important if you are unable to feel pain, heat, or cold. You may have a greater risk of getting burned. If directed, put ice on the affected area. To do this: If you have a removable assistive device, remove it as told by your health care provider. Put ice in a plastic bag. Place a towel between your skin and the bag. If your health care provider tells you to keep the assistive device on during icing, place a towel between the assistive device and the bag. Leave the ice on for 20 minutes, 2-3 times a day. Move your fingers or toes often to reduce stiffness and swelling. Raise (elevate) the injured area above the level of your heart while you are sitting or lying down. General instructions Take over-the-counter and prescription medicines only as told by your health care provider. Maintain a healthy weight. Follow instructions from your health care provider for weight control. Do not use any products that contain nicotine or tobacco, such as cigarettes, e-cigarettes, and chewing tobacco. If you need help quitting, ask your health care provider. Use assistive devices as told by your health care provider. Keep all follow-up visits as told by your health care provider. This is important. Where to find more information National Institute of Arthritis and Musculoskeletal and Skin Diseases: www.niams.nih.gov National Institute on Aging: www.nia.nih.gov American College of Rheumatology: www.rheumatology.org Contact a health care provider if: You have redness, swelling, or a feeling of warmth in a joint that gets worse. You have a fever along with joint or muscle aches. You develop a rash. You have trouble doing your normal activities. Get help right away if: You have pain that gets worse and is not relieved by  pain medicine. Summary Osteoarthritis is a type of arthritis that affects tissue covering the ends of bones in joints (cartilage). This condition is caused by the wearing down of cartilage that covers the ends of bones. The main symptom of this condition is pain, swelling, and stiffness in the joint. There is no cure for this condition, but treatment can help control pain and improve joint function. This information is not intended to replace advice given to you by your health care provider. Make sure you discuss any questions you have with your health care provider. Document Revised: 06/07/2019 Document Reviewed: 06/07/2019 Elsevier Patient Education  2023 [employer]  

## 2022-03-11 NOTE — Progress Notes (Signed)
Established patient visit   Patient: Steven Herrera   DOB: 17-Oct-1970   51 y.o. Male  MRN: [id number] Visit Date: 03/11/2022  Chief Complaint  Patient presents with   Follow-up   Subjective    HPI  Patient presents for chronic follow-up. Patient reports having increased joint pain especially his hands. Has tried Voltaren gel in the past which provided mild relief.    Mood: Reports noticed some improvement with his mood since his medications were adjusted by his psychiatrist. 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  GERD: Reports symptoms are controlled with his medication therapy.  HLD: Pt taking atorvastatin 40 mg without issues. Reports has not been taking Lovaza. No major changes with his diet.      03/11/2022   10:50 AM 09/03/2021   10:41 AM 05/14/2021   10:14 AM 01/15/2021   11:10 AM 09/11/2020   10:11 AM  Depression screen PHQ 2/9  Decreased Interest _0 Down, Depressed, Hopeless _1 0 0  PHQ - 2 Score _2 Altered sleeping _3 0  Tired, decreased energy _4 Change in appetite 0 0 0 0 1  Feeling bad or failure about yourself  0 3 1  0  Trouble concentrating 1 1 0 2 2  Moving slowly or fidgety/restless 0 0 0 1 0  Suicidal thoughts 0 0 0 0 0  PHQ-9 Score _5 Difficult doing work/chores Not difficult at all Somewhat difficult Somewhat difficult Somewhat difficult       03/11/2022   10:49 AM 09/03/2021   10:41 AM 05/14/2021   10:14 AM 01/15/2021   11:11 AM  GAD 7 : Generalized Anxiety Score  Nervous, Anxious, on Edge _6 Control/stop worrying _7 Worry too much - different things _8 Trouble relaxing _9 Restless _10 Easily annoyed or irritable _11 Afraid - awful might happen 0 _12 Total GAD 7 Score _13 Anxiety Difficulty Not difficult at all Somewhat difficult Somewhat difficult Somewhat difficult       Medications: Outpatient Medications Prior to Visit  Medication Sig   aspirin EC 81 MG tablet Take 81 mg by mouth daily with lunch.    clonazePAM (KLONOPIN) 0.5 MG tablet Take one tablet by mouth twice a daily and 3rd tablet as needed for severe anxiety   Menthol, Topical Analgesic, (ICY HOT EX) Apply 1 application topically 3 (three) times daily as needed (pain).    Multiple Vitamin (MULTIVITAMIN WITH MINERALS) TABS tablet Take 1 tablet by mouth daily with lunch.   omega-3 acid ethyl esters (LOVAZA) 1 g capsule TAKE 2 CAPSULES BY MOUTH 2 TIMES DAILY.   QUEtiapine (SEROQUEL) 50 MG tablet Take 3 tablets (150 mg total) by mouth at bedtime.   [DISCONTINUED] atorvastatin (LIPITOR) 40 MG tablet TAKE 1 TABLET BY MOUTH ONCE DAILY AT 6PM.   [DISCONTINUED] omeprazole (PRILOSEC) 20 MG capsule TAKE 1 CAPSULE BY MOUTH DAILY.   No facility-administered medications prior to visit.    Review of Systems Review of Systems:  A fourteen system review of systems was performed and found to be positive as per HPI.  Last CBC Lab Results  Component Value Date  WBC 7.0 05/14/2021   HGB 16.1 05/14/2021   HCT 47.4 05/14/2021   MCV 95 05/14/2021   MCH 32.3 05/14/2021   RDW 12.9 05/14/2021   PLT 224 75/03/2584   Last metabolic panel Lab Results  Component Value Date   GLUCOSE 79 05/14/2021   NA 141 05/14/2021   K 4.7 05/14/2021   CL 105 05/14/2021   CO2 26 05/14/2021   BUN 7 05/14/2021   CREATININE 0.95 05/14/2021   EGFR 99 05/14/2021   CALCIUM 9.8 05/14/2021   PROT 7.2 05/14/2021   ALBUMIN 4.6 05/14/2021   LABGLOB 2.6 05/14/2021   AGRATIO 1.8 05/14/2021   BILITOT 0.3 05/14/2021   ALKPHOS 73 05/14/2021   AST 25 05/14/2021   ALT 20 05/14/2021   ANIONGAP 11 01/07/2019   Last lipids Lab Results  Component Value Date   CHOL 132 05/14/2021   HDL 40 05/14/2021   LDLCALC 75 05/14/2021   LDLDIRECT 57 02/10/2019   TRIG 91 05/14/2021   CHOLHDL 3.3 05/14/2021   Last hemoglobin A1c Lab  Results  Component Value Date   HGBA1C 5.5 09/11/2020   Last thyroid functions Lab Results  Component Value Date   TSH 0.653 08/30/2019   Last vitamin D Lab Results  Component Value Date   VD25OH 43.5 05/14/2021     Objective    BP 120/82   Pulse 77   Temp 98.1 F (36.7 C) (Oral)   Ht 6' (1.829 m)   Wt 193 lb (87.5 kg)   SpO2 99%   BMI 26.18 kg/m  BP Readings from Last 3 Encounters:  03/11/22 120/82  09/03/21 108/69  05/14/21 112/71   Wt Readings from Last 3 Encounters:  03/11/22 193 lb (87.5 kg)  01/29/22 193 lb (87.5 kg)  09/03/21 193 lb (87.5 kg)    Physical Exam  General:  Pleasant and cooperative, in no acute distress, appropriate for stated age.  Neuro:  Alert and oriented,  extra-ocular muscles intact  HEENT:  Normocephalic, atraumatic, neck supple  Skin:  no gross rash, warm, pink. Cardiac:  RRR, S1 S2 Respiratory: CTA B/L w/o wheezing. Vascular:  Ext warm, no cyanosis apprec.; cap RF less 2 sec. Psych:  No HI/SI, judgement and insight good, Euthymic mood. Full Affect.   No results found for any visits on 03/11/22.  Assessment & Plan      Problem List Items Addressed This Visit       Other   High cholesterol (Chronic)    -Last lipid panel: HDL 40, LDL 75 (goal<70). -Will obtain fasting lipid panel and hepatic function today. Will continue atorvastatin 40 mg, pending results will adjust treatment plan if indicated. Recommend to follow a heart healthy diet.      Relevant Medications   atorvastatin (LIPITOR) 40 MG tablet   Other Relevant Orders   CBC w/Diff   Comp Met (CMET)   Lipid Profile   Vitamin D deficiency   Relevant Orders   CBC w/Diff   Comp Met (CMET)   Vitamin D (25 hydroxy)   Hypertriglyceridemia    -Last triglycerides 91, patient reports not taking Lovaza 2 g BID, obtaining fasting lipid panel and pending triglycerides recommend resuming medication therapy if indicated. Recommend to follow a heart healthy diet low in fat and  simple carbohydrates.      Relevant Medications   atorvastatin (LIPITOR) 40 MG tablet   Other Visit Diagnoses     Bipolar 1 disorder ([place])    -  Primary   Gastroesophageal  reflux disease, unspecified whether esophagitis present       Relevant Medications   omeprazole (PRILOSEC) 20 MG capsule   Chronic pain of multiple joints       Relevant Medications   celecoxib (CELEBREX) 100 MG capsule      Bipolar 1 disorder: -Followed by psychiatry. Reviewed most recent note, Seroquel increased to 150 mg at bedtime and Klonopin 0.5 mg twice daily and can take 1/3 tablet as needed.  GERD: -Stable. Continue omeprazole 20 mg daily. Will continue to monitor.  Chronic pain of multiple joints: -Discussed with patient management options. Patient has intolerance to ibuprofen due to bloody stools and has failed topical anti-inflammatory. Will trial Celebrex 100 mg BID as needed for moderate pain, discussed potential side effects and advised to discontinue medication if develops bloody or dark stools. Patient is on PPI therapy which I recommend he continue. Also discussed duloxetine for chronic MSK pain management and recommend discussing with his psychiatrist before starting medication therapy due to his sensitivity to medications. Pt verbalized understanding.  Return in about 6 months (around 09/09/2022) for CPE and FBW.        Lorrene Reid, PA-C  [facility] [phone] (phone) [phone] (fax)  [place]

## 2022-03-12 LAB — CBC WITH DIFFERENTIAL/PLATELET
Basophils Absolute: 0.1 10*3/uL (ref 0.0–0.2)
Basos: 1 %
EOS (ABSOLUTE): 0.2 10*3/uL (ref 0.0–0.4)
Eos: 2 %
Hematocrit: 48 % (ref 37.5–51.0)
Hemoglobin: 16.2 g/dL (ref 13.0–17.7)
Immature Grans (Abs): 0 10*3/uL (ref 0.0–0.1)
Immature Granulocytes: 0 %
Lymphocytes Absolute: 2 10*3/uL (ref 0.7–3.1)
Lymphs: 20 %
MCH: 32.3 pg (ref 26.6–33.0)
MCHC: 33.8 g/dL (ref 31.5–35.7)
MCV: 96 fL (ref 79–97)
Monocytes Absolute: 1 10*3/uL — ABNORMAL HIGH (ref 0.1–0.9)
Monocytes: 10 %
Neutrophils Absolute: 6.5 10*3/uL (ref 1.4–7.0)
Neutrophils: 67 %
Platelets: 234 10*3/uL (ref 150–450)
RBC: 5.01 x10E6/uL (ref 4.14–5.80)
RDW: 12.7 % (ref 11.6–15.4)
WBC: 9.8 10*3/uL (ref 3.4–10.8)

## 2022-03-12 LAB — COMPREHENSIVE METABOLIC PANEL
ALT: 19 IU/L (ref 0–44)
AST: 23 IU/L (ref 0–40)
Albumin/Globulin Ratio: 1.8 (ref 1.2–2.2)
Albumin: 4.4 g/dL (ref 4.1–5.1)
Alkaline Phosphatase: 78 IU/L (ref 44–121)
BUN/Creatinine Ratio: 11 (ref 9–20)
BUN: 10 mg/dL (ref 6–24)
Bilirubin Total: 0.3 mg/dL (ref 0.0–1.2)
CO2: 22 mmol/L (ref 20–29)
Calcium: 9.3 mg/dL (ref 8.7–10.2)
Chloride: 105 mmol/L (ref 96–106)
Creatinine, Ser: 0.87 mg/dL (ref 0.76–1.27)
Globulin, Total: 2.5 g/dL (ref 1.5–4.5)
Glucose: 85 mg/dL (ref 70–99)
Potassium: 4.8 mmol/L (ref 3.5–5.2)
Sodium: 141 mmol/L (ref 134–144)
Total Protein: 6.9 g/dL (ref 6.0–8.5)
eGFR: 106 mL/min/{1.73_m2} (ref >59)

## 2022-03-12 LAB — LIPID PANEL
Chol/HDL Ratio: 4 ratio (ref 0.0–5.0)
Cholesterol, Total: 151 mg/dL (ref 100–199)
HDL: 38 mg/dL — ABNORMAL LOW (ref >39)
LDL Chol Calc (NIH): 94 mg/dL (ref 0–99)
Triglycerides: 105 mg/dL (ref 0–149)
VLDL Cholesterol Cal: 19 mg/dL (ref 5–40)

## 2022-03-12 LAB — VITAMIN D 25 HYDROXY (VIT D DEFICIENCY, FRACTURES): Vit D, 25-Hydroxy: 36 ng/mL (ref 30.0–100.0)

## 2022-04-16 ENCOUNTER — Other Ambulatory Visit (HOSPITAL_COMMUNITY): Payer: Self-pay

## 2022-04-18 ENCOUNTER — Other Ambulatory Visit (HOSPITAL_COMMUNITY): Payer: Self-pay

## 2022-04-30 ENCOUNTER — Telehealth (HOSPITAL_COMMUNITY): Payer: 59 | Admitting: Psychiatry

## 2022-05-23 ENCOUNTER — Telehealth (HOSPITAL_BASED_OUTPATIENT_CLINIC_OR_DEPARTMENT_OTHER): Payer: 59 | Admitting: Psychiatry

## 2022-05-23 ENCOUNTER — Other Ambulatory Visit (HOSPITAL_COMMUNITY): Payer: Self-pay

## 2022-05-23 ENCOUNTER — Encounter (HOSPITAL_COMMUNITY): Payer: Self-pay | Admitting: Psychiatry

## 2022-05-23 DIAGNOSIS — F319 Bipolar disorder, unspecified: Secondary | ICD-10-CM

## 2022-05-23 DIAGNOSIS — F419 Anxiety disorder, unspecified: Secondary | ICD-10-CM | POA: Diagnosis not present

## 2022-05-23 MED ORDER — QUETIAPINE FUMARATE 50 MG PO TABS
150.0000 mg | ORAL_TABLET | Freq: Every day | ORAL | 2 refills | Status: DC
  Filled 2022-05-23: qty 90, 30d supply, fill #0
  Filled 2022-06-26: qty 90, 30d supply, fill #1
  Filled 2022-08-12: qty 90, 30d supply, fill #2

## 2022-05-23 MED ORDER — CLONAZEPAM 0.5 MG PO TABS
ORAL_TABLET | ORAL | 2 refills | Status: DC
  Filled 2022-05-23: qty 75, 25d supply, fill #0
  Filled 2022-06-26: qty 75, 25d supply, fill #1
  Filled 2022-08-12: qty 75, 25d supply, fill #2

## 2022-05-23 NOTE — Progress Notes (Signed)
Virtual Visit via Telephone Note  I connected with Steven Herrera on 05/23/22 at 10:20 AM EST by telephone and verified that I am speaking with the correct person using two identifiers.  Location: Patient: Work Provider: Biomedical scientist   I discussed the limitations, risks, security and privacy concerns of performing an evaluation and management service by telephone and the availability of in person appointments. I also discussed with the patient that there may be a patient responsible charge related to this service. The patient expressed understanding and agreed to proceed.   History of Present Illness: Patient is evaluated by phone session.  On the last visit we increased Seroquel and he is doing better.  He is sleeping good.  He is able to manage his irritability and frustration better than he anticipated.  Had a good Thanksgiving with the grandkids.  He reported when he got upset or angry he usually walks out that helps.  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  He denies any mania, hallucination, suicidal thoughts.  His wife is very supportive.  He has no issues with the medication other than tremors which are chronic stable and does not cause issue with his work.  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  Patient denies any panic attack and he feels current medicine working.  He is not interested in therapy.     Past Psychiatric History:  H/O briefly seeing therapist at [facility] in young age.  H/O mood swing, anger, agitation, hyperactivity and running away.  [place] acid, LSD, cocaine, marijuana and heavy drinking.  Tried Zoloft, Lexapro, melatonin and Xanax did not like it. Took Paxil and Klonopin and Wellbutrin from PCP. We tried Lamictal.  Psychiatric Specialty Exam: Physical Exam  Review of Systems  Weight 193 lb (87.5 kg).There is no height or weight on file to calculate BMI.  General Appearance: NA  Eye Contact:  NA  Speech:  Normal Rate   Volume:  Normal  Mood:  Euthymic  Affect:  NA  Thought Process:  Goal Directed  Orientation:  Full (Time, Place, and Person)  Thought Content:  Logical  Suicidal Thoughts:  No  Homicidal Thoughts:  No  Memory:  Immediate;   Good Recent;   Fair Remote;   Fair  Judgement:  Intact  Insight:  Present  Psychomotor Activity:  NA  Concentration:  Concentration: Fair and Attention Span: Fair  Recall:  Good  Fund of Knowledge:  Good  Language:  Good  Akathisia:  No  Handed:  Right  AIMS (if indicated):     Assets:  Communication Skills Desire for [place] Talents/Skills Transportation  ADL's:  Intact  Cognition:  WNL  Sleep:   better      Assessment and Plan: Bipolar disorder type I.  Anxiety.  Patient doing better on Seroquel 150 mg at bedtime and Klonopin 0.5 mg 2 times a day and really he needs 1/3 tablet if needed.  Discussed medication side effects and benefits.  His tremors are chronic but stable.  Patient comfortable with the current medication.  We will continue his medication.  Recommended to call us back if is any question or any concern.  Follow-up in 3 months.    Follow Up Instructions:    I discussed the assessment and treatment plan with the patient. The patient was provided an opportunity to ask questions and all were answered. The patient agreed with the plan and demonstrated an understanding of  the instructions.   The patient was advised to call back or seek an in-person evaluation if the symptoms worsen or if the condition fails to improve as anticipated.  Collaboration of Care: Other provider involved in patient's care AEB notes are available in epic to review.  Patient/Guardian was advised Release of Information must be obtained prior to any record release in order to collaborate their care with an outside provider. Patient/Guardian was advised if they have not already done so to contact the registration department to sign all  necessary forms in order for [place] to release information regarding their care.   Consent: Patient/Guardian gives verbal consent for treatment and assignment of benefits for services provided during this visit. Patient/Guardian expressed understanding and agreed to proceed.    I provided 20 minutes of non-face-to-face time during this encounter.   Kathlee Nations, MD

## 2022-05-24 ENCOUNTER — Other Ambulatory Visit (HOSPITAL_COMMUNITY): Payer: Self-pay

## 2022-06-20 ENCOUNTER — Other Ambulatory Visit (HOSPITAL_COMMUNITY): Payer: Self-pay

## 2022-06-26 ENCOUNTER — Other Ambulatory Visit: Payer: Self-pay

## 2022-06-26 ENCOUNTER — Other Ambulatory Visit (HOSPITAL_COMMUNITY): Payer: Self-pay

## 2022-06-28 ENCOUNTER — Other Ambulatory Visit (HOSPITAL_COMMUNITY): Payer: Self-pay

## 2022-06-28 ENCOUNTER — Encounter (HOSPITAL_COMMUNITY): Payer: Self-pay

## 2022-07-01 ENCOUNTER — Other Ambulatory Visit (HOSPITAL_COMMUNITY): Payer: Self-pay

## 2022-08-12 ENCOUNTER — Other Ambulatory Visit: Payer: Self-pay

## 2022-08-12 ENCOUNTER — Other Ambulatory Visit (HOSPITAL_COMMUNITY): Payer: Self-pay

## 2022-08-16 ENCOUNTER — Telehealth (HOSPITAL_BASED_OUTPATIENT_CLINIC_OR_DEPARTMENT_OTHER): Payer: Commercial Managed Care - PPO | Admitting: Psychiatry

## 2022-08-16 ENCOUNTER — Encounter (HOSPITAL_COMMUNITY): Payer: Self-pay | Admitting: Psychiatry

## 2022-08-16 ENCOUNTER — Other Ambulatory Visit (HOSPITAL_COMMUNITY): Payer: Self-pay

## 2022-08-16 DIAGNOSIS — F319 Bipolar disorder, unspecified: Secondary | ICD-10-CM

## 2022-08-16 DIAGNOSIS — F419 Anxiety disorder, unspecified: Secondary | ICD-10-CM

## 2022-08-16 MED ORDER — CLONAZEPAM 0.5 MG PO TABS
ORAL_TABLET | ORAL | 2 refills | Status: DC
  Filled 2022-08-16 – 2022-09-13 (×2): qty 75, 25d supply, fill #0
  Filled 2022-10-25: qty 75, 25d supply, fill #1

## 2022-08-16 MED ORDER — QUETIAPINE FUMARATE 50 MG PO TABS
150.0000 mg | ORAL_TABLET | Freq: Every day | ORAL | 2 refills | Status: DC
  Filled 2022-08-16 – 2022-09-13 (×2): qty 90, 30d supply, fill #0
  Filled 2022-10-25: qty 90, 30d supply, fill #1

## 2022-08-16 NOTE — Progress Notes (Signed)
[facility] MD Virtual Progress Note   Patient Location: Home Provider Location: Home Office  I connect with patient by telephone and verified that I am speaking with correct person by using two identifiers. I discussed the limitations of evaluation and management by telemedicine and the availability of in person appointments. I also discussed with the patient that there may be a patient responsible charge related to this service. The patient expressed understanding and agreed to proceed.  Steven Herrera [id number] 52 y.o.  08/16/2022 10:01 AM    History of Present Illness:  Patient is evaluated by phone session.  He had a good Christmas.  He was able to see his 5 grandkids.  His older son lives close by and younger son kids live in [place].  Patient reported work is going okay.  He denies any mania, anger, agitation or any suicidal thoughts.  He sleeps 5 to 6 hours.  Sometimes he wakes up in the night and then he had a hard time going back to sleep.  He is taking Klonopin 0.5 mg twice a day and sometimes the takes the third tablet if needed for anxiety.  He denies cannabis use but sometimes drinks beer currently come back home from work.  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  He has mild tremors but able to function.  So far tremors are not interfering in his work.  Patient has appointment coming up with his PCP in few weeks.  He admitted weight gain because he is not as active but hoping once spring starts he is able to do yard work and able to lose some weight.  Patient like to keep the Seroquel and Klonopin.  Patient lives with his wife who also works.  Past Psychiatric History:  H/O briefly seeing therapist at [facility] in young age.  H/O mood swing, anger, agitation, hyperactivity and running away.  Use acid, LSD, cocaine, marijuana and heavy drinking.  Tried Zoloft, Lexapro, melatonin and Xanax did not like it. Took Paxil and Klonopin and Wellbutrin from  PCP. We tried Lamictal.   Outpatient Encounter Medications as of 08/16/2022  Medication Sig   aspirin EC 81 MG tablet Take 81 mg by mouth daily with lunch.    atorvastatin (LIPITOR) 40 MG tablet Take 1 tablet (40 mg total) by mouth daily at 6 PM.   celecoxib (CELEBREX) 100 MG capsule Take 1 capsule (100 mg total) by mouth 2 (two) times daily as needed for moderate pain.   clonazePAM (KLONOPIN) 0.5 MG tablet Take one tablet by mouth twice a daily and 3rd tablet as needed for severe anxiety   Menthol, Topical Analgesic, (ICY HOT EX) Apply 1 application topically 3 (three) times daily as needed (pain).    Multiple Vitamin (MULTIVITAMIN WITH MINERALS) TABS tablet Take 1 tablet by mouth daily with lunch.   omega-3 acid ethyl esters (LOVAZA) 1 g capsule TAKE 2 CAPSULES BY MOUTH 2 TIMES DAILY.   omeprazole (PRILOSEC) 20 MG capsule Take 1 capsule (20 mg total) by mouth daily.   QUEtiapine (SEROQUEL) 50 MG tablet Take 3 tablets (150 mg total) by mouth at bedtime.   No facility-administered encounter medications on file as of 08/16/2022.    No results found for this or any previous visit (from the past 2160 hour(s)).   Psychiatric Specialty Exam: Physical Exam  Review of Systems  Weight 200 lb (90.7 kg).There is no height or weight on file to calculate BMI.  General Appearance: NA  Eye  Contact:  NA  Speech:  Normal Rate  Volume:  Normal  Mood:  Euthymic  Affect:  NA  Thought Process:  Goal Directed  Orientation:  Full (Time, Place, and Person)  Thought Content:  WDL  Suicidal Thoughts:  No  Homicidal Thoughts:  No  Memory:  Immediate;   Good Recent;   Good Remote;   Fair  Judgement:  Intact  Insight:  Present  Psychomotor Activity:  Tremor  Concentration:  Concentration: Fair and Attention Span: Fair  Recall:  Good  Fund of Knowledge:  Good  Language:  Good  Akathisia:  No  Handed:  Right  AIMS (if indicated):     Assets:  Communication Skills Desire for  [place] Talents/Skills Transportation  ADL's:  Intact  Cognition:  WNL  Sleep:  5-6 hrs     Assessment/Plan: Anxiety - Plan: clonazePAM (KLONOPIN) 0.5 MG tablet  Bipolar I disorder ([place]) - Plan: QUEtiapine (SEROQUEL) 50 MG tablet  Patient is stable on his current medication.  He like to keep the Seroquel 150 mg at bedtime and Klonopin 0.5 mg 2 times a day and another tablet as needed for anxiety.  He has tremors but chronic and is stable.  Encouraged to keep appointment with his primary care doctor.  Discussed walking, watching his calorie intake and exercise.  Recommended to call us back if is any question or any concern.  Follow-up in 3 months.   Follow Up Instructions:     I discussed the assessment and treatment plan with the patient. The patient was provided an opportunity to ask questions and all were answered. The patient agreed with the plan and demonstrated an understanding of the instructions.   The patient was advised to call back or seek an in-person evaluation if the symptoms worsen or if the condition fails to improve as anticipated.    Collaboration of Care: Other provider involved in patient's care AEB notes are available in epic to review.  Patient/Guardian was advised Release of Information must be obtained prior to any record release in order to collaborate their care with an outside provider. Patient/Guardian was advised if they have not already done so to contact the registration department to sign all necessary forms in order for [place] to release information regarding their care.   Consent: Patient/Guardian gives verbal consent for treatment and assignment of benefits for services provided during this visit. Patient/Guardian expressed understanding and agreed to proceed.     I provided 21 minutes of non face to face time during this encounter.  Kathlee Nations, MD 08/16/2022

## 2022-08-22 ENCOUNTER — Telehealth (HOSPITAL_COMMUNITY): Payer: Self-pay | Admitting: Psychiatry

## 2022-09-02 ENCOUNTER — Encounter: Payer: Self-pay | Admitting: Family Medicine

## 2022-09-02 ENCOUNTER — Ambulatory Visit (INDEPENDENT_AMBULATORY_CARE_PROVIDER_SITE_OTHER): Payer: 59 | Admitting: Family Medicine

## 2022-09-02 ENCOUNTER — Other Ambulatory Visit (HOSPITAL_COMMUNITY): Payer: Self-pay

## 2022-09-02 ENCOUNTER — Other Ambulatory Visit: Payer: Self-pay

## 2022-09-02 VITALS — BP 121/84 | HR 89 | Ht 72.0 in | Wt 198.4 lb

## 2022-09-02 DIAGNOSIS — D1030 Benign neoplasm of unspecified part of mouth: Secondary | ICD-10-CM

## 2022-09-02 DIAGNOSIS — E7800 Pure hypercholesterolemia, unspecified: Secondary | ICD-10-CM

## 2022-09-02 DIAGNOSIS — K219 Gastro-esophageal reflux disease without esophagitis: Secondary | ICD-10-CM | POA: Diagnosis not present

## 2022-09-02 DIAGNOSIS — Z0000 Encounter for general adult medical examination without abnormal findings: Secondary | ICD-10-CM

## 2022-09-02 DIAGNOSIS — F17209 Nicotine dependence, unspecified, with unspecified nicotine-induced disorders: Secondary | ICD-10-CM | POA: Diagnosis not present

## 2022-09-02 DIAGNOSIS — R030 Elevated blood-pressure reading, without diagnosis of hypertension: Secondary | ICD-10-CM | POA: Diagnosis not present

## 2022-09-02 DIAGNOSIS — E781 Pure hyperglyceridemia: Secondary | ICD-10-CM

## 2022-09-02 DIAGNOSIS — E559 Vitamin D deficiency, unspecified: Secondary | ICD-10-CM

## 2022-09-02 DIAGNOSIS — Z720 Tobacco use: Secondary | ICD-10-CM | POA: Insufficient documentation

## 2022-09-02 MED ORDER — OMEPRAZOLE 20 MG PO CPDR
20.0000 mg | DELAYED_RELEASE_CAPSULE | Freq: Every day | ORAL | 1 refills | Status: DC
  Filled 2022-09-02 – 2022-09-13 (×2): qty 90, 90d supply, fill #0
  Filled 2022-12-24: qty 90, 90d supply, fill #1

## 2022-09-02 MED ORDER — ATORVASTATIN CALCIUM 40 MG PO TABS
40.0000 mg | ORAL_TABLET | Freq: Every day | ORAL | 1 refills | Status: DC
  Filled 2022-09-02 – 2022-09-13 (×2): qty 90, 90d supply, fill #0

## 2022-09-02 NOTE — Progress Notes (Signed)
Complete physical exam  Patient: Steven Herrera   DOB: 1970/07/07   52 y.o. Male  MRN: [id number]  Subjective:    Chief Complaint  Patient presents with   Annual Exam    Steven Herrera is a 52 y.o. male who presents today for a complete physical exam. He reports consuming a general diet. 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 He generally feels well. He reports sleeping well as long as he has the Seroquel prescribed by behavioral health. He does have additional problems to discuss today.  He has been using chewing tobacco since he was about 52 years old.  Within the past 3 months he has noticed a dark spot on the corner of his left lower lip.  He remembers a family member who also used chewing tobacco developed a spot similar to this and ended up diagnosed with oral cancer, so he is concerned and would like to have it evaluated.   Most recent fall risk assessment:    03/11/2022   10:49 AM  Fall Risk   Falls in the past year? 0  Number falls in past yr: 0  Injury with Fall? 0  Risk for fall due to : No Fall Risks  Follow up Falls evaluation completed     Most recent depression screenings:    03/11/2022   10:50 AM 09/03/2021   10:41 AM  PHQ 2/9 Scores  PHQ - 2 Score 2 2  PHQ- 9 Score 7 10    Patient Active Problem List   Diagnosis Date Noted   Chewing tobacco use 09/02/2022   Gastroesophageal reflux disease 09/02/2022   Deep vein thrombosis (DVT) of both lower extremities ([place]) 05/24/2019   Hypertriglyceridemia 02/08/2019   Vitamin D deficiency 06/01/2018   High cholesterol 02/09/2018   PFO with atrial septal aneurysm 11/19/2017   Depression with anxiety 06/27/2017   Cerebrovascular accident (CVA) ([place]) 06/27/2017   Acute ischemic stroke ([place]) 06/26/2017   Elevated blood-pressure reading without diagnosis of hypertension 06/26/2017   NEVUS 02/13/2007    Past Surgical History:  Procedure Laterality Date   big toe amputation Right    CARPAL  TUNNEL RELEASE     LOOP RECORDER INSERTION N/A 09/25/2017   Procedure: LOOP RECORDER INSERTION;  Surgeon: [name], MD;  Location: [facility];  Service: Cardiovascular;  Laterality: N/A;   MOUTH SURGERY     PATENT FORAMEN OVALE(PFO) CLOSURE N/A 11/19/2017   Procedure: PATENT FORAMEN OVALE (PFO) CLOSURE;  Surgeon: [name], MD;  Location: [facility];  Service: Cardiovascular;  Laterality: N/A;   spider bite     TEE WITHOUT CARDIOVERSION N/A 09/25/2017   Procedure: TRANSESOPHAGEAL ECHOCARDIOGRAM (TEE) WITH LOOP;  Surgeon: Pixie Casino, MD;  Location: [facility];  Service: Cardiovascular;  Laterality: N/A;   VASECTOMY     Social History   Tobacco Use   Smoking status: Every Day    Packs/day: 0.25    Years: 28.00    Total pack years: 7.00    Types: Cigarettes, Cigars   Smokeless tobacco: Current    Types: Chew   Tobacco comments:    last used 11/12/20  Vaping Use   Vaping Use: Former  Substance Use Topics   Alcohol use: Yes    Alcohol/week: 14.0 - 20.0 standard drinks of alcohol    Types: 14 - 20 Cans of beer per week    Comment: 2-3 beers per night per pt   Drug use: Not Currently  Types: Marijuana   Family History  Problem Relation Age of Onset   Stroke Father        disabling CVA in early-mid 42's   Hypertension Father    Alcohol abuse Father    Heart disease Mother    Dementia Mother    Atrial fibrillation Mother    Drug abuse Sister    Hypertension Brother    Healthy Sister    Colon cancer Neg Hx    Colon polyps Neg Hx    Esophageal cancer Neg Hx    Rectal cancer Neg Hx    Stomach cancer Neg Hx    Allergies  Allergen Reactions   Ibuprofen Other (See Comments)    Bloody stools   Penicillins Hives    Has patient had a PCN reaction causing immediate rash, facial/tongue/throat swelling, SOB or lightheadedness with hypotension: No Has patient had a PCN reaction causing severe rash involving mucus membranes or skin necrosis: Yes Has  patient had a PCN reaction that required hospitalization: no Has patient had a PCN reaction occurring within the last 10 years: no If all of the above answers are "NO", then may proceed with Cephalosporin use.     Patient Care Team: Lorrene Reid, PA-C (Inactive) as PCP - General (Physician Assistant) Garvin Fila, MD as Consulting Physician (Neurology) Freddie Breech, [facility]   Outpatient Medications Prior to Visit  Medication Sig   aspirin EC 81 MG tablet Take 81 mg by mouth daily with lunch.    clonazePAM (KLONOPIN) 0.5 MG tablet Take one tablet by mouth twice a daily and 3rd tablet as needed for severe anxiety   Menthol, Topical Analgesic, (ICY HOT EX) Apply 1 application topically 3 (three) times daily as needed (pain).    Multiple Vitamin (MULTIVITAMIN WITH MINERALS) TABS tablet Take 1 tablet by mouth daily with lunch.   omega-3 acid ethyl esters (LOVAZA) 1 g capsule TAKE 2 CAPSULES BY MOUTH 2 TIMES DAILY.   QUEtiapine (SEROQUEL) 50 MG tablet Take 3 tablets (150 mg total) by mouth at bedtime.   [DISCONTINUED] atorvastatin (LIPITOR) 40 MG tablet Take 1 tablet (40 mg total) by mouth daily at 6 PM.   [DISCONTINUED] omeprazole (PRILOSEC) 20 MG capsule Take 1 capsule (20 mg total) by mouth daily.   [DISCONTINUED] celecoxib (CELEBREX) 100 MG capsule Take 1 capsule (100 mg total) by mouth 2 (two) times daily as needed for moderate pain.   No facility-administered medications prior to visit.    ROS     Objective:    BP 121/84   Pulse 89   Ht 6' (1.829 m)   Wt 198 lb 6.4 oz (90 kg)   SpO2 97%   BMI 26.91 kg/m    Physical Exam   No results found for any visits on 09/02/22.     Assessment & Plan:    Routine Health Maintenance and Physical Exam  Immunization History  Administered Date(s) Administered   Hepatitis B, ADULT 02/09/2014, 04/05/2014   Hepatitis B, PED/ADOLESCENT 11/05/2013   Tdap 08/10/2018    Health Maintenance  Topic Date Due   COVID-19  Vaccine (1) Never done   Zoster Vaccines- Shingrix (1 of 2) Never done   INFLUENZA VACCINE  Never done   COLONOSCOPY (Pts 45-97yr Insurance coverage will need to be confirmed)  11/15/2027   DTaP/Tdap/Td (2 - Td or Tdap) 08/10/2028   Hepatitis C Screening  Completed   HIV Screening  Completed   HPV VACCINES  Aged Out  Discussed health benefits of physical activity, and encouraged him to engage in regular exercise appropriate for his age and condition.  Wellness examination -     Atorvastatin Calcium; Take 1 tablet (40 mg total) by mouth daily at 6 PM.  Dispense: 90 tablet; Refill: 1 -     CBC with Differential/Platelet; Future -     Comprehensive metabolic panel; Future -     Hemoglobin A1c; Future -     Lipid panel; Future -     VITAMIN D 25 Hydroxy (Vit-D Deficiency, Fractures); Future  Hypertriglyceridemia Assessment & Plan: Last lipid panel: LDL 94, triglycerides 105, HDL 38.  Repeating lipid panel and CMP for hepatic function today.  Continue atorvastatin 40 mg daily unless lab results indicate change in treatment plan is necessary.  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  Orders: -     Atorvastatin Calcium; Take 1 tablet (40 mg total) by mouth daily at 6 PM.  Dispense: 90 tablet; Refill: 1 -     Lipid panel; Future  High cholesterol Assessment & Plan: Last lipid panel: LDL 94, triglycerides 105, HDL 38.  Repeating lipid panel and CMP for hepatic function today.  Continue atorvastatin 40 mg daily unless lab results indicate change in treatment plan is necessary.  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  Orders: -     Lipid panel; Future  Vitamin D deficiency -     VITAMIN D 25 Hydroxy (Vit-D Deficiency, Fractures); Future  Elevated blood-pressure reading without diagnosis of hypertension Assessment & Plan: Blood pressure 121/84 today.  Not on blood pressure  medication currently, will continue to monitor.  Orders: -     CBC with Differential/Platelet; Future -     Comprehensive metabolic panel; Future  Gastroesophageal reflux disease, unspecified whether esophagitis present Assessment & Plan: Currently well-controlled on omeprazole 20 mg daily before breakfast.  Continue current regimen and avoiding trigger foods.  Will reassess in 6 months.  Orders: -     Omeprazole; Take 1 capsule (20 mg total) by mouth daily.  Dispense: 90 capsule; Refill: 1  Chewing tobacco use Assessment & Plan: He has been using chewing tobacco since he was 52 years old and knows that it is not something that he should be doing, but he does feel that he is dependent on it.  He is not interested in quitting at this time, but if he is diagnosed with oral cancer then he would definitely be interested in quitting.  We will continue to follow-up and provide support and resources if he does get to the point where he would like to quit.  Orders: -     Ambulatory referral to Dermatology  Nevus of oral mucosa -     Ambulatory referral to Dermatology    Return in about 6 months (around 03/05/2023) for follow-up on hyperlipidemia, fasting blood work.     Velva Harman, PA

## 2022-09-02 NOTE — Assessment & Plan Note (Signed)
He has been using chewing tobacco since he was 52 years old and knows that it is not something that he should be doing, but he does feel that he is dependent on it.  He is not interested in quitting at this time, but if he is diagnosed with oral cancer then he would definitely be interested in quitting.  We will continue to follow-up and provide support and resources if he does get to the point where he would like to quit.

## 2022-09-02 NOTE — Assessment & Plan Note (Signed)
Blood pressure 121/84 today.  Not on blood pressure medication currently, will continue to monitor.

## 2022-09-02 NOTE — Assessment & Plan Note (Addendum)
Currently well-controlled on omeprazole 20 mg daily before breakfast.  Continue current regimen and avoiding trigger foods.  Will reassess in 6 months.

## 2022-09-02 NOTE — Assessment & Plan Note (Signed)
Last lipid panel: LDL 94, triglycerides 105, HDL 38.  Repeating lipid panel and CMP for hepatic function today.  Continue atorvastatin 40 mg daily unless lab results indicate change in treatment plan is necessary.  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

## 2022-09-02 NOTE — Addendum Note (Signed)
Addended by: Virgil Benedict on: 09/02/2022 11:30 AM   Modules accepted: Orders

## 2022-09-03 ENCOUNTER — Other Ambulatory Visit: Payer: Self-pay | Admitting: Family Medicine

## 2022-09-03 DIAGNOSIS — E781 Pure hyperglyceridemia: Secondary | ICD-10-CM

## 2022-09-03 DIAGNOSIS — E559 Vitamin D deficiency, unspecified: Secondary | ICD-10-CM

## 2022-09-03 MED ORDER — FENOFIBRATE 50 MG PO CAPS
1.0000 | ORAL_CAPSULE | Freq: Every day | ORAL | 0 refills | Status: DC
  Filled 2022-09-03: qty 90, 90d supply, fill #0

## 2022-09-03 MED ORDER — VITAMIN D (ERGOCALCIFEROL) 1.25 MG (50000 UNIT) PO CAPS
50000.0000 [IU] | ORAL_CAPSULE | ORAL | 1 refills | Status: DC
  Filled 2022-09-03: qty 5, 35d supply, fill #0

## 2022-09-04 ENCOUNTER — Telehealth: Payer: Self-pay

## 2022-09-04 ENCOUNTER — Other Ambulatory Visit (HOSPITAL_COMMUNITY): Payer: Self-pay

## 2022-09-04 ENCOUNTER — Other Ambulatory Visit: Payer: Self-pay

## 2022-09-04 NOTE — Telephone Encounter (Signed)
-----   Message from Velva Harman, [place] sent at 09/03/2022  5:16 PM EDT ----- Please call patient with results.  I will also need to see him again in 6 to 8 weeks. Hi Kraig, Your cholesterol levels and triglycerides have shot up a ton since they were last checked 5 months ago.  I would like you to increase your atorvastatin to 80 mg (2 tablets) by mouth daily at 6 PM.  If you are agreeable to it, I also think that it would be very beneficial to add a medication that will specifically lower your triglyceride levels.  Having such high levels can put you at risk for more severe complications like pancreatitis, which I would love to avoid for you.  Even if we only do this medication for short while to get your triglycerides back to a lower level, I think it will be beneficial.  I have sent in a prescription for it for you to pick up.  With these changes, I would like to see you back in 6 to 8 weeks and recheck a fasting cholesterol panel to make sure that your levels have gone down. Lastly, your vitamin D level was very very low, so I also sent in a prescription for a weekly strong dose of vitamin D for you to pick up. Otherwise, labs were stable from the last time they were checked.

## 2022-09-04 NOTE — Telephone Encounter (Signed)
Pt read results. Had no questions.  Scheduled his appt 10/28/22 at 10:10 knowing he needs FBW

## 2022-09-09 ENCOUNTER — Encounter: Payer: 59 | Admitting: Nurse Practitioner

## 2022-09-13 ENCOUNTER — Other Ambulatory Visit: Payer: Self-pay

## 2022-09-13 ENCOUNTER — Other Ambulatory Visit (HOSPITAL_COMMUNITY): Payer: Self-pay

## 2022-09-17 LAB — CBC WITH DIFFERENTIAL/PLATELET
Basophils Absolute: 0.1 10*3/uL (ref 0.0–0.2)
Basos: 1 %
EOS (ABSOLUTE): 0.2 10*3/uL (ref 0.0–0.4)
Eos: 2 %
Hematocrit: 46.9 % (ref 37.5–51.0)
Hemoglobin: 16.5 g/dL (ref 13.0–17.7)
Immature Grans (Abs): 0 10*3/uL (ref 0.0–0.1)
Immature Granulocytes: 0 %
Lymphocytes Absolute: 2.5 10*3/uL (ref 0.7–3.1)
Lymphs: 32 %
MCH: 33.3 pg — ABNORMAL HIGH (ref 26.6–33.0)
MCHC: 35.2 g/dL (ref 31.5–35.7)
MCV: 95 fL (ref 79–97)
Monocytes Absolute: 1 10*3/uL — ABNORMAL HIGH (ref 0.1–0.9)
Monocytes: 12 %
Neutrophils Absolute: 4.3 10*3/uL (ref 1.4–7.0)
Neutrophils: 53 %
Platelets: 251 10*3/uL (ref 150–450)
RBC: 4.96 x10E6/uL (ref 4.14–5.80)
RDW: 12.8 % (ref 11.6–15.4)
WBC: 8.1 10*3/uL (ref 3.4–10.8)

## 2022-09-17 LAB — COMPREHENSIVE METABOLIC PANEL
ALT: 33 IU/L (ref 0–44)
Albumin/Globulin Ratio: 1.6 (ref 1.2–2.2)
Albumin: 4.4 g/dL (ref 4.1–5.1)
Alkaline Phosphatase: 111 IU/L (ref 44–121)
BUN/Creatinine Ratio: 17 (ref 9–20)
BUN: 15 mg/dL (ref 6–24)
Bilirubin Total: 0.3 mg/dL (ref 0.0–1.2)
CO2: 20 mmol/L (ref 20–29)
Calcium: 9.8 mg/dL (ref 8.7–10.2)
Chloride: 104 mmol/L (ref 96–106)
Creatinine, Ser: 0.87 mg/dL (ref 0.76–1.27)
Globulin, Total: 2.8 g/dL (ref 1.5–4.5)
Glucose: 102 mg/dL — ABNORMAL HIGH (ref 70–99)
Potassium: 4.7 mmol/L (ref 3.5–5.2)
Sodium: 141 mmol/L (ref 134–144)
Total Protein: 7.2 g/dL (ref 6.0–8.5)
eGFR: 105 mL/min/{1.73_m2} (ref >59)

## 2022-09-17 LAB — HEMOGLOBIN A1C
Est. average glucose Bld gHb Est-mCnc: 108 mg/dL
Hgb A1c MFr Bld: 5.4 % (ref 4.8–5.6)

## 2022-09-17 LAB — LIPID PANEL
Chol/HDL Ratio: 11.2 ratio — ABNORMAL HIGH (ref 0.0–5.0)
Cholesterol, Total: 235 mg/dL — ABNORMAL HIGH (ref 100–199)
HDL: 21 mg/dL — ABNORMAL LOW (ref >39)
Triglycerides: 1703 mg/dL (ref 0–149)

## 2022-09-17 LAB — VITAMIN D 25 HYDROXY (VIT D DEFICIENCY, FRACTURES): Vit D, 25-Hydroxy: 12.7 ng/mL — ABNORMAL LOW (ref 30.0–100.0)

## 2022-10-14 ENCOUNTER — Encounter: Payer: Self-pay | Admitting: Dermatology

## 2022-10-14 ENCOUNTER — Ambulatory Visit (INDEPENDENT_AMBULATORY_CARE_PROVIDER_SITE_OTHER): Payer: 59 | Admitting: Dermatology

## 2022-10-14 VITALS — BP 116/77

## 2022-10-14 DIAGNOSIS — L918 Other hypertrophic disorders of the skin: Secondary | ICD-10-CM

## 2022-10-14 DIAGNOSIS — L814 Other melanin hyperpigmentation: Secondary | ICD-10-CM | POA: Diagnosis not present

## 2022-10-14 DIAGNOSIS — D485 Neoplasm of uncertain behavior of skin: Secondary | ICD-10-CM

## 2022-10-14 NOTE — Progress Notes (Signed)
   New Patient Visit   Subjective  Steven Herrera is a 52 y.o. male who presents for the following: Spot of left lower lip x 5-6 months. He has dipped or chewed chewing tobacco and smoked for several years (since age 44) . There is a family history of mouth cancer in his uncle. He also has a mole on his left upper arm that catches on clothing and he would like it checked.   The following portions of the chart were reviewed this encounter and updated as appropriate: medications, allergies, medical history  Review of Systems:  No other skin or systemic complaints except as noted in HPI or Assessment and Plan.  Objective  Well appearing patient in no apparent distress; mood and affect are within normal limits.   A focused examination was performed of the following areas: Face, left arm   Relevant exam findings are noted in the Assessment and Plan.  Left Lower [place] Lip Brown macule     Left Upper Arm Fleshy papule       Assessment & Plan     Neoplasm of uncertain behavior of skin (2) Left Lower [place] Lip  Skin / nail biopsy Type of biopsy: tangential   Informed consent: discussed and consent obtained   Timeout: patient name, date of birth, surgical site, and procedure verified   Procedure prep:  Patient was prepped and draped in usual sterile fashion Prep type:  Isopropyl alcohol Anesthesia: the lesion was anesthetized in a standard fashion   Anesthetic:  1% lidocaine w/ epinephrine 1-100,000 buffered w/ 8.4% NaHCO3 Instrument used: flexible razor blade   Hemostasis achieved with: pressure, aluminum chloride and electrodesiccation   Outcome: patient tolerated procedure well   Post-procedure details: sterile dressing applied and wound care instructions given   Dressing type: bandage and petrolatum    Left Upper Arm  Epidermal / dermal shaving  Informed consent: discussed and consent obtained   Timeout: patient name, date of birth, surgical site, and  procedure verified   Procedure prep:  Patient was prepped and draped in usual sterile fashion Prep type:  Isopropyl alcohol Anesthesia: the lesion was anesthetized in a standard fashion   Anesthetic:  1% lidocaine w/ epinephrine 1-100,000 buffered w/ 8.4% NaHCO3 Instrument used: flexible razor blade   Hemostasis achieved with: pressure, aluminum chloride and electrodesiccation   Outcome: patient tolerated procedure well   Post-procedure details: sterile dressing applied and wound care instructions given   Dressing type: bandage and petrolatum      Return if symptoms worsen or fail to improve.  I, Joanie Coddington, CMA, am acting as scribe for Langston Reusing, MD .   Documentation: I have reviewed the above documentation for accuracy and completeness, and I agree with the above.  Langston Reusing, MD

## 2022-10-14 NOTE — Patient Instructions (Signed)
Shave Excision Benign Lesion Wound Care Instructions  Leave the original bandage on for 24 hours if possible.  If the bandage becomes soaked or soiled before that time, it is OK to remove it and examine the wound.  A small amount of post-operative bleeding is normal.  If excessive bleeding occurs, remove the bandage, place gauze over the site and apply continuous pressure (no peeking) over the area for 20-30 minutes.  If this does not stop the bleeding, try again for 40 minutes.  If this does not work, please call our clinic as soon as possible (even if after-hours).    Twice a day, cleanse the wound with soap and water.  If a thick crust develops you may use a Q-tip dipped into dilute hydrogen peroxide (mix 1:1 with water) to dissolve it.  Hydrogen peroxide can slow the healing process, so use it only as needed.  After washing, apply Vaseline jelly or Polysporin ointment.  For best healing, the wound should be covered with a layer of ointment at all times.  This may mean re-applying the ointment several times a day.  For open wounds, continue until it has healed.    If you have any swelling, keep the area elevated.  Some redness, tenderness and white or yellow material in the wound is normal healing.  If the area becomes very sore and red, or develops a thick yellow-green material (pus), it may be infected; please notify us.    Wound healing continues for up to one year following surgery.  It is not unusual to experience pain in the scar from time to time during the interval.  If the pain becomes severe or the scar thickens, you should notify the office.  A slight amount of redness in a scar is expected for the first six months.  After six months, the redness subsides and the scar will soften and fade.  The color difference becomes less noticeable with time.  If there are any problems, return for a post-op surgery check at your earliest convenience.  Please call our office for any questions or  concerns.  Due to recent changes in healthcare laws, you may see results of your pathology and/or laboratory studies on MyChart before the doctors have had a chance to review them. We understand that in some cases there may be results that are confusing or concerning to you. Please understand that not all results are received at the same time and often the doctors may need to interpret multiple results in order to provide you with the best plan of care or course of treatment. Therefore, we ask that you please give us 2 business days to thoroughly review all your results before contacting the office for clarification. Should we see a critical lab result, you will be contacted sooner.   If You Need Anything After Your Visit  If you have any questions or concerns for your doctor, please call our main line at [phone] If no one answers, please leave a voicemail as directed and we will return your call as soon as possible. Messages left after 4 pm will be answered the following business day.   You may also send us a message via MyChart. We typically respond to MyChart messages within 1-2 business days.  For prescription refills, please ask your pharmacy to contact our office. Our fax number is [phone].  If you have an urgent issue when the clinic is closed that cannot wait until the next business day, you can page   your doctor at the number below.    Please note that while we do our best to be available for urgent issues outside of office hours, we are not available 24/7.   If you have an urgent issue and are unable to reach us, you may choose to seek medical care at your doctor's office, retail clinic, urgent care center, or emergency room.  If you have a medical emergency, please immediately call 911 or go to the emergency department. In the event of inclement weather, please call our main line at [phone] for an update on the status of any delays or closures.  Dermatology Medication  Tips: Please keep the boxes that topical medications come in in order to help keep track of the instructions about where and how to use these. Pharmacies typically print the medication instructions only on the boxes and not directly on the medication tubes.   If your medication is too expensive, please contact our office at [phone] or send us a message through MyChart.   We are unable to tell what your co-pay for medications will be in advance as this is different depending on your insurance coverage. However, we may be able to find a substitute medication at lower cost or fill out paperwork to get insurance to cover a needed medication.   If a prior authorization is required to get your medication covered by your insurance company, please allow us 1-2 business days to complete this process.  Drug prices often vary depending on where the prescription is filled and some pharmacies may offer cheaper prices.  The website www.goodrx.com contains coupons for medications through different pharmacies. The prices here do not account for what the cost may be with help from insurance (it may be cheaper with your insurance), but the website can give you the price if you did not use any insurance.  - You can print the associated coupon and take it with your prescription to the pharmacy.  - You may also stop by our office during regular business hours and pick up a GoodRx coupon card.  - If you need your prescription sent electronically to a different pharmacy, notify our office through [facility] MyChart or by phone at [phone]     

## 2022-10-16 NOTE — Addendum Note (Signed)
Addended by: Terri Piedra on: 10/16/2022 01:14 PM   Modules accepted: Orders

## 2022-10-25 ENCOUNTER — Other Ambulatory Visit (HOSPITAL_COMMUNITY): Payer: Self-pay

## 2022-10-25 ENCOUNTER — Other Ambulatory Visit: Payer: Self-pay

## 2022-10-28 ENCOUNTER — Ambulatory Visit (INDEPENDENT_AMBULATORY_CARE_PROVIDER_SITE_OTHER): Payer: 59 | Admitting: Family Medicine

## 2022-10-28 ENCOUNTER — Encounter: Payer: Self-pay | Admitting: Family Medicine

## 2022-10-28 VITALS — BP 136/89 | HR 73 | Resp 18 | Ht 72.0 in | Wt 200.0 lb

## 2022-10-28 DIAGNOSIS — E559 Vitamin D deficiency, unspecified: Secondary | ICD-10-CM | POA: Diagnosis not present

## 2022-10-28 DIAGNOSIS — E781 Pure hyperglyceridemia: Secondary | ICD-10-CM

## 2022-10-28 DIAGNOSIS — E7800 Pure hypercholesterolemia, unspecified: Secondary | ICD-10-CM

## 2022-10-28 NOTE — Assessment & Plan Note (Signed)
Rechecking vitamin D today.  Patient states that typically his vitamin D does normalize during the summer when he is working outside a lot.  He finished his 5-week course of vitamin D 50,000 units once weekly.

## 2022-10-28 NOTE — Assessment & Plan Note (Signed)
Repeating lipid panel and CMP for hepatic function today after temporary increase in atorvastatin and addition of fenofibrate.  Will adjust medication as needed, patient did tolerate the week of atorvastatin 80 mg well.  It cholesterol still elevated, will send in prescription for atorvastatin 80 mg daily.  If cholesterol at goal, will send in prescription for atorvastatin 40 mg daily.

## 2022-10-28 NOTE — Progress Notes (Signed)
Established Patient Office Visit  Subjective   Patient ID: Steven Herrera, male    DOB: 09/19/1970  Age: 52 y.o. MRN: [id number]  Chief Complaint  Patient presents with   Hyperlipidemia    fasting    HPI DAMATO FREDIEU is a 52 y.o. male presenting today for follow up of hyperlipidemia and vitamin D deficiency. Hyperlipidemia: Most recent lab work on 09/02/2022 indicated triglycerides 1703, total cholesterol elevated at 235, LDL unable to be calculated given incredibly high triglyceride level.  Increased atorvastatin to 80 mg daily and added fenofibrate 50 mg daily.  Prescription for 80 mg of atorvastatin was not sent in, so patient doubled up on his 40 mg tablets for about a week but then went back to 40 mg daily.  He also reflected on just prior to his last lipid test and realized that he had gone into a bottle of [place] and had not been eating well.  He has significantly changed his diet since that time, has cut out alcohol and has increased his fiber/vegetable intake.  He did tolerate the increase in atorvastatin well with no myalgias or significant side effects.   The ASCVD Risk score (Arnett DK, et al., 2019) failed to calculate for the following reasons:   The patient has a prior MI or stroke diagnosis Vitamin D Deficiency: Given low vitamin D on last lab work, initiated 5-week course of vitamin D prescription.  Finished prescription and now works outside a lot.  ROS Negative unless otherwise noted in HPI   Objective:     BP 136/89 (BP Location: Left Arm, Patient Position: Sitting, Cuff Size: Normal)   Pulse 73   Resp 18   Ht 6' (1.829 m)   Wt 200 lb (90.7 kg)   SpO2 95%   BMI 27.12 kg/m   Physical Exam Constitutional:      General: He is not in acute distress.    Appearance: Normal appearance.  HENT:     Head: Normocephalic and atraumatic.  Cardiovascular:     Rate and Rhythm: Normal rate and regular rhythm.     Heart sounds: No murmur heard.    No friction  rub. No gallop.  Pulmonary:     Effort: Pulmonary effort is normal. No respiratory distress.     Breath sounds: Normal breath sounds. No wheezing, rhonchi or rales.  Skin:    General: Skin is warm and dry.  Neurological:     Mental Status: He is alert and oriented to person, place, and time.  Psychiatric:        Mood and Affect: Mood normal.     Assessment & Plan:  Hypertriglyceridemia Assessment & Plan: Patient tolerating addition of fenofibrate well.  Possible that elevated triglycerides on last check were also due to dietary intake and alcohol consumption.  Encourage patient to continue with the dietary changes that he has made including limiting alcohol.  Rechecking lipid panel today.  Can continue fenofibrate 50 mg daily or if necessary increase dose to 145 mg daily.  Orders: -     Comprehensive metabolic panel; Future -     Lipid panel; Future  Vitamin D deficiency Assessment & Plan: Rechecking vitamin D today.  Patient states that typically his vitamin D does normalize during the summer when he is working outside a lot.  He finished his 5-week course of vitamin D 50,000 units once weekly.  Orders: -     VITAMIN D 25 Hydroxy (Vit-D Deficiency, Fractures); Future  High cholesterol  Assessment & Plan: Repeating lipid panel and CMP for hepatic function today after temporary increase in atorvastatin and addition of fenofibrate.  Will adjust medication as needed, patient did tolerate the week of atorvastatin 80 mg well.  It cholesterol still elevated, will send in prescription for atorvastatin 80 mg daily.  If cholesterol at goal, will send in prescription for atorvastatin 40 mg daily.  Orders: -     Comprehensive metabolic panel; Future -     Lipid panel; Future    Return in about 18 weeks (around 03/03/2023) for follow-up for HLD, fasting blood work 1 week before.    Melida Quitter, PA

## 2022-10-28 NOTE — Assessment & Plan Note (Signed)
Patient tolerating addition of fenofibrate well.  Possible that elevated triglycerides on last check were also due to dietary intake and alcohol consumption.  Encourage patient to continue with the dietary changes that he has made including limiting alcohol.  Rechecking lipid panel today.  Can continue fenofibrate 50 mg daily or if necessary increase dose to 145 mg daily.

## 2022-10-29 ENCOUNTER — Other Ambulatory Visit: Payer: Self-pay | Admitting: Family Medicine

## 2022-10-29 ENCOUNTER — Other Ambulatory Visit (HOSPITAL_COMMUNITY): Payer: Self-pay

## 2022-10-29 DIAGNOSIS — E781 Pure hyperglyceridemia: Secondary | ICD-10-CM

## 2022-10-29 DIAGNOSIS — Z0000 Encounter for general adult medical examination without abnormal findings: Secondary | ICD-10-CM

## 2022-10-29 LAB — COMPREHENSIVE METABOLIC PANEL
ALT: 20 IU/L (ref 0–44)
AST: 24 IU/L (ref 0–40)
Albumin/Globulin Ratio: 1.8 (ref 1.2–2.2)
Albumin: 4.4 g/dL (ref 4.1–5.1)
Alkaline Phosphatase: 78 IU/L (ref 44–121)
BUN/Creatinine Ratio: 16 (ref 9–20)
BUN: 12 mg/dL (ref 6–24)
Bilirubin Total: 0.3 mg/dL (ref 0.0–1.2)
CO2: 20 mmol/L (ref 20–29)
Calcium: 9.2 mg/dL (ref 8.7–10.2)
Chloride: 108 mmol/L — ABNORMAL HIGH (ref 96–106)
Creatinine, Ser: 0.77 mg/dL (ref 0.76–1.27)
Globulin, Total: 2.5 g/dL (ref 1.5–4.5)
Glucose: 84 mg/dL (ref 70–99)
Potassium: 4.2 mmol/L (ref 3.5–5.2)
Sodium: 143 mmol/L (ref 134–144)
Total Protein: 6.9 g/dL (ref 6.0–8.5)
eGFR: 109 mL/min/{1.73_m2} (ref >59)

## 2022-10-29 LAB — LIPID PANEL
Chol/HDL Ratio: 5.2 ratio — ABNORMAL HIGH (ref 0.0–5.0)
Cholesterol, Total: 151 mg/dL (ref 100–199)
HDL: 29 mg/dL — ABNORMAL LOW (ref >39)
LDL Chol Calc (NIH): 63 mg/dL (ref 0–99)
Triglycerides: 377 mg/dL — ABNORMAL HIGH (ref 0–149)
VLDL Cholesterol Cal: 59 mg/dL — ABNORMAL HIGH (ref 5–40)

## 2022-10-29 LAB — VITAMIN D 25 HYDROXY (VIT D DEFICIENCY, FRACTURES): Vit D, 25-Hydroxy: 30.3 ng/mL (ref 30.0–100.0)

## 2022-10-29 MED ORDER — FENOFIBRATE 50 MG PO CAPS
1.0000 | ORAL_CAPSULE | Freq: Every day | ORAL | 1 refills | Status: DC
  Filled 2022-10-29: qty 90, 90d supply, fill #0

## 2022-10-29 MED ORDER — ATORVASTATIN CALCIUM 40 MG PO TABS
40.0000 mg | ORAL_TABLET | Freq: Every day | ORAL | 1 refills | Status: DC
Start: 2022-10-29 — End: 2023-03-10
  Filled 2022-10-29 – 2022-12-24 (×2): qty 90, 90d supply, fill #0

## 2022-11-15 ENCOUNTER — Telehealth (HOSPITAL_BASED_OUTPATIENT_CLINIC_OR_DEPARTMENT_OTHER): Payer: 59 | Admitting: Psychiatry

## 2022-11-15 ENCOUNTER — Encounter (HOSPITAL_COMMUNITY): Payer: Self-pay | Admitting: Psychiatry

## 2022-11-15 ENCOUNTER — Other Ambulatory Visit: Payer: Self-pay

## 2022-11-15 ENCOUNTER — Other Ambulatory Visit (HOSPITAL_COMMUNITY): Payer: Self-pay

## 2022-11-15 VITALS — Wt 200.0 lb

## 2022-11-15 DIAGNOSIS — F419 Anxiety disorder, unspecified: Secondary | ICD-10-CM | POA: Diagnosis not present

## 2022-11-15 DIAGNOSIS — F319 Bipolar disorder, unspecified: Secondary | ICD-10-CM

## 2022-11-15 MED ORDER — CLONAZEPAM 0.5 MG PO TABS
ORAL_TABLET | ORAL | 2 refills | Status: DC
Start: 2022-11-15 — End: 2023-02-19
  Filled 2022-11-15: qty 75, 25d supply, fill #0
  Filled 2022-12-24: qty 75, 25d supply, fill #1
  Filled 2023-01-30: qty 75, 25d supply, fill #2

## 2022-11-15 MED ORDER — QUETIAPINE FUMARATE 50 MG PO TABS
100.0000 mg | ORAL_TABLET | Freq: Every day | ORAL | 2 refills | Status: DC
Start: 2022-11-15 — End: 2023-02-19
  Filled 2022-11-15: qty 90, 30d supply, fill #0
  Filled 2022-12-24: qty 90, 30d supply, fill #1
  Filled 2023-01-30: qty 90, 30d supply, fill #2

## 2022-11-15 NOTE — Progress Notes (Signed)
[facility] MD Virtual Progress Note   Patient Location: Home Provider Location: Home Office  I connect with patient by telephone and verified that I am speaking with correct person by using two identifiers. I discussed the limitations of evaluation and management by telemedicine and the availability of in person appointments. I also discussed with the patient that there may be a patient responsible charge related to this service. The patient expressed understanding and agreed to proceed.  Steven Herrera [id number] 52 y.o.  11/15/2022 10:15 AM  History of Present Illness:  Patient is evaluated by phone session.  He has been doing okay with the medication.  He is sleeping good.  He still have moments of irritability, frustration and anger but denies any mania, psychosis or any hallucination.  He is taking Klonopin 0.5 mg twice a day and sometimes he takes the third medicine if he has to go to the grocery stores.  He does not like around people and he gets easily annoyed.  Lately he is trying to keep himself busy at work.  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  He reported work is so-so but at least he is busy.  His wife works for [employer].  Denies any hallucination, paranoia, suicidal thoughts.  Recently had a visit with his primary care and found to have his triglycerides were very high.  Initially it was more than 1500 but not taking fenofibrate and it came down to 377.  He admitted not eating healthy food and not exercising.  Now he is considering to change his diet and going to start eat salad and no cheeseburger.  He has mild tremors but stable and not worsening.  He realized his tremor is not any get better because prior history of intensive labor work.  His tremors are chronic.  He is getting along with his family members.  Denies any suicidal  thoughts.    Past Psychiatric History: H/O briefly seeing therapist at [facility] in young age.  H/O mood swing, anger, agitation, hyperactivity and running away.  Use acid, LSD, cocaine, marijuana and heavy drinking.  Tried Zoloft, Lexapro, melatonin and Xanax did not like it. Took Paxil and Klonopin and Wellbutrin from PCP. We tried Lamictal.    Outpatient Encounter Medications as of 11/15/2022  Medication Sig   aspirin EC 81 MG tablet Take 81 mg by mouth daily with lunch.    atorvastatin (LIPITOR) 40 MG tablet Take 1 tablet (40 mg total) by mouth daily at 6 PM.   clonazePAM (KLONOPIN) 0.5 MG tablet Take one tablet by mouth twice a daily and 3rd tablet as needed for severe anxiety   Fenofibrate 50 MG CAPS Take 1 capsule (50 mg total) by mouth daily.   Menthol, Topical Analgesic, (ICY HOT EX) Apply 1 application topically 3 (three) times daily as needed (pain).    Multiple Vitamin (MULTIVITAMIN WITH MINERALS) TABS tablet Take 1 tablet by mouth daily with lunch.   omega-3 acid ethyl esters (LOVAZA) 1 g capsule TAKE 2 CAPSULES BY MOUTH 2 TIMES DAILY.   omeprazole (PRILOSEC) 20 MG capsule Take 1 capsule (20 mg total) by mouth daily.   QUEtiapine (SEROQUEL) 50 MG tablet Take 3 tablets (150 mg total) by mouth at bedtime.   Vitamin D, Ergocalciferol, (DRISDOL) 1.25 MG (50000 UNIT) CAPS capsule Take 1 capsule (50,000 Units  total) by mouth every 7 (seven) days.   No facility-administered encounter medications on file as of 11/15/2022.    Recent Results (from the past 2160 hour(s))  VITAMIN D 25 Hydroxy (Vit-D Deficiency, Fractures)     Status: Abnormal   Collection Time: 09/02/22 11:25 AM  Result Value Ref Range   Vit D, 25-Hydroxy 12.7 (L) 30.0 - 100.0 ng/mL    Comment: Vitamin D deficiency has been defined by the Institute of Medicine and an Endocrine Society practice guideline as a level of serum 25-OH vitamin D less than 20 ng/mL (1,2). The Endocrine Society went on to further define vitamin  D insufficiency as a level between 21 and 29 ng/mL (2). 1. IOM (Institute of Medicine). 2010. Dietary reference    intakes for calcium and D. [place]: The    [employer]. 2. Holick MF, Binkley [place], Bischoff-Ferrari HA, et al.    Evaluation, treatment, and prevention of vitamin D    deficiency: an Endocrine Society clinical practice    guideline. JCEM. 2011 Jul; 96(7):1911-30.   Lipid panel     Status: Abnormal   Collection Time: 09/02/22 11:25 AM  Result Value Ref Range   Cholesterol, Total 235 (H) 100 - 199 mg/dL   Triglycerides 1,610 (HH) 0 - 149 mg/dL    Comment: Results confirmed on dilution.    HDL 21 (L) >39 mg/dL   VLDL Cholesterol Cal Comment (A) 5 - 40 mg/dL    Comment: The calculation for the VLDL cholesterol is not valid when triglyceride level is >800 mg/dL.    LDL Chol Calc (NIH) Comment (A) 0 - 99 mg/dL    Comment: Triglyceride result indicated is too high for an accurate LDL cholesterol estimation.    Chol/HDL Ratio 11.2 (H) 0.0 - 5.0 ratio    Comment:                                   T. Chol/HDL Ratio                                             Men  Women                               1/2 Avg.Risk  3.4    3.3                                   Avg.Risk  5.0    4.4                                2X Avg.Risk  9.6    7.1                                3X Avg.Risk 23.4   11.0   Hemoglobin A1c     Status: None   Collection Time: 09/02/22 11:25 AM  Result Value Ref Range   Hgb A1c MFr Bld 5.4 4.8 - 5.6 %    Comment:          Prediabetes: 5.7 -  6.4          Diabetes: >6.4          Glycemic control for adults with diabetes: <7.0    Est. average glucose Bld gHb Est-mCnc 108 mg/dL  Comprehensive metabolic panel     Status: Abnormal   Collection Time: 09/02/22 11:25 AM  Result Value Ref Range   Glucose 102 (H) 70 - 99 mg/dL   BUN 15 6 - 24 mg/dL   Creatinine, Ser 0.98 0.76 - 1.27 mg/dL   eGFR 119 >14 NW/GNF/6.21   BUN/Creatinine Ratio 17 9 -  20   Sodium 141 134 - 144 mmol/L   Potassium 4.7 3.5 - 5.2 mmol/L   Chloride 104 96 - 106 mmol/L   CO2 20 20 - 29 mmol/L   Calcium 9.8 8.7 - 10.2 mg/dL   Total Protein 7.2 6.0 - 8.5 g/dL   Albumin 4.4 4.1 - 5.1 g/dL   Globulin, Total 2.8 1.5 - 4.5 g/dL   Albumin/Globulin Ratio 1.6 1.2 - 2.2   Bilirubin Total 0.3 0.0 - 1.2 mg/dL   Alkaline Phosphatase 111 44 - 121 IU/L   AST CANCELED IU/L    Comment: Test not performed. Due to technical problems in testing, a valid result could not be obtained. The remaining specimen was not sufficient for additional testing.  Result canceled by the ancillary.    ALT 33 0 - 44 IU/L  CBC with Differential/Platelet     Status: Abnormal   Collection Time: 09/02/22 11:25 AM  Result Value Ref Range   WBC 8.1 3.4 - 10.8 x10E3/uL   RBC 4.96 4.14 - 5.80 x10E6/uL   Hemoglobin 16.5 13.0 - 17.7 g/dL   Hematocrit 30.8 65.7 - 51.0 %   MCV 95 79 - 97 fL   MCH 33.3 (H) 26.6 - 33.0 pg   MCHC 35.2 31.5 - 35.7 g/dL   RDW 84.6 96.2 - 95.2 %   Platelets 251 150 - 450 x10E3/uL   Neutrophils 53 Not Estab. %   Lymphs 32 Not Estab. %   Monocytes 12 Not Estab. %   Eos 2 Not Estab. %   Basos 1 Not Estab. %   Neutrophils Absolute 4.3 1.4 - 7.0 x10E3/uL   Lymphocytes Absolute 2.5 0.7 - 3.1 x10E3/uL   Monocytes Absolute 1.0 (H) 0.1 - 0.9 x10E3/uL   EOS (ABSOLUTE) 0.2 0.0 - 0.4 x10E3/uL   Basophils Absolute 0.1 0.0 - 0.2 x10E3/uL   Immature Granulocytes 0 Not Estab. %   Immature Grans (Abs) 0.0 0.0 - 0.1 x10E3/uL  VITAMIN D 25 Hydroxy (Vit-D Deficiency, Fractures)     Status: None   Collection Time: 10/28/22 10:31 AM  Result Value Ref Range   Vit D, 25-Hydroxy 30.3 30.0 - 100.0 ng/mL    Comment: Vitamin D deficiency has been defined by the Institute of Medicine and an Endocrine Society practice guideline as a level of serum 25-OH vitamin D less than 20 ng/mL (1,2). The Endocrine Society went on to further define vitamin D insufficiency as a level between 21 and  29 ng/mL (2). 1. IOM (Institute of Medicine). 2010. Dietary reference    intakes for calcium and D. [place]: The    [employer]. 2. Holick MF, Binkley [place], Bischoff-Ferrari HA, et al.    Evaluation, treatment, and prevention of vitamin D    deficiency: an Endocrine Society clinical practice    guideline. JCEM. 2011 Jul; 96(7):1911-30.   Lipid panel     Status: Abnormal  Collection Time: 10/28/22 10:31 AM  Result Value Ref Range   Cholesterol, Total 151 100 - 199 mg/dL   Triglycerides 409 (H) 0 - 149 mg/dL   HDL 29 (L) >81 mg/dL   VLDL Cholesterol Cal 59 (H) 5 - 40 mg/dL   LDL Chol Calc (NIH) 63 0 - 99 mg/dL   Chol/HDL Ratio 5.2 (H) 0.0 - 5.0 ratio    Comment:                                   T. Chol/HDL Ratio                                             Men  Women                               1/2 Avg.Risk  3.4    3.3                                   Avg.Risk  5.0    4.4                                2X Avg.Risk  9.6    7.1                                3X Avg.Risk 23.4   11.0   Comprehensive metabolic panel     Status: Abnormal   Collection Time: 10/28/22 10:31 AM  Result Value Ref Range   Glucose 84 70 - 99 mg/dL   BUN 12 6 - 24 mg/dL   Creatinine, Ser 1.91 0.76 - 1.27 mg/dL   eGFR 478 >29 FA/OZH/0.86   BUN/Creatinine Ratio 16 9 - 20   Sodium 143 134 - 144 mmol/L   Potassium 4.2 3.5 - 5.2 mmol/L   Chloride 108 (H) 96 - 106 mmol/L   CO2 20 20 - 29 mmol/L   Calcium 9.2 8.7 - 10.2 mg/dL   Total Protein 6.9 6.0 - 8.5 g/dL   Albumin 4.4 4.1 - 5.1 g/dL   Globulin, Total 2.5 1.5 - 4.5 g/dL   Albumin/Globulin Ratio 1.8 1.2 - 2.2   Bilirubin Total 0.3 0.0 - 1.2 mg/dL   Alkaline Phosphatase 78 44 - 121 IU/L   AST 24 0 - 40 IU/L   ALT 20 0 - 44 IU/L     Psychiatric Specialty Exam: Physical Exam  Review of Systems  Neurological:  Positive for tremors.    Weight 200 lb (90.7 kg).There is no height or weight on file to calculate BMI.  General  Appearance: NA  Eye Contact:  NA  Speech:  Normal Rate  Volume:  Normal  Mood:  Euthymic  Affect:  NA  Thought Process:  Goal Directed  Orientation:  Full (Time, Place, and Person)  Thought Content:  Logical  Suicidal Thoughts:  No  Homicidal Thoughts:  No  Memory:  Immediate;   Good Recent;   Good Remote;   Fair  Judgement:  Intact  Insight:  Present  Psychomotor Activity:  Tremor  Concentration:  Concentration: Fair and Attention Span: Fair  Recall:  Good  Fund of Knowledge:  Good  Language:  Good  Akathisia:  No  Handed:  Right  AIMS (if indicated):     Assets:  Communication Skills Desire for Improvement Housing Resilience Social Support Talents/Skills Transportation  ADL's:  Intact  Cognition:  WNL  Sleep:  7 hrs     Assessment/Plan: Bipolar I disorder (HCC) - Plan: QUEtiapine (SEROQUEL) 50 MG tablet  Anxiety - Plan: clonazePAM (KLONOPIN) 0.5 MG tablet  I reviewed blood work results.  His triglycerides are very high and not taking fenofibrate that helps some.  We discussed medication side effects especially Seroquel also increased triglycerides.  I recommend to try to cut down the Seroquel though patient is somewhat reluctant but willing to try.  I also discussed watching his calorie intake, regular exercise, eating healthy habit.  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  He will try reducing Seroquel 100 mg at bedtime however if he noticed symptoms coming back then he can take the 150.  Continue Klonopin 0.5 mg 2 times a day.  Patient not interested in therapy.  His tremors are chronic but he is stable.  Recommend to call us back if is any question or any concern.  Follow-up in 3 months.   Follow Up Instructions:     I discussed the assessment and treatment plan with the patient. The patient was provided an opportunity to ask questions and all were answered. The patient agreed with the plan and demonstrated an  understanding of the instructions.   The patient was advised to call back or seek an in-person evaluation if the symptoms worsen or if the condition fails to improve as anticipated.    Collaboration of Care: Other provider involved in patient's care AEB notes are available in epic to review.  Patient/Guardian was advised Release of Information must be obtained prior to any record release in order to collaborate their care with an outside provider. Patient/Guardian was advised if they have not already done so to contact the registration department to sign all necessary forms in order for [place] to release information regarding their care.   Consent: Patient/Guardian gives verbal consent for treatment and assignment of benefits for services provided during this visit. Patient/Guardian expressed understanding and agreed to proceed.     I provided 23 minutes of non face to face time during this encounter.  Note: This document was prepared by [employer] voice dictation technology and any errors that results from this process are unintentional.    Cleotis Nipper, MD 11/15/2022

## 2022-12-24 ENCOUNTER — Other Ambulatory Visit (HOSPITAL_COMMUNITY): Payer: Self-pay

## 2022-12-24 ENCOUNTER — Other Ambulatory Visit: Payer: Self-pay

## 2023-01-30 ENCOUNTER — Other Ambulatory Visit (HOSPITAL_COMMUNITY): Payer: Self-pay

## 2023-01-30 ENCOUNTER — Other Ambulatory Visit: Payer: Self-pay

## 2023-02-07 ENCOUNTER — Telehealth (HOSPITAL_COMMUNITY): Payer: 59 | Admitting: Psychiatry

## 2023-02-19 ENCOUNTER — Telehealth (HOSPITAL_COMMUNITY): Payer: 59 | Admitting: Psychiatry

## 2023-02-19 ENCOUNTER — Other Ambulatory Visit (HOSPITAL_COMMUNITY): Payer: Self-pay

## 2023-02-19 ENCOUNTER — Encounter (HOSPITAL_COMMUNITY): Payer: Self-pay | Admitting: Psychiatry

## 2023-02-19 VITALS — Wt 200.0 lb

## 2023-02-19 DIAGNOSIS — F319 Bipolar disorder, unspecified: Secondary | ICD-10-CM | POA: Diagnosis not present

## 2023-02-19 DIAGNOSIS — F419 Anxiety disorder, unspecified: Secondary | ICD-10-CM | POA: Diagnosis not present

## 2023-02-19 MED ORDER — CLONAZEPAM 0.5 MG PO TABS
ORAL_TABLET | ORAL | 2 refills | Status: DC
Start: 2023-02-19 — End: 2023-05-19
  Filled 2023-02-19: qty 75, fill #0
  Filled 2023-03-12: qty 75, 25d supply, fill #0
  Filled 2023-04-24: qty 75, 25d supply, fill #1

## 2023-02-19 MED ORDER — QUETIAPINE FUMARATE 50 MG PO TABS
50.0000 mg | ORAL_TABLET | Freq: Every day | ORAL | 0 refills | Status: DC
Start: 2023-02-19 — End: 2023-05-19
  Filled 2023-02-19 – 2023-04-24 (×2): qty 90, 90d supply, fill #0

## 2023-02-19 NOTE — Progress Notes (Signed)
[facility] MD Virtual Progress Note   Patient Location: Home Provider Location: Home Office  I connect with patient by video and verified that I am speaking with correct person by using two identifiers. I discussed the limitations of evaluation and management by telemedicine and the availability of in person appointments. I also discussed with the patient that there may be a patient responsible charge related to this service. The patient expressed understanding and agreed to proceed.  Steven Herrera [id number] 52 y.o.  02/19/2023 10:55 AM  History of Present Illness:  Patient is evaluated by video session.  He cut down the Seroquel to only taking 50 mg after he was recommended due to increased triglycerides.  His appointment coming up on September 9 for blood work.  He admitted sleep has been an issue as wakes of the night and hard to go back to sleep.  He feels may have a lot of racing thoughts but has not noticed any worsening of mood swings, irritability, anger, frustration or mania.  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  He reported anxiety is manageable and is stable since he does not go outside and does not like to go to stores and crowded places.  He noticed he may have lost 3 pounds but has not checked weight in a while.  Since he find out he has a high triglyceride he changed his diet.  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  Denies any mania, psychosis, hallucination, suicidal thoughts.  He is taking Klonopin most of the time twice a day but very rare he needs extra pill when he feels very nervous and anxious.  He lives with his wife who works.  Denies drinking or using any illegal substances.  Past Psychiatric History: H/O briefly seeing therapist at [facility] in young age.  H/O mood swing, anger, agitation,  hyperactivity and running away.  Use acid, LSD, cocaine, marijuana and heavy drinking.  Tried Zoloft, Lexapro, melatonin and Xanax did not like it. Took Paxil and Klonopin and Wellbutrin from PCP. We tried Lamictal.    Outpatient Encounter Medications as of 02/19/2023  Medication Sig   aspirin EC 81 MG tablet Take 81 mg by mouth daily with lunch.    atorvastatin (LIPITOR) 40 MG tablet Take 1 tablet (40 mg total) by mouth daily at 6 PM.   clonazePAM (KLONOPIN) 0.5 MG tablet Take one tablet by mouth twice a daily and 3rd tablet as needed for severe anxiety   Fenofibrate 50 MG CAPS Take 1 capsule (50 mg total) by mouth daily.   Menthol, Topical Analgesic, (ICY HOT EX) Apply 1 application topically 3 (three) times daily as needed (pain).    Multiple Vitamin (MULTIVITAMIN WITH MINERALS) TABS tablet Take 1 tablet by mouth daily with lunch.   omega-3 acid ethyl esters (LOVAZA) 1 g capsule TAKE 2 CAPSULES BY MOUTH 2 TIMES DAILY.   omeprazole (PRILOSEC) 20 MG capsule Take 1 capsule (20 mg total) by mouth daily.   QUEtiapine (SEROQUEL) 50 MG tablet Take 2-3 tablets (100-150 mg total) by mouth at bedtime.   Vitamin D, Ergocalciferol, (DRISDOL) 1.25 MG (50000 UNIT) CAPS capsule Take 1 capsule (50,000 Units total) by mouth every 7 (seven) days.   No facility-administered encounter medications on file as of 02/19/2023.  No results found for this or any previous visit (from the past 2160 hour(s)).   Psychiatric Specialty Exam: Physical Exam  Review of Systems  Weight 200 lb (90.7 kg).There is no height or weight on file to calculate BMI.  General Appearance: Casual  Eye Contact:  Good  Speech:  Clear and Coherent and Normal Rate  Volume:  Normal  Mood:  Euthymic  Affect:  Appropriate  Thought Process:  Goal Directed  Orientation:  Full (Time, Place, and Person)  Thought Content:  WDL  Suicidal Thoughts:  No  Homicidal Thoughts:  No  Memory:  Immediate;   Good Recent;   Good Remote;   Good   Judgement:  Fair  Insight:  Present  Psychomotor Activity:  Tremor  Concentration:  Concentration: Fair and Attention Span: Fair  Recall:  Good  Fund of Knowledge:  Good  Language:  Good  Akathisia:  No  Handed:  Right  AIMS (if indicated):     Assets:  Communication Skills Desire for Improvement Housing Social Support Talents/Skills  ADL's:  Intact  Cognition:  WNL  Sleep:  fair     Assessment/Plan: Bipolar I disorder (HCC) - Plan: QUEtiapine (SEROQUEL) 50 MG tablet  Anxiety - Plan: clonazePAM (KLONOPIN) 0.5 MG tablet  Patient is taking Seroquel 50 mg since he was concerned about high triglycerides.  He has appointment coming up next month for blood work.  He admitted some issue with the sleep but otherwise mood is stable and he denies any mania, agitation, anger, frustration.  I encouraged to consider sleep study since he had tried multiple medication in the past and they did not work.  If his triglycerides remains unchanged then we may consider going up to Seroquel 100 mg. Other possibilities are Thorazine.  Patient has history of using drugs in the past.  He promised that he look into sleep study and discuss with his primary care.  Recommend to call us back if is any question or any concern.  Emphasis given to keep this appointment with his physician for blood work.  Follow up in 3 months  Follow Up Instructions:     I discussed the assessment and treatment plan with the patient. The patient was provided an opportunity to ask questions and all were answered. The patient agreed with the plan and demonstrated an understanding of the instructions.   The patient was advised to call back or seek an in-person evaluation if the symptoms worsen or if the condition fails to improve as anticipated.    Collaboration of Care: Other provider involved in patient's care AEB notes are available in epic to review.  Patient/Guardian was advised Release of Information must be obtained prior  to any record release in order to collaborate their care with an outside provider. Patient/Guardian was advised if they have not already done so to contact the registration department to sign all necessary forms in order for [place] to release information regarding their care.   Consent: Patient/Guardian gives verbal consent for treatment and assignment of benefits for services provided during this visit. Patient/Guardian expressed understanding and agreed to proceed.     I provided 24 minutes of non face to face time during this encounter.  Note: This document was prepared by [employer] voice dictation technology and any errors that results from this process are unintentional.    Cleotis Nipper, MD 02/19/2023

## 2023-03-03 ENCOUNTER — Other Ambulatory Visit: Payer: 59

## 2023-03-03 ENCOUNTER — Other Ambulatory Visit: Payer: Self-pay | Admitting: Family Medicine

## 2023-03-03 ENCOUNTER — Ambulatory Visit: Payer: Commercial Managed Care - PPO | Admitting: Family Medicine

## 2023-03-03 DIAGNOSIS — Z720 Tobacco use: Secondary | ICD-10-CM | POA: Diagnosis not present

## 2023-03-03 DIAGNOSIS — E7800 Pure hypercholesterolemia, unspecified: Secondary | ICD-10-CM | POA: Diagnosis not present

## 2023-03-04 LAB — CBC WITH DIFFERENTIAL/PLATELET
Basophils Absolute: 0.1 10*3/uL (ref 0.0–0.2)
Basos: 1 %
EOS (ABSOLUTE): 0.2 10*3/uL (ref 0.0–0.4)
Eos: 2 %
Hematocrit: 45.5 % (ref 37.5–51.0)
Hemoglobin: 15.9 g/dL (ref 13.0–17.7)
Immature Grans (Abs): 0 10*3/uL (ref 0.0–0.1)
Immature Granulocytes: 0 %
Lymphocytes Absolute: 2.2 10*3/uL (ref 0.7–3.1)
Lymphs: 24 %
MCH: 33.2 pg — ABNORMAL HIGH (ref 26.6–33.0)
MCHC: 34.9 g/dL (ref 31.5–35.7)
MCV: 95 fL (ref 79–97)
Monocytes Absolute: 1.1 10*3/uL — ABNORMAL HIGH (ref 0.1–0.9)
Monocytes: 12 %
Neutrophils Absolute: 5.5 10*3/uL (ref 1.4–7.0)
Neutrophils: 61 %
Platelets: 232 10*3/uL (ref 150–450)
RBC: 4.79 x10E6/uL (ref 4.14–5.80)
RDW: 12.7 % (ref 11.6–15.4)
WBC: 9.1 10*3/uL (ref 3.4–10.8)

## 2023-03-04 LAB — LIPID PANEL
Chol/HDL Ratio: 4.6 ratio (ref 0.0–5.0)
Cholesterol, Total: 142 mg/dL (ref 100–199)
HDL: 31 mg/dL — ABNORMAL LOW (ref >39)
LDL Chol Calc (NIH): 83 mg/dL (ref 0–99)
Triglycerides: 157 mg/dL — ABNORMAL HIGH (ref 0–149)
VLDL Cholesterol Cal: 28 mg/dL (ref 5–40)

## 2023-03-04 LAB — COMPREHENSIVE METABOLIC PANEL
ALT: 21 IU/L (ref 0–44)
AST: 23 IU/L (ref 0–40)
Albumin: 4.4 g/dL (ref 4.1–5.1)
Alkaline Phosphatase: 87 IU/L (ref 44–121)
BUN/Creatinine Ratio: 18 (ref 9–20)
BUN: 16 mg/dL (ref 6–24)
Bilirubin Total: 0.3 mg/dL (ref 0.0–1.2)
CO2: 23 mmol/L (ref 20–29)
Calcium: 9.5 mg/dL (ref 8.7–10.2)
Chloride: 106 mmol/L (ref 96–106)
Creatinine, Ser: 0.88 mg/dL (ref 0.76–1.27)
Globulin, Total: 2.3 g/dL (ref 1.5–4.5)
Glucose: 99 mg/dL (ref 70–99)
Potassium: 4.4 mmol/L (ref 3.5–5.2)
Sodium: 140 mmol/L (ref 134–144)
Total Protein: 6.7 g/dL (ref 6.0–8.5)
eGFR: 105 mL/min/{1.73_m2} (ref >59)

## 2023-03-10 ENCOUNTER — Ambulatory Visit: Payer: 59 | Admitting: Family Medicine

## 2023-03-10 ENCOUNTER — Other Ambulatory Visit (HOSPITAL_COMMUNITY): Payer: Self-pay

## 2023-03-10 ENCOUNTER — Other Ambulatory Visit: Payer: Self-pay

## 2023-03-10 ENCOUNTER — Encounter: Payer: Self-pay | Admitting: Family Medicine

## 2023-03-10 DIAGNOSIS — E781 Pure hyperglyceridemia: Secondary | ICD-10-CM

## 2023-03-10 DIAGNOSIS — K219 Gastro-esophageal reflux disease without esophagitis: Secondary | ICD-10-CM | POA: Diagnosis not present

## 2023-03-10 MED ORDER — OMEPRAZOLE 20 MG PO CPDR
20.0000 mg | DELAYED_RELEASE_CAPSULE | Freq: Every day | ORAL | 1 refills | Status: DC
  Filled 2023-03-10: qty 90, 90d supply, fill #0
  Filled 2023-06-20: qty 90, 90d supply, fill #1

## 2023-03-10 MED ORDER — ATORVASTATIN CALCIUM 40 MG PO TABS
40.0000 mg | ORAL_TABLET | Freq: Every day | ORAL | 1 refills | Status: AC
  Filled 2023-03-10 – 2023-03-12 (×2): qty 90, 90d supply, fill #0
  Filled 2023-06-20: qty 90, 90d supply, fill #1

## 2023-03-10 NOTE — Patient Instructions (Signed)
We will continue to keep an eye on your cholesterol levels.  There may be an option in the future to go back to the effective dose of Seroquel so that you are sleeping well but increase your dose of atorvastatin to keep your triglyceride levels lower, or restart the fenofibrate to keep your triglyceride levels lower.  It is always a comparison of the risks and benefits, so we will continue to have those discussions as time goes on and adjust accordingly!

## 2023-03-10 NOTE — Assessment & Plan Note (Signed)
Triglycerides improved from 377 to 157 and HDL slightly improved to 31.  Last lipid panel: LDL 83, HDL 31, triglycerides 157.  Encouraged to continue limiting dietary intake of trans and saturated fats and alcohol consumption.  Continue atorvastatin 40 mg daily.  We discussed the option of increasing to atorvastatin 80 mg daily so that he can take the effective dose of Seroquel, patient declines given concerns of atorvastatin and memory loss.  Will continue to monitor.  In the future, may consider switching atorvastatin 40 mg to rosuvastatin 20 mg.

## 2023-03-10 NOTE — Progress Notes (Signed)
Established Patient Office Visit  Subjective   Patient ID: Steven Herrera, male    DOB: 1970-07-04  Age: 52 y.o. MRN: [id number]  Chief Complaint  Patient presents with   Hyperlipidemia    HPI Steven Herrera is a 52 y.o. male presenting today for follow up of hyperlipidemia. Hyperlipidemia: tolerating atorvastatin well with no myalgias or significant side effects.  He stopped taking fenofibrate after he finished the initial bottle.  Currently consuming a general diet, he has cut back on how many cheeseburgers he eats and is eating more salads.  He also endorses cutting back on how much whiskey he has.  His psychiatrist told him that Seroquel can increase triglyceride levels, so he has cut back on how much Seroquel he takes and is not sleeping as well.  He does not want to increase atorvastatin because he has concerns of lengths between atorvastatin and memory loss. The ASCVD Risk score (Arnett DK, et al., 2019) failed to calculate for the following reasons:   The patient has a prior MI or stroke diagnosis  Outpatient Medications Prior to Visit  Medication Sig   aspirin EC 81 MG tablet Take 81 mg by mouth daily with lunch.    clonazePAM (KLONOPIN) 0.5 MG tablet Take one tablet by mouth twice a daily and 3rd tablet as needed for severe anxiety   Menthol, Topical Analgesic, (ICY HOT EX) Apply 1 application topically 3 (three) times daily as needed (pain).    Multiple Vitamin (MULTIVITAMIN WITH MINERALS) TABS tablet Take 1 tablet by mouth daily with lunch.   QUEtiapine (SEROQUEL) 50 MG tablet Take 1 tablet (50 mg total) by mouth at bedtime.   [DISCONTINUED] atorvastatin (LIPITOR) 40 MG tablet Take 1 tablet (40 mg total) by mouth daily at 6 PM.   [DISCONTINUED] Fenofibrate 50 MG CAPS Take 1 capsule (50 mg total) by mouth daily.   [DISCONTINUED] omega-3 acid ethyl esters (LOVAZA) 1 g capsule TAKE 2 CAPSULES BY MOUTH 2 TIMES DAILY.   [DISCONTINUED] omeprazole (PRILOSEC) 20 MG capsule  Take 1 capsule (20 mg total) by mouth daily.   [DISCONTINUED] Vitamin D, Ergocalciferol, (DRISDOL) 1.25 MG (50000 UNIT) CAPS capsule Take 1 capsule (50,000 Units total) by mouth every 7 (seven) days.   No facility-administered medications prior to visit.    ROS Negative unless otherwise noted in HPI   Objective:     BP 132/83 (BP Location: Left Arm, Patient Position: Sitting, Cuff Size: Normal)   Pulse 92   Resp 18   Ht 6' (1.829 m)   Wt 200 lb (90.7 kg)   SpO2 99%   BMI 27.12 kg/m   Physical Exam Constitutional:      General: He is not in acute distress.    Appearance: Normal appearance.  HENT:     Head: Normocephalic and atraumatic.  Cardiovascular:     Rate and Rhythm: Normal rate and regular rhythm.     Heart sounds: Normal heart sounds. No murmur heard.    No friction rub. No gallop.  Pulmonary:     Effort: Pulmonary effort is normal. No respiratory distress.     Breath sounds: Normal breath sounds. No wheezing, rhonchi or rales.  Skin:    General: Skin is warm and dry.  Neurological:     Mental Status: He is alert and oriented to person, place, and time.  Psychiatric:        Mood and Affect: Mood normal.     Assessment & Plan:  Hypertriglyceridemia Assessment &  Plan: Triglycerides improved from 377 to 157 and HDL slightly improved to 31.  Last lipid panel: LDL 83, HDL 31, triglycerides 157.  Encouraged to continue limiting dietary intake of trans and saturated fats and alcohol consumption.  Continue atorvastatin 40 mg daily.  We discussed the option of increasing to atorvastatin 80 mg daily so that he can take the effective dose of Seroquel, patient declines given concerns of atorvastatin and memory loss.  Will continue to monitor.  In the future, may consider switching atorvastatin 40 mg to rosuvastatin 20 mg.  Orders: -     Atorvastatin Calcium; Take 1 tablet (40 mg total) by mouth daily at 6 PM.  Dispense: 90 tablet; Refill: 1  Gastroesophageal reflux  disease, unspecified whether esophagitis present -     Omeprazole; Take 1 capsule (20 mg total) by mouth daily.  Dispense: 90 capsule; Refill: 1    Return in about 6 months (around 09/07/2023) for annual physical, fasting blood work 1 week before.    Melida Quitter, PA

## 2023-03-12 ENCOUNTER — Other Ambulatory Visit (HOSPITAL_COMMUNITY): Payer: Self-pay

## 2023-03-12 ENCOUNTER — Other Ambulatory Visit: Payer: Self-pay

## 2023-03-13 ENCOUNTER — Other Ambulatory Visit: Payer: Self-pay

## 2023-04-24 ENCOUNTER — Other Ambulatory Visit: Payer: Self-pay

## 2023-04-24 ENCOUNTER — Other Ambulatory Visit (HOSPITAL_COMMUNITY): Payer: Self-pay

## 2023-05-19 ENCOUNTER — Encounter (HOSPITAL_COMMUNITY): Payer: Self-pay | Admitting: Psychiatry

## 2023-05-19 ENCOUNTER — Telehealth (HOSPITAL_COMMUNITY): Payer: 59 | Admitting: Psychiatry

## 2023-05-19 ENCOUNTER — Other Ambulatory Visit (HOSPITAL_COMMUNITY): Payer: Self-pay

## 2023-05-19 ENCOUNTER — Other Ambulatory Visit: Payer: Self-pay

## 2023-05-19 VITALS — Wt 200.0 lb

## 2023-05-19 DIAGNOSIS — F319 Bipolar disorder, unspecified: Secondary | ICD-10-CM | POA: Diagnosis not present

## 2023-05-19 DIAGNOSIS — F419 Anxiety disorder, unspecified: Secondary | ICD-10-CM | POA: Diagnosis not present

## 2023-05-19 MED ORDER — QUETIAPINE FUMARATE 50 MG PO TABS
50.0000 mg | ORAL_TABLET | Freq: Every day | ORAL | 0 refills | Status: DC
  Filled 2023-05-19: qty 90, 90d supply, fill #0

## 2023-05-19 MED ORDER — CLONAZEPAM 0.5 MG PO TABS
0.5000 mg | ORAL_TABLET | Freq: Two times a day (BID) | ORAL | 2 refills | Status: DC
  Filled 2023-05-19: qty 75, 25d supply, fill #0
  Filled 2023-06-20: qty 75, 25d supply, fill #1
  Filled 2023-07-29: qty 75, 25d supply, fill #2

## 2023-05-19 NOTE — Progress Notes (Addendum)
[facility] MD Virtual Progress Note   Patient Location: Home Provider Location: Home Office  I connect with patient by video and verified that I am speaking with correct person by using two identifiers. I discussed the limitations of evaluation and management by telemedicine and the availability of in person appointments. I also discussed with the patient that there may be a patient responsible charge related to this service. The patient expressed understanding and agreed to proceed.  Steven Herrera [id number] 52 y.o.  05/19/2023 10:25 AM  History of Present Illness:  Patient was evaluated by video session.  He is sleeping 5 to 6 hours with Seroquel 50 mg.  He recently had blood work and is happy about triglyceride reduced further.  He also watching his calorie intake and eating more salads.  He denies any irritability, anger, mania, psychosis.  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  Denies any suicidal thoughts or homicidal thoughts.  His younger son is living with him and they have 3 grandkids.  His older son live close by.  Patient is happy as kids live close by.  Recently he bring his 73 year old mother to his house because of worsening of her memory.  He takes Klonopin most of the time twice a day but sometimes he take extra when he has to go to crowded places which he does not feel comfortable.  He denies drinking or using any illegal substances.  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  However he is able to manage his work.  He had a good support from his wife.  He wants to continue his current medication.  His energy level is good.  His appetite is okay.  Past Psychiatric History: H/O briefly seeing therapist at [facility] in young age.  H/O mood swing, anger, agitation, hyperactivity and running away.  Use acid, LSD, cocaine, marijuana and heavy drinking.  Tried Zoloft, Lexapro, melatonin and Xanax  did not like it. Took Paxil and Klonopin and Wellbutrin from PCP. We tried Lamictal.    Outpatient Encounter Medications as of 05/19/2023  Medication Sig   aspirin EC 81 MG tablet Take 81 mg by mouth daily with lunch.    atorvastatin (LIPITOR) 40 MG tablet Take 1 tablet (40 mg total) by mouth daily at 6 PM.   clonazePAM (KLONOPIN) 0.5 MG tablet Take one tablet by mouth twice a daily and 3rd tablet as needed for severe anxiety   Menthol, Topical Analgesic, (ICY HOT EX) Apply 1 application topically 3 (three) times daily as needed (pain).    Multiple Vitamin (MULTIVITAMIN WITH MINERALS) TABS tablet Take 1 tablet by mouth daily with lunch.   omeprazole (PRILOSEC) 20 MG capsule Take 1 capsule (20 mg total) by mouth daily.   QUEtiapine (SEROQUEL) 50 MG tablet Take 1 tablet (50 mg total) by mouth at bedtime.   No facility-administered encounter medications on file as of 05/19/2023.    Recent Results (from the past 2160 hour(s))  Lipid panel     Status: Abnormal   Collection Time: 03/03/23 11:38 AM  Result Value Ref Range   Cholesterol, Total 142 100 - 199 mg/dL   Triglycerides 409 (H) 0 - 149 mg/dL   HDL 31 (L) >81 mg/dL   VLDL Cholesterol Cal 28 5 - 40 mg/dL   LDL Chol Calc (NIH) 83 0 - 99 mg/dL   Chol/HDL Ratio 4.6 0.0 - 5.0 ratio  Comment:                                   T. Chol/HDL Ratio                                             Men  Women                               1/2 Avg.Risk  3.4    3.3                                   Avg.Risk  5.0    4.4                                2X Avg.Risk  9.6    7.1                                3X Avg.Risk 23.4   11.0   Comprehensive metabolic panel     Status: None   Collection Time: 03/03/23 11:38 AM  Result Value Ref Range   Glucose 99 70 - 99 mg/dL   BUN 16 6 - 24 mg/dL   Creatinine, Ser 6.21 0.76 - 1.27 mg/dL   eGFR 308 >65 HQ/ION/6.29   BUN/Creatinine Ratio 18 9 - 20   Sodium 140 134 - 144 mmol/L   Potassium 4.4 3.5 - 5.2  mmol/L   Chloride 106 96 - 106 mmol/L   CO2 23 20 - 29 mmol/L   Calcium 9.5 8.7 - 10.2 mg/dL   Total Protein 6.7 6.0 - 8.5 g/dL   Albumin 4.4 4.1 - 5.1 g/dL   Globulin, Total 2.3 1.5 - 4.5 g/dL   Bilirubin Total 0.3 0.0 - 1.2 mg/dL   Alkaline Phosphatase 87 44 - 121 IU/L   AST 23 0 - 40 IU/L   ALT 21 0 - 44 IU/L  CBC with Differential/Platelet     Status: Abnormal   Collection Time: 03/03/23 11:38 AM  Result Value Ref Range   WBC 9.1 3.4 - 10.8 x10E3/uL   RBC 4.79 4.14 - 5.80 x10E6/uL   Hemoglobin 15.9 13.0 - 17.7 g/dL   Hematocrit 52.8 41.3 - 51.0 %   MCV 95 79 - 97 fL   MCH 33.2 (H) 26.6 - 33.0 pg   MCHC 34.9 31.5 - 35.7 g/dL   RDW 24.4 01.0 - 27.2 %   Platelets 232 150 - 450 x10E3/uL   Neutrophils 61 Not Estab. %   Lymphs 24 Not Estab. %   Monocytes 12 Not Estab. %   Eos 2 Not Estab. %   Basos 1 Not Estab. %   Neutrophils Absolute 5.5 1.4 - 7.0 x10E3/uL   Lymphocytes Absolute 2.2 0.7 - 3.1 x10E3/uL   Monocytes Absolute 1.1 (H) 0.1 - 0.9 x10E3/uL   EOS (ABSOLUTE) 0.2 0.0 - 0.4 x10E3/uL   Basophils Absolute 0.1 0.0 - 0.2 x10E3/uL   Immature Granulocytes 0 Not Estab. %   Immature Grans (Abs) 0.0 0.0 - 0.1 x10E3/uL  Psychiatric Specialty Exam: Physical Exam  Review of Systems  Weight 200 lb (90.7 kg).There is no height or weight on file to calculate BMI.  General Appearance: NA  Eye Contact:  NA  Speech:  Clear and Coherent  Volume:  Decreased  Mood:  Euthymic  Affect:  Appropriate  Thought Process:  Goal Directed  Orientation:  Full (Time, Place, and Person)  Thought Content:  WDL  Suicidal Thoughts:  No  Homicidal Thoughts:  No  Memory:  Immediate;   Good Recent;   Good Remote;   Fair  Judgement:  Intact  Insight:  Present  Psychomotor Activity:  Tremor  Concentration:  Concentration: Fair and Attention Span: Fair  Recall:  Good  Fund of Knowledge:  Good  Language:  Good  Akathisia:  No  Handed:  Right  AIMS (if indicated):     Assets:   Communication Skills Desire for Improvement Housing Social Support Talents/Skills Transportation  ADL's:  Intact  Cognition:  WNL  Sleep:  5-6 hrs     Assessment/Plan: Bipolar I disorder (HCC) - Plan: QUEtiapine (SEROQUEL) 50 MG tablet  Anxiety - Plan: clonazePAM (KLONOPIN) 0.5 MG tablet  Patient is stable on his current medication.  He is not interested in an sleep study as sleep is relatively better.  He also focused on his healthy diet.  His last triglyceride is much better.  He used to take 100 mg Seroquel but cut it down to 50 only.  Continue the current dose of Seroquel and Klonopin 0.5 mg to take twice a day as needed and third pill if needed for severe anxiety.  Patient not interested in therapy.  Recommended to call us back if is any question or any concern.  Follow-up in 3 months   Follow Up Instructions:     I discussed the assessment and treatment plan with the patient. The patient was provided an opportunity to ask questions and all were answered. The patient agreed with the plan and demonstrated an understanding of the instructions.   The patient was advised to call back or seek an in-person evaluation if the symptoms worsen or if the condition fails to improve as anticipated.    Collaboration of Care: Other provider involved in patient's care AEB notes are available in epic to review  Patient/Guardian was advised Release of Information must be obtained prior to any record release in order to collaborate their care with an outside provider. Patient/Guardian was advised if they have not already done so to contact the registration department to sign all necessary forms in order for [place] to release information regarding their care.   Consent: Patient/Guardian gives verbal consent for treatment and assignment of benefits for services provided during this visit. Patient/Guardian expressed understanding and agreed to proceed.     I provided 19 minutes of non face to face time  during this encounter.  Note: This document was prepared by [employer] voice dictation technology and any errors that results from this process are unintentional.    Cleotis Nipper, MD 05/19/2023

## 2023-06-20 ENCOUNTER — Other Ambulatory Visit: Payer: Self-pay

## 2023-06-20 ENCOUNTER — Other Ambulatory Visit (HOSPITAL_COMMUNITY): Payer: Self-pay

## 2023-07-29 ENCOUNTER — Other Ambulatory Visit (HOSPITAL_COMMUNITY): Payer: Self-pay

## 2023-07-31 ENCOUNTER — Encounter: Payer: Self-pay | Admitting: Family Medicine

## 2023-08-18 ENCOUNTER — Encounter (HOSPITAL_COMMUNITY): Payer: Self-pay | Admitting: Psychiatry

## 2023-08-18 ENCOUNTER — Other Ambulatory Visit (HOSPITAL_COMMUNITY): Payer: Self-pay

## 2023-08-18 ENCOUNTER — Other Ambulatory Visit: Payer: Self-pay

## 2023-08-18 ENCOUNTER — Telehealth (HOSPITAL_BASED_OUTPATIENT_CLINIC_OR_DEPARTMENT_OTHER): Payer: 59 | Admitting: Psychiatry

## 2023-08-18 VITALS — Wt 200.0 lb

## 2023-08-18 DIAGNOSIS — F419 Anxiety disorder, unspecified: Secondary | ICD-10-CM

## 2023-08-18 DIAGNOSIS — F319 Bipolar disorder, unspecified: Secondary | ICD-10-CM

## 2023-08-18 MED ORDER — QUETIAPINE FUMARATE 25 MG PO TABS
25.0000 mg | ORAL_TABLET | Freq: Every day | ORAL | 0 refills | Status: DC
Start: 2023-08-18 — End: 2023-11-13
  Filled 2023-08-18: qty 90, 90d supply, fill #0

## 2023-08-18 MED ORDER — CLONAZEPAM 0.5 MG PO TABS
0.5000 mg | ORAL_TABLET | Freq: Two times a day (BID) | ORAL | 2 refills | Status: DC
Start: 2023-08-18 — End: 2023-11-13
  Filled 2023-08-18: qty 75, 38d supply, fill #0
  Filled 2023-08-26: qty 75, 25d supply, fill #0
  Filled 2023-09-22: qty 75, 25d supply, fill #1
  Filled 2023-11-10: qty 75, 25d supply, fill #2

## 2023-08-18 NOTE — Progress Notes (Signed)
 [facility] MD Virtual Progress Note   Patient Location: Home Provider Location: Home Office  I connect with patient by video and verified that I am speaking with correct person by using two identifiers. I discussed the limitations of evaluation and management by telemedicine and the availability of in person appointments. I also discussed with the patient that there may be a patient responsible charge related to this service. The patient expressed understanding and agreed to proceed.  Steven Herrera [id number] 53 y.o.  08/18/2023 10:08 AM  History of Present Illness:  Patient is evaluated by video session.  He reported taking Seroquel few times a week at night because it make him very groggy and sleepy.  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  He told past few months were very slow because of the cold weather but now there is a spitting up and people are coming.  Patient told his 67 year old mother living with him who has dementia.  Patient told to grandkids who are 32-year-old and 34-year-old also lives with them.  Denies any major panic attack, mania, anger, irritability or any suicidal thoughts.  His appetite is okay.  His weight is stable.  He still feels very nervous and anxious when too many people around and does not like going to the crowded places.  Denies drinking or using any illegal substances.  He has chronic tremors but most of the time is stable but there are days he does not work when his tremors are too much.  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  He had a good support from his wife.  He does not want to change the medication.  Past Psychiatric History: H/O briefly seeing therapist at [facility] in young age.  H/O mood swing, anger, agitation, hyperactivity and running away.  Use acid, LSD, cocaine, marijuana and heavy drinking.   Tried Zoloft, Lexapro, melatonin and Xanax did not like it. Took Paxil and Klonopin and Wellbutrin from PCP. We tried Lamictal.    Outpatient Encounter Medications as of 08/18/2023  Medication Sig   aspirin EC 81 MG tablet Take 81 mg by mouth daily with lunch.    atorvastatin (LIPITOR) 40 MG tablet Take 1 tablet (40 mg total) by mouth daily at 6 PM.   clonazePAM (KLONOPIN) 0.5 MG tablet Take 1 tablet (0.5 mg total) by mouth 2 (two) times daily. May take a third tablet as needed for severe anxiety   Menthol, Topical Analgesic, (ICY HOT EX) Apply 1 application topically 3 (three) times daily as needed (pain).    Multiple Vitamin (MULTIVITAMIN WITH MINERALS) TABS tablet Take 1 tablet by mouth daily with lunch.   omeprazole (PRILOSEC) 20 MG capsule Take 1 capsule (20 mg total) by mouth daily.   QUEtiapine (SEROQUEL) 50 MG tablet Take 1 tablet (50 mg total) by mouth at bedtime.   No facility-administered encounter medications on file as of 08/18/2023.    No results found for this or any previous visit (from the past 2160 hours).   Psychiatric Specialty Exam: Physical Exam  Review of Systems  Weight 200 lb (90.7 kg).There is no height or weight on file to calculate BMI.  General Appearance: Casual  Eye Contact:  Fair  Speech:  Normal Rate  Volume:  Normal  Mood:  Anxious  Affect:  Appropriate  Thought Process:  Goal Directed  Orientation:  Full (Time, Place, and Person)  Thought Content:  Rumination  Suicidal Thoughts:  No  Homicidal Thoughts:  No  Memory:  Immediate;   Good Recent;   Fair Remote;   Fair  Judgement:  Intact  Insight:  Present  Psychomotor Activity:  Decreased  Concentration:  Concentration: Fair and Attention Span: Fair  Recall:  [employer] of Knowledge:  Good  Language:  Good  Akathisia:  No  Handed:  Right  AIMS (if indicated):     Assets:  Communication Skills Desire for Improvement Housing Talents/Skills Transportation  ADL's:  Intact  Cognition:  WNL   Sleep:  ok     Assessment/Plan: No diagnosis found.  Discussed reducing Seroquel dose and to take every night to help tremors, sleep and his mood.  Patient agreed with the plan.  He used to take Seroquel 100 mg but slowly and gradually dose has cut down.  Currently he is prescribed 50 mg but only taking 2-3 times at night because he could not handle tremors and side effects.  I recommend to try Seroquel 25 mg but take it every day.  He feels his mood symptoms are much better and is stable.  Continue Klonopin 0.5 mg twice a day and third pill as needed.  Recommended to call us back with any question or any concern.  Patient not interested in therapy.  Follow-up in 3 months and encouraged to keep appointment with his primary care for physical and blood work.   Follow Up Instructions:     I discussed the assessment and treatment plan with the patient. The patient was provided an opportunity to ask questions and all were answered. The patient agreed with the plan and demonstrated an understanding of the instructions.   The patient was advised to call back or seek an in-person evaluation if the symptoms worsen or if the condition fails to improve as anticipated.    Collaboration of Care: Other provider involved in patient's care AEB notes are available in epic to review  Patient/Guardian was advised Release of Information must be obtained prior to any record release in order to collaborate their care with an outside provider. Patient/Guardian was advised if they have not already done so to contact the registration department to sign all necessary forms in order for [place] to release information regarding their care.   Consent: Patient/Guardian gives verbal consent for treatment and assignment of benefits for services provided during this visit. Patient/Guardian expressed understanding and agreed to proceed.     I provided 24 minutes of non face to face time during this encounter.  Note: This  document was prepared by [employer] voice dictation technology and any errors that results from this process are unintentional.    Cleotis Nipper, MD 08/18/2023

## 2023-08-19 ENCOUNTER — Other Ambulatory Visit: Payer: Self-pay

## 2023-08-19 DIAGNOSIS — Z720 Tobacco use: Secondary | ICD-10-CM

## 2023-08-19 DIAGNOSIS — E7800 Pure hypercholesterolemia, unspecified: Secondary | ICD-10-CM

## 2023-08-26 ENCOUNTER — Other Ambulatory Visit (HOSPITAL_COMMUNITY): Payer: Self-pay

## 2023-09-01 ENCOUNTER — Other Ambulatory Visit: Payer: 59

## 2023-09-01 DIAGNOSIS — Z720 Tobacco use: Secondary | ICD-10-CM | POA: Diagnosis not present

## 2023-09-01 DIAGNOSIS — E7800 Pure hypercholesterolemia, unspecified: Secondary | ICD-10-CM

## 2023-09-02 LAB — CBC WITH DIFFERENTIAL/PLATELET
Basophils Absolute: 0 10*3/uL (ref 0.0–0.2)
Basos: 1 %
EOS (ABSOLUTE): 0.1 10*3/uL (ref 0.0–0.4)
Eos: 2 %
Hematocrit: 46.9 % (ref 37.5–51.0)
Hemoglobin: 15.7 g/dL (ref 13.0–17.7)
Immature Grans (Abs): 0 10*3/uL (ref 0.0–0.1)
Immature Granulocytes: 0 %
Lymphocytes Absolute: 1.6 10*3/uL (ref 0.7–3.1)
Lymphs: 32 %
MCH: 31.3 pg (ref 26.6–33.0)
MCHC: 33.5 g/dL (ref 31.5–35.7)
MCV: 93 fL (ref 79–97)
Monocytes Absolute: 0.6 10*3/uL (ref 0.1–0.9)
Monocytes: 12 %
Neutrophils Absolute: 2.7 10*3/uL (ref 1.4–7.0)
Neutrophils: 53 %
Platelets: 271 10*3/uL (ref 150–450)
RBC: 5.02 x10E6/uL (ref 4.14–5.80)
RDW: 12.2 % (ref 11.6–15.4)
WBC: 5.1 10*3/uL (ref 3.4–10.8)

## 2023-09-02 LAB — LIPID PANEL
Chol/HDL Ratio: 4.3 ratio (ref 0.0–5.0)
Cholesterol, Total: 168 mg/dL (ref 100–199)
HDL: 39 mg/dL — ABNORMAL LOW (ref >39)
LDL Chol Calc (NIH): 106 mg/dL — ABNORMAL HIGH (ref 0–99)
Triglycerides: 126 mg/dL (ref 0–149)
VLDL Cholesterol Cal: 23 mg/dL (ref 5–40)

## 2023-09-08 ENCOUNTER — Encounter: Payer: 59 | Admitting: Family Medicine

## 2023-09-14 NOTE — Progress Notes (Unsigned)
   Annual physical  Subjective   Patient ID: ADEM COSTLOW, male    DOB: 1970-10-30  Age: 53 y.o. MRN: [id number]  No chief complaint on file.  HPI Kyndal is a 53 y.o. old male here  for annual exam.   Work:*** Relationship:*** Children:*** Tobacco:*** Alcohol:*** Recreational drugs:***  Diet:*** Exercise:***  Family history of prostate or colorectal cancer:***  Advance directive:***  Other providers:***  HPI  Separate, acute concerns today: ***  Atorvastatin 40 mg   Depression anxiety-Klonopin and Seroquel.  CVA-Lipitor 40 mg and aspirin 81.  Goal should be less than 70   The ASCVD Risk score (Arnett DK, et al., 2019) failed to calculate for the following reasons:   Risk score cannot be calculated because patient has a medical history suggesting prior/existing ASCVD  Health Maintenance Due  Topic Date Due   COVID-19 Vaccine (1) Never done   Pneumococcal Vaccine 46-31 Years old (1 of 2 - PCV) Never done   Zoster Vaccines- Shingrix (1 of 2) Never done      Objective:     There were no vitals taken for this visit. {Vitals History (Optional):23777}  Physical Exam   No results found for any visits on 09/15/23.      Assessment & Plan:   There are no diagnoses linked to this encounter.   No follow-ups on file.    Sandre Kitty, MD

## 2023-09-15 ENCOUNTER — Ambulatory Visit (INDEPENDENT_AMBULATORY_CARE_PROVIDER_SITE_OTHER): Payer: 59 | Admitting: Family Medicine

## 2023-09-15 ENCOUNTER — Encounter: Payer: Self-pay | Admitting: Family Medicine

## 2023-09-15 VITALS — BP 134/77 | HR 64 | Ht 72.0 in | Wt 203.0 lb

## 2023-09-15 DIAGNOSIS — K219 Gastro-esophageal reflux disease without esophagitis: Secondary | ICD-10-CM | POA: Diagnosis not present

## 2023-09-15 DIAGNOSIS — E781 Pure hyperglyceridemia: Secondary | ICD-10-CM

## 2023-09-15 DIAGNOSIS — Z0000 Encounter for general adult medical examination without abnormal findings: Secondary | ICD-10-CM

## 2023-09-15 DIAGNOSIS — E7800 Pure hypercholesterolemia, unspecified: Secondary | ICD-10-CM | POA: Diagnosis not present

## 2023-09-15 NOTE — Assessment & Plan Note (Signed)
 Patient only taking a statin 3 times a week currently.  Plans to stop completely and do things "holistically".  Was originally going to discuss with him increasing his atorvastatin dose but he is not receptive to this.  He is very certain he wants to stop taking this medication out of concerns for developing dementia, which both of his parents had.

## 2023-09-15 NOTE — Patient Instructions (Signed)
 It was nice to see you today,  We addressed the following topics today: - We will see back in 6 months to recheck your cholesterol   Have a great day,  Frederic Jericho, MD

## 2023-09-15 NOTE — Assessment & Plan Note (Signed)
 Has had multiple readings in the past over 1000 for triglycerides.  Attributes it to whiskey which he has quit.  He is tapering himself off of statin because he no longer wants to be on them.  He has concern about dementia in regards to this.  Will recheck again in 6 months.Marland Kitchen

## 2023-09-15 NOTE — Assessment & Plan Note (Signed)
 Takes omeprazole daily.  If he does not take it gets significant reflux.

## 2023-09-22 ENCOUNTER — Other Ambulatory Visit: Payer: Self-pay | Admitting: Family Medicine

## 2023-09-22 ENCOUNTER — Other Ambulatory Visit (HOSPITAL_COMMUNITY): Payer: Self-pay

## 2023-09-22 DIAGNOSIS — K219 Gastro-esophageal reflux disease without esophagitis: Secondary | ICD-10-CM

## 2023-09-23 ENCOUNTER — Other Ambulatory Visit: Payer: Self-pay

## 2023-09-24 ENCOUNTER — Other Ambulatory Visit (HOSPITAL_COMMUNITY): Payer: Self-pay

## 2023-09-24 ENCOUNTER — Other Ambulatory Visit: Payer: Self-pay

## 2023-09-24 MED ORDER — OMEPRAZOLE 20 MG PO CPDR
20.0000 mg | DELAYED_RELEASE_CAPSULE | Freq: Every day | ORAL | 1 refills | Status: DC
  Filled 2023-09-24: qty 90, 90d supply, fill #0
  Filled 2023-12-16: qty 90, 90d supply, fill #1

## 2023-11-10 ENCOUNTER — Other Ambulatory Visit: Payer: Self-pay

## 2023-11-10 ENCOUNTER — Other Ambulatory Visit (HOSPITAL_COMMUNITY): Payer: Self-pay

## 2023-11-13 ENCOUNTER — Other Ambulatory Visit: Payer: Self-pay

## 2023-11-13 ENCOUNTER — Encounter (HOSPITAL_COMMUNITY): Payer: Self-pay | Admitting: Psychiatry

## 2023-11-13 ENCOUNTER — Other Ambulatory Visit (HOSPITAL_COMMUNITY): Payer: Self-pay

## 2023-11-13 ENCOUNTER — Telehealth (HOSPITAL_BASED_OUTPATIENT_CLINIC_OR_DEPARTMENT_OTHER): Payer: 59 | Admitting: Psychiatry

## 2023-11-13 DIAGNOSIS — F419 Anxiety disorder, unspecified: Secondary | ICD-10-CM | POA: Diagnosis not present

## 2023-11-13 DIAGNOSIS — F319 Bipolar disorder, unspecified: Secondary | ICD-10-CM | POA: Diagnosis not present

## 2023-11-13 MED ORDER — QUETIAPINE FUMARATE 25 MG PO TABS
25.0000 mg | ORAL_TABLET | Freq: Every day | ORAL | 0 refills | Status: DC
  Filled 2023-11-13: qty 90, 90d supply, fill #0

## 2023-11-13 MED ORDER — CLONAZEPAM 0.5 MG PO TABS
0.5000 mg | ORAL_TABLET | Freq: Two times a day (BID) | ORAL | 2 refills | Status: DC
  Filled 2023-11-13: qty 75, 38d supply, fill #0
  Filled 2023-12-16: qty 75, 25d supply, fill #0
  Filled 2024-01-19: qty 75, 25d supply, fill #1

## 2023-11-13 NOTE — Progress Notes (Signed)
 [facility] MD Virtual Progress Note   Patient Location: Home Provider Location: Home  I connect with patient by video and verified that I am speaking with correct person by using two identifiers. I discussed the limitations of evaluation and management by telemedicine and the availability of in person appointments. I also discussed with the patient that there may be a patient responsible charge related to this service. The patient expressed understanding and agreed to proceed.  Steven Herrera [id number] 53 y.o.  11/13/2023 10:08 AM  History of Present Illness:  Patient is evaluated by video session.  He reported things are going well.  He has mild tremors in his hand but able to function.  Sometime he does not work those days when he feels tremors are bad.  He is taking Seroquel  now 25 mg every night and that is helping his sleep.  He also reported his anxiety is not as bad.  He does not feel comfortable around too many people and does not like to go to crowded places but denies any agitation, anger, suicidal thoughts or homicidal thoughts.  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  He does not want to change the medication.  Recently had a blood work and his cholesterol is slightly high.  He does not want to take statin and like to take the home remedy.  He is now taking tumeric, honey together.  He denies any mania, psychosis, hallucination.  He used to take Seroquel  100 mg but slowly and gradually cut down and only taking 25 mg but consistent with Klonopin  0.5 mg twice a day and rarely he needs third pill.  He denies drinking or using any illegal substances.  His appetite is okay.  Past Psychiatric History: H/O briefly seeing therapist at [facility] in young age.  H/O mood swing, anger, agitation, hyperactivity and running away.  Use acid, LSD, cocaine, marijuana and heavy drinking.  Tried Zoloft, Lexapro, melatonin and Xanax did not like it. Took  Paxil  and Klonopin  and Wellbutrin  from PCP. We tried Lamictal .    Outpatient Encounter Medications as of 11/13/2023  Medication Sig   aspirin  EC 81 MG tablet Take 81 mg by mouth daily with lunch.    atorvastatin  (LIPITOR ) 40 MG tablet Take 1 tablet (40 mg total) by mouth daily at 6 PM.   clonazePAM  (KLONOPIN ) 0.5 MG tablet Take 1 tablet (0.5 mg total) by mouth 2 (two) times daily. May take a third tablet as needed for severe anxiety   Menthol, Topical Analgesic, (ICY HOT EX) Apply 1 application topically 3 (three) times daily as needed (pain).    Multiple Vitamin (MULTIVITAMIN WITH MINERALS) TABS tablet Take 1 tablet by mouth daily with lunch.   omeprazole  (PRILOSEC) 20 MG capsule Take 1 capsule (20 mg total) by mouth daily.   QUEtiapine  (SEROQUEL ) 25 MG tablet Take 1 tablet (25 mg total) by mouth at bedtime.   No facility-administered encounter medications on file as of 11/13/2023.    Recent Results (from the past 2160 hours)  Lipid panel     Status: Abnormal   Collection Time: 09/01/23  9:15 AM  Result Value Ref Range   Cholesterol, Total 168 100 - 199 mg/dL   Triglycerides 409 0 - 149 mg/dL   HDL 39 (L) >81 mg/dL   VLDL Cholesterol Cal 23 5 - 40 mg/dL   LDL Chol Calc (NIH) 191 (H) 0 - 99 mg/dL   Chol/HDL Ratio 4.3 0.0 - 5.0  ratio    Comment:                                   T. Chol/HDL Ratio                                             Men  Women                               1/2 Avg.Risk  3.4    3.3                                   Avg.Risk  5.0    4.4                                2X Avg.Risk  9.6    7.1                                3X Avg.Risk 23.4   11.0   CBC with Differential/Platelet     Status: None   Collection Time: 09/01/23  9:15 AM  Result Value Ref Range   WBC 5.1 3.4 - 10.8 x10E3/uL   RBC 5.02 4.14 - 5.80 x10E6/uL   Hemoglobin 15.7 13.0 - 17.7 g/dL   Hematocrit 56.2 13.0 - 51.0 %   MCV 93 79 - 97 fL   MCH 31.3 26.6 - 33.0 pg   MCHC 33.5 31.5 - 35.7 g/dL    RDW 86.5 78.4 - 69.6 %   Platelets 271 150 - 450 x10E3/uL   Neutrophils 53 Not Estab. %   Lymphs 32 Not Estab. %   Monocytes 12 Not Estab. %   Eos 2 Not Estab. %   Basos 1 Not Estab. %   Neutrophils Absolute 2.7 1.4 - 7.0 x10E3/uL   Lymphocytes Absolute 1.6 0.7 - 3.1 x10E3/uL   Monocytes Absolute 0.6 0.1 - 0.9 x10E3/uL   EOS (ABSOLUTE) 0.1 0.0 - 0.4 x10E3/uL   Basophils Absolute 0.0 0.0 - 0.2 x10E3/uL   Immature Granulocytes 0 Not Estab. %   Immature Grans (Abs) 0.0 0.0 - 0.1 x10E3/uL     Psychiatric Specialty Exam: Physical Exam  Review of Systems  There were no vitals taken for this visit.There is no height or weight on file to calculate BMI.  General Appearance: Casual  Eye Contact:  Fair  Speech:  Normal Rate  Volume:  Normal  Mood:  Euthymic  Affect:  Congruent  Thought Process:  Goal Directed  Orientation:  Full (Time, Place, and Person)  Thought Content:  Logical  Suicidal Thoughts:  No  Homicidal Thoughts:  No  Memory:  Immediate;   Good Recent;   Fair Remote;   Fair  Judgement:  Intact  Insight:  Present  Psychomotor Activity:  Tremor  Concentration:  Concentration: Fair and Attention Span: Fair  Recall:  [employer] of Knowledge:  Good  Language:  Good  Akathisia:  No  Handed:  Right  AIMS (if indicated):     Assets:  Communication Skills Desire for Improvement  Housing Social Support Talents/Skills  ADL's:  Intact  Cognition:  WNL  Sleep:  ok       09/15/2023    1:43 PM 03/10/2023   10:33 AM 10/28/2022   10:26 AM 03/11/2022   10:50 AM 09/03/2021   10:41 AM  Depression screen PHQ 2/9  Decreased Interest 0 1 2 1 1   Down, Depressed, Hopeless 0 2 1 1 1   PHQ - 2 Score 0 3 3 2 2   Altered sleeping 0 3 2 3 3   Tired, decreased energy 0 2 1 1 1   Change in appetite 0 0 1 0 0  Feeling bad or failure about yourself  0 0 0 0 3  Trouble concentrating 2 3 3 1 1   Moving slowly or fidgety/restless 2 1 1  0 0  Suicidal thoughts 0 0 0 0 0  PHQ-9 Score 4 12 11 7  10   Difficult doing work/chores Somewhat difficult Somewhat difficult Not difficult at all Not difficult at all Somewhat difficult    Assessment/Plan: Anxiety - Plan: clonazePAM  (KLONOPIN ) 0.5 MG tablet  Bipolar I disorder (HCC) - Plan: QUEtiapine  (SEROQUEL ) 25 MG tablet  Patient is stable on his current medication.  Reviewed blood work results.  He is using home remedy to help his cholesterol because does not want to take statin.  His 23 year old mother lives with patient and he reported she has mild dementia.  Overall mood stable.  Continue Seroquel  25 mg at bedtime and Klonopin  0.5 mg twice a day and third pill as needed.  Recommended to call us  back with any question or any concern.  Follow-up in 3 months.   Follow Up Instructions:     I discussed the assessment and treatment plan with the patient. The patient was provided an opportunity to ask questions and all were answered. The patient agreed with the plan and demonstrated an understanding of the instructions.   The patient was advised to call back or seek an in-person evaluation if the symptoms worsen or if the condition fails to improve as anticipated.    Collaboration of Care: Other provider involved in patient's care AEB notes are available in epic to review  Patient/Guardian was advised Release of Information must be obtained prior to any record release in order to collaborate their care with an outside provider. Patient/Guardian was advised if they have not already done so to contact the registration department to sign all necessary forms in order for us  to release information regarding their care.   Consent: Patient/Guardian gives verbal consent for treatment and assignment of benefits for services provided during this visit. Patient/Guardian expressed understanding and agreed to proceed.     Total encounter time 18 minutes which includes face-to-face time, chart reviewed, care coordination, order entry and documentation  during this encounter.   Note: This document was prepared by [employer] voice dictation technology and any errors that results from this process are unintentional.    Arturo Late, MD 11/13/2023

## 2023-12-16 ENCOUNTER — Other Ambulatory Visit (HOSPITAL_COMMUNITY): Payer: Self-pay

## 2023-12-16 ENCOUNTER — Other Ambulatory Visit: Payer: Self-pay

## 2024-01-19 ENCOUNTER — Other Ambulatory Visit (HOSPITAL_COMMUNITY): Payer: Self-pay

## 2024-01-19 ENCOUNTER — Other Ambulatory Visit: Payer: Self-pay

## 2024-02-13 ENCOUNTER — Encounter (HOSPITAL_COMMUNITY): Payer: Self-pay | Admitting: Psychiatry

## 2024-02-13 ENCOUNTER — Telehealth (HOSPITAL_COMMUNITY): Admitting: Psychiatry

## 2024-02-13 ENCOUNTER — Other Ambulatory Visit (HOSPITAL_COMMUNITY): Payer: Self-pay

## 2024-02-13 VITALS — Wt 203.0 lb

## 2024-02-13 DIAGNOSIS — F319 Bipolar disorder, unspecified: Secondary | ICD-10-CM

## 2024-02-13 DIAGNOSIS — F419 Anxiety disorder, unspecified: Secondary | ICD-10-CM | POA: Diagnosis not present

## 2024-02-13 DIAGNOSIS — R251 Tremor, unspecified: Secondary | ICD-10-CM

## 2024-02-13 MED ORDER — CLONAZEPAM 0.5 MG PO TABS
0.5000 mg | ORAL_TABLET | Freq: Two times a day (BID) | ORAL | 2 refills | Status: DC
  Filled 2024-02-13: qty 75, 25d supply, fill #0
  Filled 2024-03-29: qty 75, 25d supply, fill #1
  Filled 2024-04-28: qty 75, 25d supply, fill #2

## 2024-02-13 MED ORDER — QUETIAPINE FUMARATE 25 MG PO TABS
25.0000 mg | ORAL_TABLET | Freq: Every day | ORAL | 0 refills | Status: DC
  Filled 2024-02-13: qty 90, 90d supply, fill #0

## 2024-02-13 NOTE — Progress Notes (Signed)
 [facility] MD Virtual Progress Note   Patient Location: Home Provider Location: Home Office  I connect with patient by video and verified that I am speaking with correct person by using two identifiers. I discussed the limitations of evaluation and management by telemedicine and the availability of in person appointments. I also discussed with the patient that there may be a patient responsible charge related to this service. The patient expressed understanding and agreed to proceed.  Steven Herrera [id number] 53 y.o.  02/13/2024 9:53 AM  History of Present Illness:  Patient is evaluated by video session.  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  Now he pay but slowly and gradually he is getting customers.  He also frustrated with increased water pill and he has to look for the leaks but so far had not found anyone.  He is hoping things get better.  He denies any mania psychosis but these days sometime he takes third Klonopin  to calm himself.  He reported his mother continued to struggle with decline in memory.  He is concerned about her but try to be supportive as much as possible.  He does not leave the house unless it is important.  He works 7 days a week.  He admitted not comfortable around too many people and does not like crowds but so far he feels symptoms are manageable with the medication.  Some nights he struggled with sleep and take quetiapine  on those nights.  He does not take quetiapine  every night because it make him groggy next day.  He has mild tremors but things are manageable.  He has an appointment to see his PCP coming up in September.  He is hoping taking tumeric, honey and watching his calorie may help lower his cholesterol.  He denies drinking or using any illegal substances.  His appetite is okay.  Weight stable  Past Psychiatric History: H/O briefly seeing therapist at [facility] in young age.   H/O mood swing, anger, agitation, hyperactivity and running away.  Use acid, LSD, cocaine, marijuana and heavy drinking.  Tried Zoloft, Lexapro, melatonin and Xanax did not like it. Took Paxil  and Klonopin  and Wellbutrin  from PCP. We tried Lamictal .   Past Medical History:  Diagnosis Date   Depression    High cholesterol    PFO (patent foramen ovale)    a. s/p PFO closure by Dr. Wonda on 11/19/17   Smoker    Stroke [facility]) 2019    Outpatient Encounter Medications as of 02/13/2024  Medication Sig   aspirin  EC 81 MG tablet Take 81 mg by mouth daily with lunch.    atorvastatin  (LIPITOR ) 40 MG tablet Take 1 tablet (40 mg total) by mouth daily at 6 PM.   clonazePAM  (KLONOPIN ) 0.5 MG tablet Take 1 tablet (0.5 mg total) by mouth 2 (two) times daily. May take a third tablet as needed for severe anxiety   Menthol, Topical Analgesic, (ICY HOT EX) Apply 1 application topically 3 (three) times daily as needed (pain).    Multiple Vitamin (MULTIVITAMIN WITH MINERALS) TABS tablet Take 1 tablet by mouth daily with lunch.   omeprazole  (PRILOSEC) 20 MG capsule Take 1 capsule (20 mg total) by mouth daily.   QUEtiapine  (SEROQUEL ) 25 MG tablet Take 1 tablet (25 mg total) by mouth at bedtime.   No facility-administered encounter medications on file as of 02/13/2024.    No results found for this or any previous  visit (from the past 2160 hours).   Psychiatric Specialty Exam: Physical Exam  Review of Systems  Weight 203 lb (92.1 kg).There is no height or weight on file to calculate BMI.  General Appearance: NA  Eye Contact:  NA  Speech:  Normal Rate  Volume:  Normal  Mood:  Euthymic  Affect:  Congruent  Thought Process:  Descriptions of Associations: Intact  Orientation:  Full (Time, Place, and Person)  Thought Content:  Rumination  Suicidal Thoughts:  No  Homicidal Thoughts:  No  Memory:  Immediate;   Good Recent;   Good Remote;   Good  Judgement:  Intact  Insight:  Present  Psychomotor Activity:   NA  Concentration:  Concentration: Good and Attention Span: Good  Recall:  Good  Fund of Knowledge:  Good  Language:  Good  Akathisia:  No  Handed:  Right  AIMS (if indicated):     Assets:  Communication Skills Desire for Improvement Housing Talents/Skills Transportation  ADL's:  Intact  Cognition:  WNL  Sleep:  fair       09/15/2023    1:43 PM 03/10/2023   10:33 AM 10/28/2022   10:26 AM 03/11/2022   10:50 AM 09/03/2021   10:41 AM  Depression screen PHQ 2/9  Decreased Interest 0 1 2 1 1   Down, Depressed, Hopeless 0 2 1 1 1   PHQ - 2 Score 0 3 3 2 2   Altered sleeping 0 3 2 3 3   Tired, decreased energy 0 2 1 1 1   Change in appetite 0 0 1 0 0  Feeling bad or failure about yourself  0 0 0 0 3  Trouble concentrating 2 3 3 1 1   Moving slowly or fidgety/restless 2 1 1  0 0  Suicidal thoughts 0 0 0 0 0  PHQ-9 Score 4 12 11 7 10   Difficult doing work/chores Somewhat difficult Somewhat difficult Not difficult at all Not difficult at all Somewhat difficult    Assessment/Plan: Bipolar I disorder (HCC) - Plan: clonazePAM  (KLONOPIN ) 0.5 MG tablet, QUEtiapine  (SEROQUEL ) 25 MG tablet  Anxiety - Plan: clonazePAM  (KLONOPIN ) 0.5 MG tablet  Tremor - Plan: clonazePAM  (KLONOPIN ) 0.5 MG tablet  Discussed psychosocial stressors contributing to his anxiety.  Recommend to take extra Seroquel  if needed.  He used to take 100 mg but slowly and gradually cut down and only taking 25 mg when he cannot sleep at night.  Patient admitted that if needed he will take extra Seroquel  but so far he feels medicine is working and symptoms are manageable.  Continue Klonopin  0.5 mg twice a day and third pill if needed.  Discussed benzodiazepine dependence tolerance and withdrawal.  Patient is not interested in therapy.  Recommend to call back if is any question or any concern.  Follow-up in 3 months.   Follow Up Instructions:     I discussed the assessment and treatment plan with the patient. The patient was  provided an opportunity to ask questions and all were answered. The patient agreed with the plan and demonstrated an understanding of the instructions.   The patient was advised to call back or seek an in-person evaluation if the symptoms worsen or if the condition fails to improve as anticipated.    Collaboration of Care: Other provider involved in patient's care AEB notes are available in epic to review  Patient/Guardian was advised Release of Information must be obtained prior to any record release in order to collaborate their care with an outside provider. Patient/Guardian was  advised if they have not already done so to contact the registration department to sign all necessary forms in order for us  to release information regarding their care.   Consent: Patient/Guardian gives verbal consent for treatment and assignment of benefits for services provided during this visit. Patient/Guardian expressed understanding and agreed to proceed.     Total encounter time 20 minutes which includes face-to-face time, chart reviewed, care coordination, order entry and documentation during this encounter.   Note: This document was prepared by [employer] voice dictation technology and any errors that results from this process are unintentional.    Leni ONEIDA Client, MD 02/13/2024

## 2024-03-11 ENCOUNTER — Other Ambulatory Visit (HOSPITAL_BASED_OUTPATIENT_CLINIC_OR_DEPARTMENT_OTHER): Payer: Self-pay | Admitting: *Deleted

## 2024-03-11 DIAGNOSIS — E559 Vitamin D deficiency, unspecified: Secondary | ICD-10-CM

## 2024-03-11 DIAGNOSIS — E781 Pure hyperglyceridemia: Secondary | ICD-10-CM

## 2024-03-11 DIAGNOSIS — F17209 Nicotine dependence, unspecified, with unspecified nicotine-induced disorders: Secondary | ICD-10-CM

## 2024-03-15 ENCOUNTER — Other Ambulatory Visit

## 2024-03-15 ENCOUNTER — Other Ambulatory Visit (HOSPITAL_COMMUNITY): Payer: Self-pay

## 2024-03-15 ENCOUNTER — Other Ambulatory Visit: Payer: Self-pay | Admitting: Family Medicine

## 2024-03-15 ENCOUNTER — Other Ambulatory Visit: Payer: Self-pay

## 2024-03-15 DIAGNOSIS — E559 Vitamin D deficiency, unspecified: Secondary | ICD-10-CM

## 2024-03-15 DIAGNOSIS — E781 Pure hyperglyceridemia: Secondary | ICD-10-CM | POA: Diagnosis not present

## 2024-03-15 DIAGNOSIS — K219 Gastro-esophageal reflux disease without esophagitis: Secondary | ICD-10-CM

## 2024-03-15 DIAGNOSIS — F17209 Nicotine dependence, unspecified, with unspecified nicotine-induced disorders: Secondary | ICD-10-CM | POA: Diagnosis not present

## 2024-03-15 MED ORDER — OMEPRAZOLE 20 MG PO CPDR
20.0000 mg | DELAYED_RELEASE_CAPSULE | Freq: Every day | ORAL | 1 refills | Status: AC
  Filled 2024-03-15: qty 90, 90d supply, fill #0
  Filled 2024-06-02: qty 90, 90d supply, fill #1

## 2024-03-16 LAB — COMPREHENSIVE METABOLIC PANEL WITH GFR
ALT: 20 IU/L (ref 0–44)
AST: 26 IU/L (ref 0–40)
Albumin: 4.3 g/dL (ref 3.8–4.9)
Alkaline Phosphatase: 73 IU/L (ref 47–123)
BUN/Creatinine Ratio: 10 (ref 9–20)
BUN: 9 mg/dL (ref 6–24)
Bilirubin Total: 0.4 mg/dL (ref 0.0–1.2)
CO2: 22 mmol/L (ref 20–29)
Calcium: 9.3 mg/dL (ref 8.7–10.2)
Chloride: 101 mmol/L (ref 96–106)
Creatinine, Ser: 0.89 mg/dL (ref 0.76–1.27)
Globulin, Total: 2.7 g/dL (ref 1.5–4.5)
Glucose: 96 mg/dL (ref 70–99)
Potassium: 4.6 mmol/L (ref 3.5–5.2)
Sodium: 137 mmol/L (ref 134–144)
Total Protein: 7 g/dL (ref 6.0–8.5)
eGFR: 104 mL/min/1.73 (ref >59)

## 2024-03-16 LAB — CBC WITH DIFFERENTIAL/PLATELET
Basophils Absolute: 0.1 x10E3/uL (ref 0.0–0.2)
Basos: 1 %
EOS (ABSOLUTE): 0.1 x10E3/uL (ref 0.0–0.4)
Eos: 2 %
Hematocrit: 44.6 % (ref 37.5–51.0)
Hemoglobin: 14.9 g/dL (ref 13.0–17.7)
Immature Grans (Abs): 0 x10E3/uL (ref 0.0–0.1)
Immature Granulocytes: 0 %
Lymphocytes Absolute: 2.1 x10E3/uL (ref 0.7–3.1)
Lymphs: 38 %
MCH: 31.1 pg (ref 26.6–33.0)
MCHC: 33.4 g/dL (ref 31.5–35.7)
MCV: 93 fL (ref 79–97)
Monocytes Absolute: 0.6 x10E3/uL (ref 0.1–0.9)
Monocytes: 11 %
Neutrophils Absolute: 2.7 x10E3/uL (ref 1.4–7.0)
Neutrophils: 48 %
Platelets: 254 x10E3/uL (ref 150–450)
RBC: 4.79 x10E6/uL (ref 4.14–5.80)
RDW: 12.8 % (ref 11.6–15.4)
WBC: 5.7 x10E3/uL (ref 3.4–10.8)

## 2024-03-16 LAB — LIPID PANEL
Chol/HDL Ratio: 5.2 ratio — ABNORMAL HIGH (ref 0.0–5.0)
Cholesterol, Total: 197 mg/dL (ref 100–199)
HDL: 38 mg/dL — ABNORMAL LOW (ref >39)
LDL Chol Calc (NIH): 134 mg/dL — ABNORMAL HIGH (ref 0–99)
Triglycerides: 137 mg/dL (ref 0–149)
VLDL Cholesterol Cal: 25 mg/dL (ref 5–40)

## 2024-03-16 LAB — HEMOGLOBIN A1C
Est. average glucose Bld gHb Est-mCnc: 103 mg/dL
Hgb A1c MFr Bld: 5.2 % (ref 4.8–5.6)

## 2024-03-16 LAB — VITAMIN D 25 HYDROXY (VIT D DEFICIENCY, FRACTURES): Vit D, 25-Hydroxy: 27.2 ng/mL — ABNORMAL LOW (ref 30.0–100.0)

## 2024-03-22 ENCOUNTER — Ambulatory Visit: Admitting: Family Medicine

## 2024-03-22 ENCOUNTER — Encounter: Payer: Self-pay | Admitting: Family Medicine

## 2024-03-22 VITALS — BP 132/80 | HR 64 | Ht 72.0 in | Wt 207.8 lb

## 2024-03-22 DIAGNOSIS — E781 Pure hyperglyceridemia: Secondary | ICD-10-CM

## 2024-03-22 DIAGNOSIS — F418 Other specified anxiety disorders: Secondary | ICD-10-CM | POA: Diagnosis not present

## 2024-03-22 DIAGNOSIS — E559 Vitamin D deficiency, unspecified: Secondary | ICD-10-CM | POA: Diagnosis not present

## 2024-03-22 DIAGNOSIS — E7800 Pure hypercholesterolemia, unspecified: Secondary | ICD-10-CM

## 2024-03-22 DIAGNOSIS — I639 Cerebral infarction, unspecified: Secondary | ICD-10-CM

## 2024-03-22 DIAGNOSIS — R030 Elevated blood-pressure reading, without diagnosis of hypertension: Secondary | ICD-10-CM | POA: Diagnosis not present
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
## 2024-03-22 NOTE — Assessment & Plan Note (Signed)
 Currently uses Seroquel  25mg  qhs PRN with good effect but notes next-day grogginess. - Continue current Seroquel  25mg  PRN regimen. - continue klonopin  prn - medications managed per psych

## 2024-03-22 NOTE — Patient Instructions (Signed)
 It was nice to see you today,  We addressed the following topics today: -Your cholesterol was elevated.  Some things you can do that are over-the-counter that may help a little bit include: Red yeast rice supplements, co-Q10 supplements, plant sterols supplements or plant sterols based butters, Metamucil fiber. - Zetia is a medication that works differently than statins.  It blocks absorption of dietary cholesterol and should not have the same side effects as statins.  It does not work as well as statins though.   Have a great day,  Rolan Slain, MD

## 2024-03-22 NOTE — Assessment & Plan Note (Signed)
-   Labs show minimally low Vitamin D  level. - Recommended starting over-the-counter Vitamin D3 2000 units daily.

## 2024-03-22 NOTE — Assessment & Plan Note (Signed)
 Currently not on statin.  - Follow-up on lab results. Cholesterol is elevated after discontinuing statin therapy. Diet has recently been high in saturated fats, which may be a contributing factor. Discussed non-statin options for management. Patient prefers to attempt lifestyle modification and supplements first. - Recheck lipid panel in 3 months. - Counseled on dietary changes, including increasing fiber (e.g., Metamucil). - Counseled on over-the-counter supplements including CoQ10, red yeast rice, and plant sterols. - Discussed non-statin prescription option, Zetia (ezetimibe), but will hold off at this time per patient preference.

## 2024-03-22 NOTE — Assessment & Plan Note (Signed)
-   Elevated blood pressure noted in office. Patient attributes this to situational stress. Has a manual BP cuff at home. - Instructed to monitor BP at home for a couple of weeks if readings remain elevated. - Bring BP log to the next appointment. Will defer any medication changes pending home BP readings.

## 2024-03-29 ENCOUNTER — Other Ambulatory Visit: Payer: Self-pay

## 2024-03-29 ENCOUNTER — Other Ambulatory Visit (HOSPITAL_COMMUNITY): Payer: Self-pay

## 2024-04-28 ENCOUNTER — Other Ambulatory Visit: Payer: Self-pay

## 2024-04-28 ENCOUNTER — Other Ambulatory Visit (HOSPITAL_COMMUNITY): Payer: Self-pay

## 2024-05-03 ENCOUNTER — Other Ambulatory Visit (HOSPITAL_COMMUNITY): Payer: Self-pay

## 2024-06-02 ENCOUNTER — Other Ambulatory Visit (HOSPITAL_COMMUNITY): Payer: Self-pay

## 2024-06-02 ENCOUNTER — Other Ambulatory Visit (HOSPITAL_COMMUNITY): Payer: Self-pay | Admitting: Psychiatry

## 2024-06-02 ENCOUNTER — Other Ambulatory Visit: Payer: Self-pay

## 2024-06-02 DIAGNOSIS — F419 Anxiety disorder, unspecified: Secondary | ICD-10-CM

## 2024-06-02 DIAGNOSIS — R251 Tremor, unspecified: Secondary | ICD-10-CM

## 2024-06-02 DIAGNOSIS — F319 Bipolar disorder, unspecified: Secondary | ICD-10-CM

## 2024-06-21 ENCOUNTER — Other Ambulatory Visit

## 2024-06-21 DIAGNOSIS — E559 Vitamin D deficiency, unspecified: Secondary | ICD-10-CM

## 2024-06-21 DIAGNOSIS — I639 Cerebral infarction, unspecified: Secondary | ICD-10-CM

## 2024-06-21 DIAGNOSIS — E7800 Pure hypercholesterolemia, unspecified: Secondary | ICD-10-CM

## 2024-06-21 DIAGNOSIS — E781 Pure hyperglyceridemia: Secondary | ICD-10-CM

## 2024-06-22 ENCOUNTER — Ambulatory Visit: Payer: Self-pay | Admitting: Family Medicine

## 2024-06-22 LAB — CBC WITH DIFFERENTIAL/PLATELET
Basophils Absolute: 0.1 x10E3/uL (ref 0.0–0.2)
Basos: 1 %
EOS (ABSOLUTE): 0.2 x10E3/uL (ref 0.0–0.4)
Eos: 3 %
Hematocrit: 45.7 % (ref 37.5–51.0)
Hemoglobin: 15.4 g/dL (ref 13.0–17.7)
Immature Grans (Abs): 0 x10E3/uL (ref 0.0–0.1)
Immature Granulocytes: 0 %
Lymphocytes Absolute: 1.9 x10E3/uL (ref 0.7–3.1)
Lymphs: 36 %
MCH: 31 pg (ref 26.6–33.0)
MCHC: 33.7 g/dL (ref 31.5–35.7)
MCV: 92 fL (ref 79–97)
Monocytes Absolute: 0.7 x10E3/uL (ref 0.1–0.9)
Monocytes: 13 %
Neutrophils Absolute: 2.4 x10E3/uL (ref 1.4–7.0)
Neutrophils: 47 %
Platelets: 230 x10E3/uL (ref 150–450)
RBC: 4.97 x10E6/uL (ref 4.14–5.80)
RDW: 13.1 % (ref 11.6–15.4)
WBC: 5.2 x10E3/uL (ref 3.4–10.8)

## 2024-06-22 LAB — LIPID PANEL
Chol/HDL Ratio: 5.3 ratio — ABNORMAL HIGH (ref 0.0–5.0)
Cholesterol, Total: 234 mg/dL — ABNORMAL HIGH (ref 100–199)
HDL: 44 mg/dL
LDL Chol Calc (NIH): 147 mg/dL — ABNORMAL HIGH (ref 0–99)
Triglycerides: 238 mg/dL — ABNORMAL HIGH (ref 0–149)
VLDL Cholesterol Cal: 43 mg/dL — ABNORMAL HIGH (ref 5–40)

## 2024-06-22 LAB — VITAMIN D 25 HYDROXY (VIT D DEFICIENCY, FRACTURES): Vit D, 25-Hydroxy: 21.6 ng/mL — ABNORMAL LOW (ref 30.0–100.0)

## 2024-06-22 LAB — HEMOGLOBIN A1C
Est. average glucose Bld gHb Est-mCnc: 111 mg/dL
Hgb A1c MFr Bld: 5.5 % (ref 4.8–5.6)

## 2024-06-22 LAB — COMPREHENSIVE METABOLIC PANEL WITH GFR
ALT: 24 IU/L (ref 0–44)
AST: 27 IU/L (ref 0–40)
Albumin: 4.2 g/dL (ref 3.8–4.9)
Alkaline Phosphatase: 87 IU/L (ref 47–123)
BUN/Creatinine Ratio: 13 (ref 9–20)
BUN: 12 mg/dL (ref 6–24)
Bilirubin Total: 0.4 mg/dL (ref 0.0–1.2)
CO2: 23 mmol/L (ref 20–29)
Calcium: 9.8 mg/dL (ref 8.7–10.2)
Chloride: 103 mmol/L (ref 96–106)
Creatinine, Ser: 0.96 mg/dL (ref 0.76–1.27)
Globulin, Total: 2.6 g/dL (ref 1.5–4.5)
Glucose: 105 mg/dL — ABNORMAL HIGH (ref 70–99)
Potassium: 4.5 mmol/L (ref 3.5–5.2)
Sodium: 140 mmol/L (ref 134–144)
Total Protein: 6.8 g/dL (ref 6.0–8.5)
eGFR: 95 mL/min/1.73

## 2024-07-05 ENCOUNTER — Encounter (HOSPITAL_COMMUNITY): Payer: Self-pay | Admitting: Psychiatry

## 2024-07-05 ENCOUNTER — Other Ambulatory Visit (HOSPITAL_COMMUNITY): Payer: Self-pay

## 2024-07-05 ENCOUNTER — Other Ambulatory Visit: Payer: Self-pay

## 2024-07-05 ENCOUNTER — Telehealth (HOSPITAL_COMMUNITY): Admitting: Psychiatry

## 2024-07-05 VITALS — Wt 207.0 lb

## 2024-07-05 DIAGNOSIS — F319 Bipolar disorder, unspecified: Secondary | ICD-10-CM | POA: Diagnosis not present

## 2024-07-05 DIAGNOSIS — F419 Anxiety disorder, unspecified: Secondary | ICD-10-CM

## 2024-07-05 DIAGNOSIS — R251 Tremor, unspecified: Secondary | ICD-10-CM

## 2024-07-05 MED ORDER — QUETIAPINE FUMARATE 25 MG PO TABS
25.0000 mg | ORAL_TABLET | Freq: Every day | ORAL | 0 refills | Status: AC
  Filled 2024-07-05: qty 90, 90d supply, fill #0

## 2024-07-05 MED ORDER — CLONAZEPAM 0.5 MG PO TABS
0.5000 mg | ORAL_TABLET | Freq: Two times a day (BID) | ORAL | 2 refills | Status: AC
  Filled 2024-07-05: qty 75, 25d supply, fill #0

## 2024-07-05 NOTE — Progress Notes (Signed)
 " [facility] MD Virtual Progress Note   Patient Location: Home Provider Location: Home Office  I connect with patient by video and verified that I am speaking with correct person by using two identifiers. I discussed the limitations of evaluation and management by telemedicine and the availability of in person appointments. I also discussed with the patient that there may be a patient responsible charge related to this service. The patient expressed understanding and agreed to proceed.  BROADY Herrera [id number] 54 y.o.  07/05/2024 9:17 AM  History of Present Illness:  Patient is evaluated by phone session.  He could not do video session today.  He reported Christmas was quite and he is happy it went okay.  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  He is without the medication the past few days any cutting down his medication because he missed the last appointment.  He is taking clonazepam  0.5 mg as needed since he is almost out.  He admitted not taking Seroquel  every night because it makes him very groggy and sleepy but he does need to catch up his sleep after few days and when he takes it does help.  He cannot take every night.  Recently had a visit with his primary care and labs were drawn.  His cholesterol is high and he admitted not watching his diet but like to go back to his diet plan and eating salads and vegetables.  He reported his anger irritability mood swing and rate is not as bad if he takes the medication.  Like to keep his business because he has people who work for him and he does not want to close.  His tremors are not as bad but sometimes it hurts.  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  He does not like crowded places or to many people around him.  He works 7 days a week.  He does not leave the house unless it is important.  He denies  drinking or using any illegal substances.  His appetite is okay.  Weight is stable.  Past Psychiatric History: H/O briefly seeing therapist at [facility] in young age.  H/O mood swing, anger, agitation, hyperactivity and running away.  Use acid, LSD, cocaine, marijuana and heavy drinking.  Tried Zoloft, Lexapro, melatonin and Xanax did not like it. Took Paxil  and Klonopin  and Wellbutrin  from PCP. We tried Lamictal .   Past Medical History:  Diagnosis Date   Depression    High cholesterol    PFO (patent foramen ovale)    a. s/p PFO closure by Dr. Wonda on 11/19/17   Smoker    Stroke (HCC) 2019    Outpatient Encounter Medications as of 07/05/2024  Medication Sig   aspirin  EC 81 MG tablet Take 81 mg by mouth daily with lunch.    atorvastatin  (LIPITOR ) 40 MG tablet Take 1 tablet (40 mg total) by mouth daily at 6 PM.   clonazePAM  (KLONOPIN ) 0.5 MG tablet Take 1 tablet (0.5 mg total) by mouth 2 (two) times daily. May take a third tablet as needed for severe anxiety   Menthol, Topical Analgesic, (ICY HOT EX) Apply 1 application topically 3 (three) times daily as needed (pain).    Multiple Vitamin (MULTIVITAMIN WITH MINERALS) TABS tablet Take 1 tablet by mouth daily with lunch.   omeprazole  (PRILOSEC) 20 MG capsule Take 1 capsule (  20 mg total) by mouth daily.   QUEtiapine  (SEROQUEL ) 25 MG tablet Take 1 tablet (25 mg total) by mouth at bedtime.   No facility-administered encounter medications on file as of 07/05/2024.    Recent Results (from the past 2160 hours)  Vitamin D  (25 hydroxy)     Status: Abnormal   Collection Time: 06/21/24 10:04 AM  Result Value Ref Range   Vit D, 25-Hydroxy 21.6 (L) 30.0 - 100.0 ng/mL    Comment: Vitamin D  deficiency has been defined by the Institute of Medicine and an Endocrine Society practice guideline as a level of serum 25-OH vitamin D  less than 20 ng/mL (1,2). The Endocrine Society went on to further define vitamin D  insufficiency as a level between 21 and 29  ng/mL (2). 1. IOM (Institute of Medicine). 2010. Dietary reference    intakes for calcium  and D. [place]: The    [employer]. 2. Holick MF, Binkley Mason, Bischoff-Ferrari HA, et al.    Evaluation, treatment, and prevention of vitamin D     deficiency: an Endocrine Society clinical practice    guideline. JCEM. 2011 Jul; 96(7):1911-30.   Hemoglobin A1c     Status: None   Collection Time: 06/21/24 10:04 AM  Result Value Ref Range   Hgb A1c MFr Bld 5.5 4.8 - 5.6 %    Comment:          Prediabetes: 5.7 - 6.4          Diabetes: >6.4          Glycemic control for adults with diabetes: <7.0    Est. average glucose Bld gHb Est-mCnc 111 mg/dL  Lipid panel     Status: Abnormal   Collection Time: 06/21/24 10:04 AM  Result Value Ref Range   Cholesterol, Total 234 (H) 100 - 199 mg/dL   Triglycerides 761 (H) 0 - 149 mg/dL   HDL 44 >60 mg/dL   VLDL Cholesterol Cal 43 (H) 5 - 40 mg/dL   LDL Chol Calc (NIH) 852 (H) 0 - 99 mg/dL   Chol/HDL Ratio 5.3 (H) 0.0 - 5.0 ratio    Comment:                                   T. Chol/HDL Ratio                                             Men  Women                               1/2 Avg.Risk  3.4    3.3                                   Avg.Risk  5.0    4.4                                2X Avg.Risk  9.6    7.1                                3X Avg.Risk  23.4   11.0   CBC with Differential/Platelet     Status: None   Collection Time: 06/21/24 10:04 AM  Result Value Ref Range   WBC 5.2 3.4 - 10.8 x10E3/uL   RBC 4.97 4.14 - 5.80 x10E6/uL   Hemoglobin 15.4 13.0 - 17.7 g/dL   Hematocrit 54.2 62.4 - 51.0 %   MCV 92 79 - 97 fL   MCH 31.0 26.6 - 33.0 pg   MCHC 33.7 31.5 - 35.7 g/dL   RDW 86.8 88.3 - 84.5 %   Platelets 230 150 - 450 x10E3/uL   Neutrophils 47 Not Estab. %   Lymphs 36 Not Estab. %   Monocytes 13 Not Estab. %   Eos 3 Not Estab. %   Basos 1 Not Estab. %   Neutrophils Absolute 2.4 1.4 - 7.0 x10E3/uL   Lymphocytes Absolute 1.9  0.7 - 3.1 x10E3/uL   Monocytes Absolute 0.7 0.1 - 0.9 x10E3/uL   EOS (ABSOLUTE) 0.2 0.0 - 0.4 x10E3/uL   Basophils Absolute 0.1 0.0 - 0.2 x10E3/uL   Immature Granulocytes 0 Not Estab. %   Immature Grans (Abs) 0.0 0.0 - 0.1 x10E3/uL  Comprehensive metabolic panel with GFR     Status: Abnormal   Collection Time: 06/21/24 10:04 AM  Result Value Ref Range   Glucose 105 (H) 70 - 99 mg/dL   BUN 12 6 - 24 mg/dL   Creatinine, Ser 9.03 0.76 - 1.27 mg/dL   eGFR 95 >40 fO/fpw/8.26   BUN/Creatinine Ratio 13 9 - 20   Sodium 140 134 - 144 mmol/L   Potassium 4.5 3.5 - 5.2 mmol/L   Chloride 103 96 - 106 mmol/L   CO2 23 20 - 29 mmol/L   Calcium  9.8 8.7 - 10.2 mg/dL   Total Protein 6.8 6.0 - 8.5 g/dL   Albumin 4.2 3.8 - 4.9 g/dL   Globulin, Total 2.6 1.5 - 4.5 g/dL   Bilirubin Total 0.4 0.0 - 1.2 mg/dL   Alkaline Phosphatase 87 47 - 123 IU/L   AST 27 0 - 40 IU/L   ALT 24 0 - 44 IU/L     Psychiatric Specialty Exam: Physical Exam  Review of Systems  Weight 207 lb (93.9 kg).There is no height or weight on file to calculate BMI.  General Appearance: NA  Eye Contact:  NA  Speech:  Slow  Volume:  Normal  Mood:  Anxious and Dysphoric  Affect:  NA  Thought Process:  Goal Directed  Orientation:  Full (Time, Place, and Person)  Thought Content:  WDL  Suicidal Thoughts:  No  Homicidal Thoughts:  No  Memory:  Immediate;   Good Recent;   Good Remote;   Good  Judgement:  Intact  Insight:  Present  Psychomotor Activity:  NA  Concentration:  Concentration: Good and Attention Span: Good  Recall:  Good  Fund of Knowledge:  Good  Language:  Good  Akathisia:  No  Handed:  Right  AIMS (if indicated):     Assets:  Communication Skills Desire for Improvement Housing Talents/Skills Transportation  ADL's:  Intact  Cognition:  WNL  Sleep:  fair       03/22/2024   10:18 AM 09/15/2023    1:43 PM 03/10/2023   10:33 AM 10/28/2022   10:26 AM 03/11/2022   10:50 AM  Depression screen PHQ 2/9   Decreased Interest 2 0 1 2 1   Down, Depressed, Hopeless 1 0 2 1 1   PHQ - 2 Score 3 0 3 3  2  Altered sleeping 2 0 3 2 3   Tired, decreased energy 1 0 2 1 1   Change in appetite 1 0 0 1 0  Feeling bad or failure about yourself  0 0 0 0 0  Trouble concentrating 1 2 3 3 1   Moving slowly or fidgety/restless 1 2 1 1  0  Suicidal thoughts 0 0 0 0 0  PHQ-9 Score 9  4  12  11  7    Difficult doing work/chores  Somewhat difficult Somewhat difficult Not difficult at all Not difficult at all     Data saved with a previous flowsheet row definition    Assessment/Plan: Bipolar I disorder (HCC) - Plan: clonazePAM  (KLONOPIN ) 0.5 MG tablet, QUEtiapine  (SEROQUEL ) 25 MG tablet  Anxiety - Plan: clonazePAM  (KLONOPIN ) 0.5 MG tablet  Tremor - Plan: clonazePAM  (KLONOPIN ) 0.5 MG tablet  Patient is 54 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  Reviewed blood work results.  He has high cholesterol and admitted not taking the cholesterol-lowering medication and has like to focus on his diet and eating salad and vegetables.  Discussed noncompliance with medication and follow-up appointments.  Patient apologized and he like to keep the appointment in the future so he can take the medicine every day.  He does not want to change the medication because he feels current medicine is working.  Admitted not taking Seroquel  every night because it makes him very groggy but does take it 2-3 times a week which helps.  Continue clonazepam  0.5 mg up to 2-3 times to help with anxiety and manic symptoms.  Recommend to call back if is any question or any concern.  Patient is not interested in therapy.  Follow-up in 4 months.   Follow Up Instructions:     I discussed the assessment and treatment plan with the patient. The patient was provided an opportunity to ask questions and all were answered. The patient agreed with the plan and demonstrated an understanding of the  instructions.   The patient was advised to call back or seek an in-person evaluation if the symptoms worsen or if the condition fails to improve as anticipated.    Collaboration of Care: Other provider involved in patient's care AEB notes are available in epic to review  Patient/Guardian was advised Release of Information must be obtained prior to any record release in order to collaborate their care with an outside provider. Patient/Guardian was advised if they have not already done so to contact the registration department to sign all necessary forms in order for us  to release information regarding their care.   Consent: Patient/Guardian gives verbal consent for treatment and assignment of benefits for services provided during this visit. Patient/Guardian expressed understanding and agreed to proceed.     Total encounter time 17 minutes which includes face-to-face time, chart reviewed, care coordination, order entry and documentation during this encounter.   Note: This document was prepared by [employer] voice dictation technology and any errors that results from this process are unintentional.    Leni ONEIDA Client, MD 07/05/2024   "

## 2024-07-06 ENCOUNTER — Other Ambulatory Visit: Payer: Self-pay

## 2024-07-06 ENCOUNTER — Other Ambulatory Visit (HOSPITAL_COMMUNITY): Payer: Self-pay

## 2024-09-27 ENCOUNTER — Other Ambulatory Visit

## 2024-10-04 ENCOUNTER — Encounter: Admitting: Family Medicine

## 2024-10-11 ENCOUNTER — Encounter: Admitting: Family Medicine

## 2024-11-01 ENCOUNTER — Telehealth (HOSPITAL_COMMUNITY): Admitting: Psychiatry
